# Patient Record
Sex: Male | Born: 1955 | Race: White | Hispanic: No | Marital: Married | Smoking: Former smoker
Health system: Southern US, Community
[De-identification: ages and names within clinical notes are randomized; demographics above are authoritative.]

## PROBLEM LIST (undated history)

## (undated) DIAGNOSIS — C4491 Basal cell carcinoma of skin, unspecified: Secondary | ICD-10-CM

## (undated) DIAGNOSIS — D62 Acute posthemorrhagic anemia: Secondary | ICD-10-CM

## (undated) DIAGNOSIS — I10 Essential (primary) hypertension: Secondary | ICD-10-CM

## (undated) DIAGNOSIS — M726 Necrotizing fasciitis: Secondary | ICD-10-CM

## (undated) DIAGNOSIS — K5903 Drug induced constipation: Secondary | ICD-10-CM

## (undated) DIAGNOSIS — G8918 Other acute postprocedural pain: Secondary | ICD-10-CM

## (undated) DIAGNOSIS — E871 Hypo-osmolality and hyponatremia: Secondary | ICD-10-CM

## (undated) DIAGNOSIS — L02415 Cutaneous abscess of right lower limb: Secondary | ICD-10-CM

## (undated) DIAGNOSIS — R7303 Prediabetes: Secondary | ICD-10-CM

## (undated) DIAGNOSIS — U071 COVID-19: Secondary | ICD-10-CM

## (undated) DIAGNOSIS — F329 Major depressive disorder, single episode, unspecified: Secondary | ICD-10-CM

## (undated) DIAGNOSIS — S78111A Complete traumatic amputation at level between right hip and knee, initial encounter: Secondary | ICD-10-CM

## (undated) HISTORY — DX: Other acute postprocedural pain: G89.18

## (undated) HISTORY — DX: Hypo-osmolality and hyponatremia: E87.1

## (undated) HISTORY — DX: Drug induced constipation: K59.03

## (undated) HISTORY — DX: Complete traumatic amputation at level between right hip and knee, initial encounter: S78.111A

## (undated) HISTORY — DX: COVID-19: U07.1

## (undated) HISTORY — DX: Necrotizing fasciitis: M72.6

## (undated) HISTORY — DX: Cutaneous abscess of right lower limb: L02.415

## (undated) HISTORY — DX: Prediabetes: R73.03

## (undated) HISTORY — DX: Acute posthemorrhagic anemia: D62

---

## 2006-07-04 ENCOUNTER — Emergency Department (HOSPITAL_COMMUNITY): Admission: EM | Admit: 2006-07-04 | Discharge: 2006-07-04 | Payer: Self-pay | Admitting: Emergency Medicine

## 2011-07-10 DIAGNOSIS — C44519 Basal cell carcinoma of skin of other part of trunk: Secondary | ICD-10-CM | POA: Insufficient documentation

## 2011-07-10 HISTORY — DX: Basal cell carcinoma of skin of other part of trunk: C44.519

## 2011-10-24 DIAGNOSIS — J309 Allergic rhinitis, unspecified: Secondary | ICD-10-CM | POA: Insufficient documentation

## 2011-10-24 HISTORY — DX: Allergic rhinitis, unspecified: J30.9

## 2012-07-15 DIAGNOSIS — Z85828 Personal history of other malignant neoplasm of skin: Secondary | ICD-10-CM

## 2012-07-15 HISTORY — DX: Personal history of other malignant neoplasm of skin: Z85.828

## 2014-08-23 DIAGNOSIS — E669 Obesity, unspecified: Secondary | ICD-10-CM

## 2014-08-23 DIAGNOSIS — R7301 Impaired fasting glucose: Secondary | ICD-10-CM

## 2014-08-23 HISTORY — DX: Impaired fasting glucose: R73.01

## 2014-08-23 HISTORY — DX: Obesity, unspecified: E66.9

## 2015-08-16 DIAGNOSIS — H903 Sensorineural hearing loss, bilateral: Secondary | ICD-10-CM | POA: Insufficient documentation

## 2015-08-16 DIAGNOSIS — I10 Essential (primary) hypertension: Secondary | ICD-10-CM

## 2015-08-16 HISTORY — DX: Sensorineural hearing loss, bilateral: H90.3

## 2015-08-16 HISTORY — DX: Essential (primary) hypertension: I10

## 2017-07-09 DIAGNOSIS — Z7689 Persons encountering health services in other specified circumstances: Secondary | ICD-10-CM

## 2017-07-09 HISTORY — DX: Persons encountering health services in other specified circumstances: Z76.89

## 2017-10-14 DIAGNOSIS — I10 Essential (primary) hypertension: Secondary | ICD-10-CM | POA: Diagnosis not present

## 2017-10-22 DIAGNOSIS — E860 Dehydration: Secondary | ICD-10-CM | POA: Diagnosis not present

## 2017-12-11 DIAGNOSIS — I10 Essential (primary) hypertension: Secondary | ICD-10-CM | POA: Diagnosis not present

## 2018-04-01 DIAGNOSIS — R7301 Impaired fasting glucose: Secondary | ICD-10-CM | POA: Diagnosis not present

## 2018-04-01 DIAGNOSIS — Z87891 Personal history of nicotine dependence: Secondary | ICD-10-CM | POA: Diagnosis not present

## 2018-04-01 DIAGNOSIS — I10 Essential (primary) hypertension: Secondary | ICD-10-CM | POA: Diagnosis not present

## 2018-08-13 DIAGNOSIS — H25013 Cortical age-related cataract, bilateral: Secondary | ICD-10-CM | POA: Diagnosis not present

## 2018-08-13 DIAGNOSIS — H2513 Age-related nuclear cataract, bilateral: Secondary | ICD-10-CM | POA: Diagnosis not present

## 2018-08-13 DIAGNOSIS — H35373 Puckering of macula, bilateral: Secondary | ICD-10-CM | POA: Diagnosis not present

## 2018-11-07 DIAGNOSIS — R079 Chest pain, unspecified: Secondary | ICD-10-CM | POA: Diagnosis not present

## 2018-11-08 ENCOUNTER — Emergency Department (HOSPITAL_BASED_OUTPATIENT_CLINIC_OR_DEPARTMENT_OTHER): Payer: BLUE CROSS/BLUE SHIELD

## 2018-11-08 ENCOUNTER — Other Ambulatory Visit: Payer: Self-pay

## 2018-11-08 ENCOUNTER — Observation Stay (HOSPITAL_BASED_OUTPATIENT_CLINIC_OR_DEPARTMENT_OTHER)
Admission: EM | Admit: 2018-11-08 | Discharge: 2018-11-09 | Disposition: A | Discharge status: Home / Self Care | Payer: BLUE CROSS/BLUE SHIELD | Attending: Internal Medicine | Admitting: Internal Medicine

## 2018-11-08 ENCOUNTER — Encounter (HOSPITAL_BASED_OUTPATIENT_CLINIC_OR_DEPARTMENT_OTHER): Payer: Self-pay | Admitting: *Deleted

## 2018-11-08 DIAGNOSIS — R079 Chest pain, unspecified: Secondary | ICD-10-CM | POA: Diagnosis not present

## 2018-11-08 DIAGNOSIS — Z87891 Personal history of nicotine dependence: Secondary | ICD-10-CM | POA: Diagnosis not present

## 2018-11-08 DIAGNOSIS — R072 Precordial pain: Secondary | ICD-10-CM | POA: Diagnosis not present

## 2018-11-08 DIAGNOSIS — R0789 Other chest pain: Secondary | ICD-10-CM | POA: Diagnosis not present

## 2018-11-08 DIAGNOSIS — I10 Essential (primary) hypertension: Secondary | ICD-10-CM | POA: Diagnosis not present

## 2018-11-08 HISTORY — DX: Basal cell carcinoma of skin, unspecified: C44.91

## 2018-11-08 HISTORY — DX: Chest pain, unspecified: R07.9

## 2018-11-08 HISTORY — DX: Essential (primary) hypertension: I10

## 2018-11-08 LAB — CBC
HCT: 40.6 % (ref 39.0–52.0)
HCT: 39.9 % (ref 39.0–52.0)
Hemoglobin: 13.5 g/dL (ref 13.0–17.0)
Hemoglobin: 13.4 g/dL (ref 13.0–17.0)
MCH: 29.3 pg (ref 26.0–34.0)
MCH: 29.3 pg (ref 26.0–34.0)
MCHC: 33.3 g/dL (ref 30.0–36.0)
MCHC: 33.6 g/dL (ref 30.0–36.0)
MCV: 88.3 fL (ref 80.0–100.0)
MCV: 87.3 fL (ref 80.0–100.0)
Platelets: 242 10*3/uL (ref 150–400)
Platelets: 251 10*3/uL (ref 150–400)
RBC: 4.6 MIL/uL (ref 4.22–5.81)
RBC: 4.57 MIL/uL (ref 4.22–5.81)
RDW: 13.1 % (ref 11.5–15.5)
RDW: 13.2 % (ref 11.5–15.5)
WBC: 7.8 10*3/uL (ref 4.0–10.5)
WBC: 5.9 10*3/uL (ref 4.0–10.5)
nRBC: 0 % (ref 0.0–0.2)
nRBC: 0 % (ref 0.0–0.2)

## 2018-11-08 LAB — LIPID PANEL
Cholesterol: 178 mg/dL (ref 0–200)
HDL: 40 mg/dL — ABNORMAL LOW (ref >40)
LDL Cholesterol: 108 mg/dL — ABNORMAL HIGH (ref 0–99)
Total CHOL/HDL Ratio: 4.5 RATIO
Triglycerides: 152 mg/dL — ABNORMAL HIGH (ref <150)
VLDL: 30 mg/dL (ref 0–40)

## 2018-11-08 LAB — TROPONIN I
Troponin I: <0.03 ng/mL (ref <0.03)
Troponin I: <0.03 ng/mL (ref <0.03)
Troponin I: <0.03 ng/mL (ref <0.03)
Troponin I: <0.03 ng/mL (ref <0.03)

## 2018-11-08 LAB — BASIC METABOLIC PANEL
Anion gap: 7 (ref 5–15)
BUN: 14 mg/dL (ref 6–20)
CO2: 22 mmol/L (ref 22–32)
Calcium: 8.7 mg/dL — ABNORMAL LOW (ref 8.9–10.3)
Chloride: 107 mmol/L (ref 98–111)
Creatinine, Ser: 0.94 mg/dL (ref 0.61–1.24)
GFR calc Af Amer: >60 mL/min (ref >60)
GFR calc non Af Amer: >60 mL/min (ref >60)
Glucose, Bld: 116 mg/dL — ABNORMAL HIGH (ref 70–99)
Potassium: 3.5 mmol/L (ref 3.5–5.1)
Sodium: 136 mmol/L (ref 135–145)

## 2018-11-08 LAB — HEMOGLOBIN A1C
Hgb A1c MFr Bld: 5.9 % — ABNORMAL HIGH (ref 4.8–5.6)
Mean Plasma Glucose: 122.63 mg/dL

## 2018-11-08 LAB — MRSA PCR SCREENING: MRSA by PCR: NEGATIVE

## 2018-11-08 MED ORDER — MORPHINE SULFATE (PF) 2 MG/ML IV SOLN: 2.0000 mg | INTRAVENOUS | Status: DC | PRN

## 2018-11-08 MED ORDER — ASPIRIN 81 MG PO CHEW
81.0000 mg | CHEWABLE_TABLET | Freq: Every day | ORAL | Status: DC
  Administered 2018-11-09: 81 mg via ORAL
  Filled 2018-11-08: qty 1

## 2018-11-08 MED ORDER — SODIUM CHLORIDE 0.9% FLUSH
3.0000 mL | Freq: Once | INTRAVENOUS | Status: DC
  Filled 2018-11-08: qty 3

## 2018-11-08 MED ORDER — NITROGLYCERIN 0.4 MG SL SUBL
0.4000 mg | SUBLINGUAL_TABLET | SUBLINGUAL | Status: AC | PRN
  Administered 2018-11-08 (×3): 0.4 mg via SUBLINGUAL
  Filled 2018-11-08: qty 1

## 2018-11-08 MED ORDER — NITROGLYCERIN IN D5W 200-5 MCG/ML-% IV SOLN: 0.0000 ug/min | INTRAVENOUS | Status: DC

## 2018-11-08 MED ORDER — ENOXAPARIN SODIUM 40 MG/0.4ML ~~LOC~~ SOLN
40.0000 mg | SUBCUTANEOUS | Status: DC
  Administered 2018-11-08: 40 mg via SUBCUTANEOUS
  Filled 2018-11-08 (×2): qty 0.4

## 2018-11-08 MED ORDER — MORPHINE SULFATE (PF) 4 MG/ML IV SOLN
4.0000 mg | Freq: Once | INTRAVENOUS | Status: AC
  Administered 2018-11-08: 4 mg via INTRAVENOUS
  Filled 2018-11-08: qty 1

## 2018-11-08 MED ORDER — ONDANSETRON HCL 4 MG/2ML IJ SOLN: 4.0000 mg | Freq: Four times a day (QID) | INTRAMUSCULAR | Status: DC | PRN

## 2018-11-08 MED ORDER — ACETAMINOPHEN 325 MG PO TABS: 650.0000 mg | ORAL_TABLET | ORAL | Status: DC | PRN

## 2018-11-08 MED ORDER — LOSARTAN POTASSIUM 50 MG PO TABS
50.0000 mg | ORAL_TABLET | Freq: Every day | ORAL | Status: DC
  Administered 2018-11-08 – 2018-11-09 (×2): 50 mg via ORAL
  Filled 2018-11-08 (×2): qty 1

## 2018-11-08 MED ORDER — ASPIRIN 81 MG PO CHEW
324.0000 mg | CHEWABLE_TABLET | Freq: Once | ORAL | Status: AC
Start: 2018-11-08 — End: 2018-11-08
  Administered 2018-11-08: 324 mg via ORAL
  Filled 2018-11-08: qty 4

## 2018-11-08 MED ORDER — HYDRALAZINE HCL 10 MG PO TABS: 10.0000 mg | ORAL_TABLET | Freq: Four times a day (QID) | ORAL | Status: DC | PRN

## 2018-11-08 NOTE — Plan of Care (Signed)

## 2018-11-08 NOTE — Progress Notes (Signed)
New pt transferred from 2 H in a stable condition, tele-monitor resumed, admission history completed, initial assessment done, pt does not have any specific complain this time, denies chest pain and distress, ambulatory, NSR, BP maintained, aware of the hospital safety protocol, will continue to monitor the patient  Palma Holter, RN

## 2018-11-08 NOTE — ED Triage Notes (Signed)
Chest pain x 2 hours. Endorses dizziness and feeling clammy

## 2018-11-08 NOTE — ED Provider Notes (Signed)
[facility] EMERGENCY DEPARTMENT Provider Note   CSN: [id number] Arrival date & time: 11/08/18  0041    History   Chief Complaint Chief Complaint  Patient presents with  . Chest Pain    HPI Andres [name] is a 62 y.o. male.     Patient presents with central chest pain and pressure and "weight on my chest" that onset about 10:30 PM when he went to lie down.  States he had no pain prior to this.  The pain is been constant and progressively worsening.  It does not radiate to his arm, neck or back.  There is some associated shortness of breath, nausea and clamminess.  No diaphoresis.  He is never had this kind of pain in the past.  Denies any cardiac history.  No history of ulcers or acid reflux.  No abdominal pain or back pain.  States he is no longer on blood pressure medication because his blood pressure had improved and takes no medications currently.  Last stress test was about 15 years ago.  The history is provided by the patient and the spouse.  Chest Pain  Associated symptoms: nausea and shortness of breath   Associated symptoms: no abdominal pain, no dizziness, no fever, no headache, no vomiting and no weakness     Past Medical History:  Diagnosis Date  . Basal cell carcinoma   . Hypertension     There are no active problems to display for this patient.   History reviewed. No pertinent surgical history.      Home Medications    Prior to Admission medications   Not on File    Family History No family history on file.  Social History Social History   Tobacco Use  . Smoking status: Former Research scientist (life sciences)  . Smokeless tobacco: Never Used  Substance Use Topics  . Alcohol use: Not Currently    Frequency: Never  . Drug use: Never     Allergies   Patient has no known allergies.   Review of Systems Review of Systems  Constitutional: Negative for activity change, appetite change and fever.  HENT: Negative for congestion and rhinorrhea.     Respiratory: Positive for chest tightness and shortness of breath.   Cardiovascular: Positive for chest pain.  Gastrointestinal: Positive for nausea. Negative for abdominal pain and vomiting.  Genitourinary: Negative for dysuria and hematuria.  Musculoskeletal: Negative for arthralgias and myalgias.  Skin: Negative for rash.  Neurological: Negative for dizziness, weakness and headaches.   all other systems are negative except as noted in the HPI and PMH.     Physical Exam Updated Vital Signs BP (!) 201/105 (BP Location: Left Arm)   Pulse 84   Temp 97.6 F (36.4 C) (Oral)   Resp 16   Ht 5\' 11"  (1.803 m)   Wt 98.9 kg   SpO2 100%   BMI 30.40 kg/m   Physical Exam Vitals signs and nursing note reviewed.  Constitutional:      General: He is not in acute distress.    Appearance: He is well-developed.  HENT:     Head: Normocephalic and atraumatic.     Mouth/Throat:     Pharynx: No oropharyngeal exudate.  Eyes:     Conjunctiva/sclera: Conjunctivae normal.     Pupils: Pupils are equal, round, and reactive to light.  Neck:     Musculoskeletal: Normal range of motion and neck supple.     Comments: No meningismus. Cardiovascular:     Rate and Rhythm: Normal  rate and regular rhythm.     Heart sounds: Normal heart sounds. No murmur.     Comments: Equal radial pulses and grip strengths Pulmonary:     Effort: Pulmonary effort is normal. No respiratory distress.     Breath sounds: Normal breath sounds.  Abdominal:     Palpations: Abdomen is soft.     Tenderness: There is no abdominal tenderness. There is no guarding or rebound.  Musculoskeletal: Normal range of motion.        General: No tenderness.  Skin:    General: Skin is warm.     Capillary Refill: Capillary refill takes less than 2 seconds.  Neurological:     General: No focal deficit present.     Mental Status: He is alert and oriented to person, place, and time. Mental status is at baseline.     Cranial Nerves: No  cranial nerve deficit.     Motor: No abnormal muscle tone.     Coordination: Coordination normal.     Comments:  5/5 strength throughout. CN 2-12 intact.Equal grip strength.   Psychiatric:        Behavior: Behavior normal.      ED Treatments / Results  Labs (all labs ordered are listed, but only abnormal results are displayed) Labs Reviewed  BASIC METABOLIC PANEL - Abnormal; Notable for the following components:      Result Value   Glucose, Bld 116 (*)    Calcium 8.7 (*)    All other components within normal limits  CBC  TROPONIN I  TROPONIN I  TROPONIN I    EKG EKG Interpretation  Date/Time:  Sunday November 08 2018 00:48:38 EST Ventricular Rate:  66 PR Interval:  172 QRS Duration: 88 QT Interval:  386 QTC Calculation: 404 R Axis:   1 Text Interpretation:  Normal sinus rhythm Normal ECG No significant change was found Confirmed by Ezequiel Essex [phone]) on 11/08/2018 12:51:54 AM   Radiology Dg Chest 2 View  Result Date: 11/08/2018 CLINICAL DATA:  Chest pain. EXAM: CHEST - 2 VIEW COMPARISON:  07/04/2006 FINDINGS: The cardiomediastinal contours are normal. The lungs are clear. Pulmonary vasculature is normal. No consolidation, pleural effusion, or pneumothorax. No acute osseous abnormalities are seen. Calcified granuloma at the right lung base versus hepatic granuloma. IMPRESSION: No acute chest findings. Electronically Signed   By: Keith Rake M.D.   On: 11/08/2018 01:13    Procedures Procedures (including critical care time)  Medications Ordered in ED Medications  sodium chloride flush (NS) 0.9 % injection 3 mL (has no administration in time range)  aspirin chewable tablet 324 mg (has no administration in time range)  nitroGLYCERIN (NITROSTAT) SL tablet 0.4 mg (has no administration in time range)     Initial Impression / Assessment and Plan / ED Course  I have reviewed the triage vital signs and the nursing notes.  Pertinent labs & imaging results that  were available during my care of the patient were reviewed by me and considered in my medical decision making (see chart for details).       Sudden onset of left-sided chest pressure with nausea, clamminess and shortness of breath.  Concern for ACS.  EKG is sinus rhythm without acute ST changes. Patient very hypertensive and states his blood pressure is normally well controlled.  Will check bilateral blood pressures, give aspirin and nitroglycerin.  Initial troponin negative.  Blood pressure has improved with nitroglycerin. Primary care documentation reviewed which states patient's blood pressure has been uncontrolled  in the past.  Chest pain has resolved with sublingual nitroglycerin and morphine.  Will hold nitroglycerin drip at this time.  Concern for angina.  Pulmonary embolism, aortic dissection considered less likely at this time. Equal upper extremity blood pressures, grip strength and pulses.  Patient will be admitted for further evaluation of his chest pain with concern for angina.  Discussed with Dr. open at [facility] who accepts patient.  CRITICAL CARE Performed by: Ezequiel Essex Total critical care time: 35 minutes Critical care time was exclusive of separately billable procedures and treating other patients. Critical care was necessary to treat or prevent imminent or life-threatening deterioration. Critical care was time spent personally by me on the following activities: development of treatment plan with patient and/or surrogate as well as nursing, discussions with consultants, evaluation of patient's response to treatment, examination of patient, obtaining history from patient or surrogate, ordering and performing treatments and interventions, ordering and review of laboratory studies, ordering and review of radiographic studies, pulse oximetry and re-evaluation of patient's condition.  Final Clinical Impressions(s) / ED Diagnoses   Final diagnoses:  Chest  pain, unspecified type    ED Discharge Orders    None       Voris Tigert, Annie Main, MD 11/08/18 [phone]

## 2018-11-08 NOTE — ED Notes (Signed)
Pt states pain was constant at home, describes it now as intermittent. States pain came on around 2230 as he was trying to lay down to go to bed, reports associated dizziness, L arm tingling (that he is still having), SHOB and feeling "clammy".

## 2018-11-08 NOTE — ED Notes (Signed)
carelink gone with patient

## 2018-11-08 NOTE — ED Notes (Signed)
Chest pain relieved after Morphine administration. Pt instructed to notify RN if pain returns. EDP instructed to hold Nitro drip for now.

## 2018-11-08 NOTE — ED Notes (Signed)
Report to Lennox Grumbles, RN via phone at [facility]

## 2018-11-08 NOTE — H&P (Signed)
History and Physical    Andres [name] HEN:[id number] DOB: 08-24-1956 DOA: 11/08/2018  PCP: [name], MD  Patient coming from: Home.  Now transferred from outside emergency room.  I have personally briefly reviewed patient's old medical records available.   Chief Complaint: Chest pain.  HPI: Andres [name] is a 63 y.o. male with medical history significant of poorly controlled high blood pressures currently trying to control with diet and exercise but no other major medical problems presenting to the emergency room with sudden onset chest pain.  Started about 10:30 PM last night when he went to bed, left precordial, severe in intensity, constant and progressively worsening without radiation.  He had some associated shortness of breath and nausea but no other symptoms.  No headache, dizziness, syncopal episodes.  He called EMS and was transferred to ED.  Denies any cough cold, flulike symptoms, previous chest pain, history of heart problems or family history of cardiac death. Patient used to be on losartan 25 mg, recently stopped taking because he is determined to control his blood pressures with diet and exercise.  He has variable blood pressures at his primary care physician's office. ED Course: On arrival to emergency room last night, blood pressures 201/105, had vague chest pain that improved with aspirin and nitroglycerin.  2 sets of troponins are negative since then.  Arrival EKG was without ST changes.  Patient was planned to start on nitroglycerin drip, however his blood pressures fairly improved with sublingual nitroglycerin and aspirin, his chest pain improved. Patient was examined immediately on arrival to hospital, his blood pressures are 152/82.  Patient is without any chest pain.  A bedside EKG was done and reviewed by me that shows no ST-T wave changes.  Comparable to last night's EKG.  Review of Systems: As per HPI otherwise 10 point review of systems negative.    Past  Medical History:  Diagnosis Date  . Basal cell carcinoma   . Hypertension     History reviewed. No pertinent surgical history.   reports that he has quit smoking. He has never used smokeless tobacco. He reports previous alcohol use. He reports that he does not use drugs.  No Known Allergies  History reviewed. No pertinent family history.  Patient denies any family history of coronary artery disease, stroke.   Prior to Admission medications   Not on File  Losartan 25 mg was prescribed but he never taken it.  Physical Exam: Vitals:   11/08/18 0600 11/08/18 0630 11/08/18 0700 11/08/18 0730  BP: 139/80 (!) 145/90 (!) 150/81 (!) 152/82  Pulse: (!) 53 (!) 52 (!) 58 (!) 57  Resp: 14 14 16 12   Temp:      TempSrc:      SpO2: 96% 98% 95% 99%  Weight:      Height:        Constitutional: NAD, calm, comfortable Vitals:   11/08/18 0600 11/08/18 0630 11/08/18 0700 11/08/18 0730  BP: 139/80 (!) 145/90 (!) 150/81 (!) 152/82  Pulse: (!) 53 (!) 52 (!) 58 (!) 57  Resp: 14 14 16 12   Temp:      TempSrc:      SpO2: 96% 98% 95% 99%  Weight:      Height:       Eyes: PERRL, lids and conjunctivae normal ENMT: Mucous membranes are moist. Posterior pharynx clear of any exudate or lesions.Normal dentition.  Neck: normal, supple, no masses, no thyromegaly Respiratory: clear to auscultation bilaterally, no wheezing, no crackles. Normal respiratory effort.  No accessory muscle use.  Cardiovascular: Regular rate and rhythm, no murmurs / rubs / gallops. No extremity edema. 2+ pedal pulses. No carotid bruits.  Abdomen: no tenderness, no masses palpated. No hepatosplenomegaly. Bowel sounds positive.  Musculoskeletal: no clubbing / cyanosis. No joint deformity upper and lower extremities. Good ROM, no contractures. Normal muscle tone.  Skin: no rashes, lesions, ulcers. No induration Neurologic: CN 2-12 grossly intact. Sensation intact, DTR normal. Strength 5/5 in all 4.  Psychiatric: Normal judgment  and insight. Alert and oriented x 3. Normal mood.     Labs on Admission: I have personally reviewed following labs and imaging studies  CBC: Recent Labs  Lab 11/08/18 0101  WBC 7.8  HGB 13.5  HCT 40.6  MCV 88.3  PLT 229   Basic Metabolic Panel: Recent Labs  Lab 11/08/18 0101  NA 136  K 3.5  CL 107  CO2 22  GLUCOSE 116*  BUN 14  CREATININE 0.94  CALCIUM 8.7*   GFR: Estimated Creatinine Clearance: 100.1 mL/min (by C-G formula based on SCr of 0.94 mg/dL). Liver Function Tests: No results for input(s): AST, ALT, ALKPHOS, BILITOT, PROT, ALBUMIN in the last 168 hours. No results for input(s): LIPASE, AMYLASE in the last 168 hours. No results for input(s): AMMONIA in the last 168 hours. Coagulation Profile: No results for input(s): INR, PROTIME in the last 168 hours. Cardiac Enzymes: Recent Labs  Lab 11/08/18 0101 11/08/18 0449  TROPONINI <0.03 <0.03   BNP (last 3 results) No results for input(s): PROBNP in the last 8760 hours. HbA1C: No results for input(s): HGBA1C in the last 72 hours. CBG: No results for input(s): GLUCAP in the last 168 hours. Lipid Profile: No results for input(s): CHOL, HDL, LDLCALC, TRIG, CHOLHDL, LDLDIRECT in the last 72 hours. Thyroid Function Tests: No results for input(s): TSH, T4TOTAL, FREET4, T3FREE, THYROIDAB in the last 72 hours. Anemia Panel: No results for input(s): VITAMINB12, FOLATE, FERRITIN, TIBC, IRON, RETICCTPCT in the last 72 hours. Urine analysis: No results found for: COLORURINE, APPEARANCEUR, LABSPEC, PHURINE, GLUCOSEU, HGBUR, BILIRUBINUR, KETONESUR, PROTEINUR, UROBILINOGEN, NITRITE, LEUKOCYTESUR  Radiological Exams on Admission: Dg Chest 2 View  Result Date: 11/08/2018 CLINICAL DATA:  Chest pain. EXAM: CHEST - 2 VIEW COMPARISON:  07/04/2006 FINDINGS: The cardiomediastinal contours are normal. The lungs are clear. Pulmonary vasculature is normal. No consolidation, pleural effusion, or pneumothorax. No acute osseous  abnormalities are seen. Calcified granuloma at the right lung base versus hepatic granuloma. IMPRESSION: No acute chest findings. Electronically Signed   By: Keith Rake M.D.   On: 11/08/2018 01:13    EKG: Independently reviewed.  Normal sinus rhythm.  2 subsequent EKG with no ST-T wave changes.  Assessment/Plan Principal Problem:   Chest pain Active Problems:   Accelerated hypertension     1.  Chest pain: We will admit patient to the telemetry unit given severity of symptoms. Currently chest pain improved Cycle EKG and troponins. Supplemental oxygen to keep saturations more than 90%. Aspirin was given in the ER, will continue once a day aspirin. Nitroglycerin and Morphine for severe and recurrent pain. Will check A1c and fasting lipid panel for risk factor stratification. Will rule out acute coronary syndrome, heart score is 4.  Will refer for outpatient stress test if remains asymptomatic in the hospital next 24 hours.  2.  Hypertension accelerated: Blood pressures were high.  He does have poorly controlled blood pressures, however might have been aggravated by presence of pain.  I discussed with patient, will resume his losartan  with increased dose of 50 mg daily.  We will keep him on as needed antihypertensives in the hospital to keep blood pressures less than 180.  We discussed about diet exercise and lifestyle modification which patient is already trying to achieve. Given fluctuating blood pressures and episodes of very high blood pressures, he will benefit with being on blood pressure regimen and he is agreeable to it.   DVT prophylaxis: Lovenox. Code Status: Full code. Family Communication: No family at bedside. Disposition Plan: Home when is stable. Consults called: None. Admission status: Observation.   Barb Merino MD [facility] Pager [phone]  If 7PM-7AM, please contact night-coverage [email or web address] Password [facility]  11/08/2018, 9:41 AM

## 2018-11-09 ENCOUNTER — Ambulatory Visit (HOSPITAL_BASED_OUTPATIENT_CLINIC_OR_DEPARTMENT_OTHER): Payer: BLUE CROSS/BLUE SHIELD

## 2018-11-09 DIAGNOSIS — R079 Chest pain, unspecified: Secondary | ICD-10-CM

## 2018-11-09 DIAGNOSIS — I10 Essential (primary) hypertension: Secondary | ICD-10-CM | POA: Diagnosis not present

## 2018-11-09 DIAGNOSIS — Z87891 Personal history of nicotine dependence: Secondary | ICD-10-CM | POA: Diagnosis not present

## 2018-11-09 DIAGNOSIS — R0789 Other chest pain: Secondary | ICD-10-CM | POA: Diagnosis not present

## 2018-11-09 LAB — BASIC METABOLIC PANEL
Anion gap: 10 (ref 5–15)
BUN: 14 mg/dL (ref 6–20)
CO2: 22 mmol/L (ref 22–32)
Calcium: 9.1 mg/dL (ref 8.9–10.3)
Chloride: 106 mmol/L (ref 98–111)
Creatinine, Ser: 0.99 mg/dL (ref 0.61–1.24)
GFR calc Af Amer: >60 mL/min (ref >60)
GFR calc non Af Amer: >60 mL/min (ref >60)
Glucose, Bld: 121 mg/dL — ABNORMAL HIGH (ref 70–99)
Potassium: 3.9 mmol/L (ref 3.5–5.1)
Sodium: 138 mmol/L (ref 135–145)

## 2018-11-09 LAB — CBC WITH DIFFERENTIAL/PLATELET
Abs Immature Granulocytes: 0.02 10*3/uL (ref 0.00–0.07)
Basophils Absolute: 0.1 10*3/uL (ref 0.0–0.1)
Basophils Relative: 1 %
Eosinophils Absolute: 0.3 10*3/uL (ref 0.0–0.5)
Eosinophils Relative: 4 %
HCT: 43.5 % (ref 39.0–52.0)
Hemoglobin: 14.7 g/dL (ref 13.0–17.0)
Immature Granulocytes: 0 %
Lymphocytes Relative: 35 %
Lymphs Abs: 2.4 10*3/uL (ref 0.7–4.0)
MCH: 29.5 pg (ref 26.0–34.0)
MCHC: 33.8 g/dL (ref 30.0–36.0)
MCV: 87.2 fL (ref 80.0–100.0)
Monocytes Absolute: 0.4 10*3/uL (ref 0.1–1.0)
Monocytes Relative: 6 %
Neutro Abs: 3.6 10*3/uL (ref 1.7–7.7)
Neutrophils Relative %: 54 %
Platelets: 268 10*3/uL (ref 150–400)
RBC: 4.99 MIL/uL (ref 4.22–5.81)
RDW: 13.2 % (ref 11.5–15.5)
WBC: 6.7 10*3/uL (ref 4.0–10.5)
nRBC: 0 % (ref 0.0–0.2)

## 2018-11-09 LAB — ECHOCARDIOGRAM COMPLETE
Height: 71 in
Weight: 3922.42 oz

## 2018-11-09 LAB — TROPONIN I: Troponin I: <0.03 ng/mL (ref <0.03)

## 2018-11-09 MED ORDER — ASPIRIN 81 MG PO CHEW: 81.0000 mg | CHEWABLE_TABLET | Freq: Every day | ORAL | Status: DC

## 2018-11-09 MED ORDER — LOSARTAN POTASSIUM 50 MG PO TABS: 50.0000 mg | ORAL_TABLET | Freq: Every day | ORAL | 0 refills | Status: DC

## 2018-11-09 NOTE — Progress Notes (Signed)
PROGRESS NOTE    Andres [name]  RJJ:[id number] DOB: April 07, 1956 DOA: 11/08/2018 PCP: [name], MD   Brief Narrative:  Per Dr. Lillia Mountain Andres [name] is a 63 y.o. male with medical history significant of poorly controlled high blood pressures currently trying to control with diet and exercise but no other major medical problems presenting to the emergency room with sudden onset chest pain.  Started about 10:30 PM last night when he went to bed, left precordial, severe in intensity, constant and progressively worsening without radiation.  He had some associated shortness of breath and nausea but no other symptoms.  No headache, dizziness, syncopal episodes.  He called EMS and was transferred to ED.  Denies any cough cold, flulike symptoms, previous chest pain, history of heart problems or family history of cardiac death. Patient used to be on losartan 25 mg, recently stopped taking because he is determined to control his blood pressures with diet and exercise.  He has variable blood pressures at his primary care physician's office. ED Course: On arrival to emergency room last night, blood pressures 201/105, had vague chest pain that improved with aspirin and nitroglycerin.  2 sets of troponins are negative since then.  Arrival EKG was without ST changes.  Patient was planned to start on nitroglycerin drip, however his blood pressures fairly improved with sublingual nitroglycerin and aspirin, his chest pain improved. Patient was examined immediately on arrival to hospital, his blood pressures are 152/82.  Patient is without any chest pain.  A bedside EKG was done and reviewed by me that shows no ST-T wave changes.  Comparable to last night's EKG.  Assessment & Plan:   Principal Problem:   Chest pain Active Problems:   Accelerated hypertension  1 chest pain Possible etiology.  Likely secondary to accelerated hypertension.  Patient currently chest pain-free.  EKG done with no ischemic  changes noted.  Cardiac enzymes negative x3.  With LDL of 108.  Blood pressure improved on current regimen of Cozaar.  Continue aspirin.  Check a 2D echo.  If 2D echo is normal and patient with no further chest pain could likely follow-up with cardiology in the outpatient setting for outpatient stress test.  2.  Accelerated hypertension On admission patient noted to have a significantly elevated blood pressure of 201/105 on presentation to the ED.  EKG with no ST changes.  Patient noted to be trying to manage his blood pressure with diet and exercise and has been off his antihypertensive medications for over a month.  Patient placed back on losartan on the increased dose of 50 mg daily.  Blood pressure improved.  Outpatient follow-up with PCP.  DVT prophylaxis: Lovenox Code Status: Full Family Communication: Updated patient and wife at bedside. Disposition Plan: Likely home if 2D echo is normal no further chest pain with outpatient follow-up with cardiology for stress test.   Consultants:   None  Procedures:   2D echo pending  Antimicrobials:   None   Subjective: Patient laying in bed.  Denies any chest pain.  No shortness of breath.  States he feels great.  Asking whether he can go home today.  Objective: Vitals:   11/08/18 2300 11/09/18 0100 11/09/18 0300 11/09/18 0829  BP: (!) 156/95  (!) 161/84 (!) 142/83  Pulse: 68  69 72  Resp: 12 20 12 15   Temp: 98 F (36.7 C)  97.7 F (36.5 C) 97.6 F (36.4 C)  TempSrc: Oral  Oral Oral  SpO2: 98%  98%   Weight:  Height:        Intake/Output Summary (Last 24 hours) at 11/09/2018 0930 Last data filed at 11/08/2018 1800 Gross per 24 hour  Intake 480 ml  Output 600 ml  Net -120 ml   Filed Weights   11/08/18 0048 11/08/18 1518  Weight: 98.9 kg 111.2 kg    Examination:  General exam: Appears calm and comfortable  Respiratory system: Clear to auscultation. Respiratory effort normal. Cardiovascular system: S1 & S2 heard,  RRR. No JVD, murmurs, rubs, gallops or clicks. No pedal edema. Gastrointestinal system: Abdomen is nondistended, soft and nontender. No organomegaly or masses felt. Normal bowel sounds heard. Central nervous system: Alert and oriented. No focal neurological deficits. Extremities: Symmetric 5 x 5 power. Skin: No rashes, lesions or ulcers Psychiatry: Judgement and insight appear normal. Mood & affect appropriate.     Data Reviewed: I have personally reviewed following labs and imaging studies  CBC: Recent Labs  Lab 11/08/18 0101 11/08/18 1258 11/09/18 0748  WBC 7.8 5.9 6.7  NEUTROABS  --   --  3.6  HGB 13.5 13.4 14.7  HCT 40.6 39.9 43.5  MCV 88.3 87.3 87.2  PLT 242 251 706   Basic Metabolic Panel: Recent Labs  Lab 11/08/18 0101 11/09/18 0748  NA 136 138  K 3.5 3.9  CL 107 106  CO2 22 22  GLUCOSE 116* 121*  BUN 14 14  CREATININE 0.94 0.99  CALCIUM 8.7* 9.1   GFR: Estimated Creatinine Clearance: 100.7 mL/min (by C-G formula based on SCr of 0.99 mg/dL). Liver Function Tests: No results for input(s): AST, ALT, ALKPHOS, BILITOT, PROT, ALBUMIN in the last 168 hours. No results for input(s): LIPASE, AMYLASE in the last 168 hours. No results for input(s): AMMONIA in the last 168 hours. Coagulation Profile: No results for input(s): INR, PROTIME in the last 168 hours. Cardiac Enzymes: Recent Labs  Lab 11/08/18 0101 11/08/18 0449 11/08/18 1258 11/08/18 1837 11/09/18 0031  TROPONINI <0.03 <0.03 <0.03 <0.03 <0.03   BNP (last 3 results) No results for input(s): PROBNP in the last 8760 hours. HbA1C: Recent Labs    11/08/18 1258  HGBA1C 5.9*   CBG: No results for input(s): GLUCAP in the last 168 hours. Lipid Profile: Recent Labs    11/08/18 1258  CHOL 178  HDL 40*  LDLCALC 108*  TRIG 152*  CHOLHDL 4.5   Thyroid Function Tests: No results for input(s): TSH, T4TOTAL, FREET4, T3FREE, THYROIDAB in the last 72 hours. Anemia Panel: No results for input(s):  VITAMINB12, FOLATE, FERRITIN, TIBC, IRON, RETICCTPCT in the last 72 hours. Sepsis Labs: No results for input(s): PROCALCITON, LATICACIDVEN in the last 168 hours.  Recent Results (from the past 240 hour(s))  MRSA PCR Screening     Status: None   Collection Time: 11/08/18  9:10 AM  Result Value Ref Range Status   MRSA by PCR NEGATIVE NEGATIVE Final    Comment:        The GeneXpert MRSA Assay (FDA approved for NASAL specimens only), is one component of a comprehensive MRSA colonization surveillance program. It is not intended to diagnose MRSA infection nor to guide or monitor treatment for MRSA infections. Performed at [facility], [place] [address] [place], [place] [address]          Radiology Studies: Dg Chest 2 View  Result Date: 11/08/2018 CLINICAL DATA:  Chest pain. EXAM: CHEST - 2 VIEW COMPARISON:  07/04/2006 FINDINGS: The cardiomediastinal contours are normal. The lungs are clear. Pulmonary vasculature is normal.  No consolidation, pleural effusion, or pneumothorax. No acute osseous abnormalities are seen. Calcified granuloma at the right lung base versus hepatic granuloma. IMPRESSION: No acute chest findings. Electronically Signed   By: Keith Rake M.D.   On: 11/08/2018 01:13        Scheduled Meds: . aspirin  81 mg Oral Daily  . enoxaparin (LOVENOX) injection  40 mg Subcutaneous Q24H  . losartan  50 mg Oral Daily  . sodium chloride flush  3 mL Intravenous Once   Continuous Infusions:   LOS: 0 days    Time spent: 35 minutes    Irine Seal, MD [facility]  If 7PM-7AM, please contact night-coverage [email or web address] 11/09/2018, 9:30 AM

## 2018-11-09 NOTE — Progress Notes (Signed)
Discharge instructions given. Pt verbalized understanding and all questions were answered.  

## 2018-11-09 NOTE — Progress Notes (Signed)
  Echocardiogram 2D Echocardiogram has been performed.  Andres [name] 11/09/2018, 11:16 AM

## 2018-11-09 NOTE — Plan of Care (Signed)
  Problem: Health Behavior/Discharge Planning: Goal: Ability to manage health-related needs will improve Outcome: Completed/Met   Problem: Clinical Measurements: Goal: Ability to maintain clinical measurements within normal limits will improve Outcome: Completed/Met Goal: Will remain free from infection Outcome: Completed/Met Goal: Diagnostic test results will improve Outcome: Adequate for Discharge Goal: Respiratory complications will improve Outcome: Not Applicable Goal: Cardiovascular complication will be avoided Outcome: Adequate for Discharge   Problem: Activity: Goal: Risk for activity intolerance will decrease Outcome: Completed/Met   Problem: Nutrition: Goal: Adequate nutrition will be maintained Outcome: Completed/Met   Problem: Coping: Goal: Level of anxiety will decrease Outcome: Not Applicable   Problem: Elimination: Goal: Will not experience complications related to bowel motility Outcome: Completed/Met Goal: Will not experience complications related to urinary retention Outcome: Completed/Met   Problem: Pain Managment: Goal: General experience of comfort will improve Outcome: Adequate for Discharge   Problem: Safety: Goal: Ability to remain free from injury will improve Outcome: Completed/Met   Problem: Skin Integrity: Goal: Risk for impaired skin integrity will decrease Outcome: Completed/Met  VSS. Completed ECHO, awaiting results for possibly DC today. Spouse at bedside, pt pain free. Will continue to monitor until time of DC.

## 2018-11-09 NOTE — Discharge Summary (Signed)
Physician Discharge Summary  Andres [name] XTG:[id number] DOB: July 06, 1956 DOA: 11/08/2018  PCP: [name], MD  Admit date: 11/08/2018 Discharge date: 11/09/2018  Time spent: 50 minutes  Recommendations for Outpatient Follow-up:  1. Follow-up with [name], MD in 2 weeks.  On follow-up patient's blood pressure will need to be reassessed. 2. Follow-up with Dr. [name], cardiology on 11/18/2018 for probable outpatient stress test and further evaluation of chest pain.   Discharge Diagnoses:  Principal Problem:   Chest pain Active Problems:   Accelerated hypertension   Discharge Condition: Stable and improved  Diet recommendation: Heart healthy  Filed Weights   11/08/18 0048 11/08/18 1518  Weight: 98.9 kg 111.2 kg    History of present illness:  Per Dr. Lily Lovings [name] is a 63 y.o. male with medical history significant of poorly controlled high blood pressures currently trying to control with diet and exercise but no other major medical problems presented to the emergency room with sudden onset chest pain.  Started about 10:30 PM last night when he went to bed, left precordial, severe in intensity, constant and progressively worsening without radiation.  He had some associated shortness of breath and nausea but no other symptoms.  No headache, dizziness, syncopal episodes.  He called EMS and was transferred to ED.  Denies any cough cold, flulike symptoms, previous chest pain, history of heart problems or family history of cardiac death. Patient used to be on losartan 25 mg, recently stopped taking because he is determined to control his blood pressures with diet and exercise.  He has variable blood pressures at his primary care physician's office. ED Course: On arrival to emergency room last night, blood pressures 201/105, had vague chest pain that improved with aspirin and nitroglycerin.  2 sets of troponins are negative since then.  Arrival EKG was without ST changes.   Patient was planned to start on nitroglycerin drip, however his blood pressures fairly improved with sublingual nitroglycerin and aspirin, his chest pain improved. Patient was examined immediately on arrival to hospital, his blood pressures are 152/82.  Patient is without any chest pain.  A bedside EKG was done and reviewed by me that shows no ST-T wave changes.  Comparable to last night's EKG.  Hospital Course:  1 chest pain ?? etiology.  Likely secondary to accelerated hypertension.  Patient was admitted placed on an increased dose of his home regimen of losartan.  Patient was placed on telemetry. EKG done with no ischemic changes noted.  Cardiac enzymes negative x3.    Fasting lipid panel with LDL of 108.  Blood pressure improved on regimen of Cozaar.    Patient also placed on aspirin.  Patient improved clinically during the hospitalization and had no further chest pain.  2D echo was done with a EF of 55 to 60%, mildly increased LVH, left ventricular diastolic Doppler parameters consistent with impaired relaxation.  No evidence of left ventricular wall motion abnormalities.  Patient had no further chest pain for the rest of the hospitalization and be discharged home in stable and improved condition.  Patient will follow-up with cardiology in the outpatient setting for outpatient stress test and further evaluation.  Patient will discharged home in stable and improved condition.   2.  Accelerated hypertension On admission patient noted to have a significantly elevated blood pressure of 201/105 on presentation to the ED.  EKG with no ST changes.  Patient noted to be trying to manage his blood pressure with diet and exercise and has been off  his antihypertensive medications for over a month.  Patient placed back on losartan at an increased dose of 50 mg daily.  Blood pressure improved.  Outpatient follow-up with PCP.  Procedures:  2D echo 11/09/2018  Chest x-ray  11/08/2018  Consultations:  None  Discharge Exam: Vitals:   11/09/18 0829 11/09/18 1426  BP: (!) 142/83 (!) 153/93  Pulse: 72   Resp: 15   Temp: 97.6 F (36.4 C) 98.1 F (36.7 C)  SpO2:      General: NAD Cardiovascular: RRR Respiratory: CTAB  Discharge Instructions   Discharge Instructions    Diet - low sodium heart healthy   Complete by:  As directed    Increase activity slowly   Complete by:  As directed      Allergies as of 11/09/2018   No Known Allergies     Medication List    TAKE these medications   aspirin 81 MG chewable tablet Chew 1 tablet (81 mg total) by mouth daily. Start taking on:  November 10, 2018   losartan 50 MG tablet Commonly known as:  COZAAR Take 1 tablet (50 mg total) by mouth daily. Start taking on:  November 10, 2018      No Known Allergies Follow-up Information    [name], MD Follow up on 11/18/2018.   Specialty:  Cardiology Why:  at 9:40am for your follow up appt.  Contact information: [address] [place] [address] [phone]        [name], MD. Schedule an appointment as soon as possible for a visit in 2 week(s).   Specialty:  Family Medicine Contact information: [address] [place] [address] [phone]            The results of significant diagnostics from this hospitalization (including imaging, microbiology, ancillary and laboratory) are listed below for reference.    Significant Diagnostic Studies: Dg Chest 2 View  Result Date: 11/08/2018 CLINICAL DATA:  Chest pain. EXAM: CHEST - 2 VIEW COMPARISON:  07/04/2006 FINDINGS: The cardiomediastinal contours are normal. The lungs are clear. Pulmonary vasculature is normal. No consolidation, pleural effusion, or pneumothorax. No acute osseous abnormalities are seen. Calcified granuloma at the right lung base versus hepatic granuloma. IMPRESSION: No acute chest findings. Electronically Signed   By: Keith Rake  M.D.   On: 11/08/2018 01:13    Microbiology: Recent Results (from the past 240 hour(s))  MRSA PCR Screening     Status: None   Collection Time: 11/08/18  9:10 AM  Result Value Ref Range Status   MRSA by PCR NEGATIVE NEGATIVE Final    Comment:        The GeneXpert MRSA Assay (FDA approved for NASAL specimens only), is one component of a comprehensive MRSA colonization surveillance program. It is not intended to diagnose MRSA infection nor to guide or monitor treatment for MRSA infections. Performed at [facility], [address] [place], [place] [address]      Labs: Basic Metabolic Panel: Recent Labs  Lab 11/08/18 0101 11/09/18 0748  NA 136 138  K 3.5 3.9  CL 107 106  CO2 22 22  GLUCOSE 116* 121*  BUN 14 14  CREATININE 0.94 0.99  CALCIUM 8.7* 9.1   Liver Function Tests: No results for input(s): AST, ALT, ALKPHOS, BILITOT, PROT, ALBUMIN in the last 168 hours. No results for input(s): LIPASE, AMYLASE in the last 168 hours. No results for input(s): AMMONIA in the last 168 hours. CBC:  Recent Labs  Lab 11/08/18 0101 11/08/18 1258 11/09/18 0748  WBC 7.8 5.9 6.7  NEUTROABS  --   --  3.6  HGB 13.5 13.4 14.7  HCT 40.6 39.9 43.5  MCV 88.3 87.3 87.2  PLT 242 251 268   Cardiac Enzymes: Recent Labs  Lab 11/08/18 0101 11/08/18 0449 11/08/18 1258 11/08/18 1837 11/09/18 0031  TROPONINI <0.03 <0.03 <0.03 <0.03 <0.03   BNP: BNP (last 3 results) No results for input(s): BNP in the last 8760 hours.  ProBNP (last 3 results) No results for input(s): PROBNP in the last 8760 hours.  CBG: No results for input(s): GLUCAP in the last 168 hours.     Signed:  Irine Seal MD.  [facility] 11/09/2018, 5:06 PM

## 2018-11-12 ENCOUNTER — Telehealth: Payer: Self-pay

## 2018-11-12 NOTE — Telephone Encounter (Addendum)
Per Dr.Acharya.new pt appt scheduled incorrectly in last slot of a quarter day. Pt scheduled on 3/18 @ 9:40am for a new pt 74min appt. Called the pt. lmtcb to reschedule. Possible appt date and time to offer the pt 3/18 @ 8am, 3/23 @ 9:20am or 2pm.

## 2018-11-13 NOTE — Telephone Encounter (Signed)
Pt wife sts that they have rescheduled the pt appt with DR.Revankar @ the [facility] on for 11/24/18. The pt lives in [place] and that location is more convenient. Pt wife voiced appreciation for the call.

## 2018-11-18 ENCOUNTER — Ambulatory Visit: Payer: BLUE CROSS/BLUE SHIELD | Admitting: Internal Medicine

## 2018-11-23 ENCOUNTER — Telehealth: Payer: Self-pay

## 2018-11-23 NOTE — Telephone Encounter (Signed)
Called patient to discuss upcoming appt. Wife Marliss Coots feels that he is currently asymptomatic and does not have urgency to see cardiology. Patient is scheduled to see NP on 11/25/2018 and will follow up if cardiology is still needed. No further questions at this time.

## 2018-11-24 ENCOUNTER — Ambulatory Visit: Payer: BLUE CROSS/BLUE SHIELD | Admitting: Cardiology

## 2018-11-25 ENCOUNTER — Ambulatory Visit: Payer: BLUE CROSS/BLUE SHIELD | Admitting: Medical

## 2018-11-26 ENCOUNTER — Telehealth: Payer: Self-pay

## 2018-11-26 NOTE — Telephone Encounter (Signed)
Patient was bumped for stress testing rescheduled for follow up appt

## 2019-01-26 ENCOUNTER — Ambulatory Visit (INDEPENDENT_AMBULATORY_CARE_PROVIDER_SITE_OTHER): Payer: BLUE CROSS/BLUE SHIELD | Admitting: Medical

## 2019-01-26 ENCOUNTER — Encounter: Payer: Self-pay | Admitting: Medical

## 2019-01-26 VITALS — BP 136/75 | HR 69 | Temp 98.7°F | Ht 71.0 in | Wt 215.0 lb

## 2019-01-26 DIAGNOSIS — I10 Essential (primary) hypertension: Secondary | ICD-10-CM | POA: Diagnosis not present

## 2019-01-26 DIAGNOSIS — E785 Hyperlipidemia, unspecified: Secondary | ICD-10-CM | POA: Diagnosis not present

## 2019-01-26 DIAGNOSIS — Z125 Encounter for screening for malignant neoplasm of prostate: Secondary | ICD-10-CM

## 2019-01-26 MED ORDER — LOSARTAN POTASSIUM 50 MG PO TABS: 50.0000 mg | ORAL_TABLET | Freq: Every day | ORAL | 1 refills | Status: DC

## 2019-01-26 NOTE — Patient Instructions (Addendum)
Bp well controlled. Continue losartan. Filled rx today.  For high cholesterol, put in order for cmp and lipid panel today. Get scheduled for that.  Also ordered screening psa.  Follow up with cardiologist early June. But with me in office last week of august or first week of September.

## 2019-01-26 NOTE — Progress Notes (Signed)
Subjective:    Patient ID: Andres [name], male    DOB: July 20, 1956, 63 y.o.   MRN: [id number]  HPI  .Virtual Visit via Video Note  I connected with [name] on 01/26/19 at  1:00 PM EDT by a video enabled telemedicine application and verified that I am speaking with the correct person using two identifiers.  Location: Patient: home Provider: home.   I discussed the limitations of evaluation and management by telemedicine and the availability of in person appointments. The patient expressed understanding and agreed to proceed.    History of Present Illness:   Pt first time with our office. Pt works Air traffic controller. Pt states he eats healthy presently. On dash diet. Non smoker. Married-   Pt has htn. Was on losartan since March, 2020. BP controlled since on med.  Also started aspirin in march.  Pt states no chest pain since march, 2020. Pain started while he was sleeping. He was evaluated at ED. 2 negative troponin. He got nitroglycerin and morphine and chest pain went away. Pt had been exercising every day for about 1 hour and half before pandemic. He was going to [employer].(No symptoms of heart burn/gerd). Pt states pain was constant for about 2 hours. Just had mid chest pressure when he had those symptoms. Felt like someone sitting on chest.  Pt has appointment with Dr. Cherrie Gauze in June   The 10-year ASCVD risk score [name] DC [name]., et al., 2013) is: 11.7%   Values used to calculate the score:     Age: 72 years     Sex: Male     Is Non-Hispanic African American: No     Diabetic: No     Tobacco smoker: No     Systolic Blood Pressure: 673 mmHg     Is BP treated: Yes     HDL Cholesterol: 40 mg/dL     Total Cholesterol: 178 mg/dL    Stopped smoking 35 years ago. Smoke 2-3 years pack would last 2-3 weeks.  Pt no family history of MI or strokes except for mother with very small stroke in mother in her late 37's.      Observations/Objective: Video  worked fine for 5 minutes but then audio went out.(so exam based on when video and audio worked) then had to finish by phone due to audio failure.  General-no acute distress, pleasant, oriented. Lungs- on inspection lungs appear unlabored. Neck- no tracheal deviation or jvd on inspection. Neuro- gross motor function appears intact.   Assessment and Plan: Bp well controlled. Continue losartan. Filled rx today.  For high cholesterol, put in order for cmp and lipid panel today. Get scheduled for that.  Also ordered screening psa.  Follow up with cardiologist early June. But with me in office last week of august or first week of September.  20 minutes spent with new pt. 50% of time spent explaining on plan going forward.  Follow Up Instructions:    I discussed the assessment and treatment plan with the patient. The patient was provided an opportunity to ask questions and all were answered. The patient agreed with the plan and demonstrated an understanding of the instructions.   The patient was advised to call back or seek an in-person evaluation if the symptoms worsen or if the condition fails to improve as anticipated.     Mackie Pai, PA-C   Review of Systems  Constitutional: Negative for chills, fatigue and fever.  Respiratory: Negative for cough, chest tightness, shortness of  breath and wheezing.   Cardiovascular: Negative for chest pain and palpitations.  Gastrointestinal: Negative for abdominal pain.  Musculoskeletal: Negative for back pain and gait problem.  Neurological: Negative for dizziness, speech difficulty, weakness, numbness and headaches.  Hematological: Negative for adenopathy. Does not bruise/bleed easily.  Psychiatric/Behavioral: Negative for behavioral problems and confusion.   Past Medical History:  Diagnosis Date  . Basal cell carcinoma   . Hypertension      Social History   Socioeconomic History  . Marital status: Married    Spouse name: Not  on file  . Number of children: Not on file  . Years of education: Not on file  . Highest education level: Not on file  Occupational History  . Not on file  Social Needs  . Financial resource strain: Not on file  . Food insecurity:    Worry: Not on file    Inability: Not on file  . Transportation needs:    Medical: Not on file    Non-medical: Not on file  Tobacco Use  . Smoking status: Former Research scientist (life sciences)  . Smokeless tobacco: Never Used  Substance and Sexual Activity  . Alcohol use: Not Currently    Frequency: Never  . Drug use: Never  . Sexual activity: Yes  Lifestyle  . Physical activity:    Days per week: Not on file    Minutes per session: Not on file  . Stress: Not on file  Relationships  . Social connections:    Talks on phone: Not on file    Gets together: Not on file    Attends religious service: Not on file    Active member of club or organization: Not on file    Attends meetings of clubs or organizations: Not on file    Relationship status: Not on file  . Intimate partner violence:    Fear of current or ex partner: Not on file    Emotionally abused: Not on file    Physically abused: Not on file    Forced sexual activity: Not on file  Other Topics Concern  . Not on file  Social History Narrative  . Not on file    No past surgical history on file.  No family history on file.  Allergies  Allergen Reactions  . Niacin And Related Shortness Of Breath    Current Outpatient Medications on File Prior to Visit  Medication Sig Dispense Refill  . aspirin 81 MG chewable tablet Chew 1 tablet (81 mg total) by mouth daily.     No current facility-administered medications on file prior to visit.     BP 136/75   Pulse 69   Temp 98.7 F (37.1 C)   Ht 5\' 11"  (1.803 m)   Wt 215 lb (97.5 kg)   BMI 29.99 kg/m       Objective:   Physical Exam        Assessment & Plan:

## 2019-03-09 ENCOUNTER — Ambulatory Visit: Payer: BLUE CROSS/BLUE SHIELD | Admitting: Cardiology

## 2019-03-17 ENCOUNTER — Other Ambulatory Visit: Payer: Self-pay

## 2019-03-17 ENCOUNTER — Encounter: Payer: Self-pay | Admitting: Cardiology

## 2019-03-17 ENCOUNTER — Ambulatory Visit: Payer: BLUE CROSS/BLUE SHIELD | Admitting: Cardiology

## 2019-03-17 VITALS — BP 140/78 | HR 77 | Ht 71.0 in | Wt 247.0 lb

## 2019-03-17 DIAGNOSIS — I10 Essential (primary) hypertension: Secondary | ICD-10-CM | POA: Diagnosis not present

## 2019-03-17 DIAGNOSIS — E669 Obesity, unspecified: Secondary | ICD-10-CM

## 2019-03-17 DIAGNOSIS — R079 Chest pain, unspecified: Secondary | ICD-10-CM

## 2019-03-17 NOTE — Patient Instructions (Addendum)
Medication Instructions:  Your physician recommends that you continue on your current medications as directed. Please refer to the Current Medication list given to you today.  If you need a refill on your cardiac medications before your next appointment, please call your pharmacy.   Lab work: NONE If you have labs (blood work) drawn today and your tests are completely normal, you will receive your results only by: Marland Kitchen MyChart Message (if you have MyChart) OR . A paper copy in the mail If you have any lab test that is abnormal or we need to change your treatment, we will call you to review the results.  Testing/Procedures: YOU HAVE been scheduled to have a Coronary calcium score performed on                                 At [facility]. THIS PROCEDURE has a $150 copay at time of arrival.   Follow-Up: At [facility], you and your health needs are our priority.  As part of our continuing mission to provide you with exceptional heart care, we have created designated Provider Care Teams.  These Care Teams include your primary Cardiologist (physician) and Advanced Practice Providers (APPs -  Physician Assistants and Nurse Practitioners) who all work together to provide you with the care you need, when you need it. You will need a follow up appointment in 4 months.     Any Other Special Instructions Will Be Listed Below    Coronary Calcium Scan A coronary calcium scan is an imaging test used to look for deposits of calcium and other fatty materials (plaques) in the inner lining of the blood vessels of the heart (coronary arteries). These deposits of calcium and plaques can partly clog and narrow the coronary arteries without producing any symptoms or warning signs. This puts a person at risk for a heart attack. This test can detect these deposits before symptoms develop. Tell a health care provider about:  Any allergies you have.  All medicines you are taking, including vitamins, herbs,  eye drops, creams, and over-the-counter medicines.  Any problems you or family members have had with anesthetic medicines.  Any blood disorders you have.  Any surgeries you have had.  Any medical conditions you have.  Whether you are pregnant or may be pregnant. What are the risks? Generally, this is a safe procedure. However, problems may occur, including:  Harm to a pregnant woman and her unborn baby. This test involves the use of radiation. Radiation exposure can be dangerous to a pregnant woman and her unborn baby. If you are pregnant, you generally should not have this procedure done.  Slight increase in the risk of cancer. This is because of the radiation involved in the test. What happens before the procedure? No preparation is needed for this procedure. What happens during the procedure?   You will undress and remove any jewelry around your neck or chest.  You will put on a hospital gown.  Sticky electrodes will be placed on your chest. The electrodes will be connected to an electrocardiogram (ECG) machine to record a tracing of the electrical activity of your heart.  A CT scanner will take pictures of your heart. During this time, you will be asked to lie still and hold your breath for 2-3 seconds while a picture of your heart is being taken. The procedure may vary among health care providers and hospitals. What happens after  the procedure?  You can get dressed.  You can return to your normal activities.  It is up to you to get the results of your test. Ask your health care provider, or the department that is doing the test, when your results will be ready. Summary  A coronary calcium scan is an imaging test used to look for deposits of calcium and other fatty materials (plaques) in the inner lining of the blood vessels of the heart (coronary arteries).  Generally, this is a safe procedure. Tell your health care provider if you are pregnant or may be pregnant.  No  preparation is needed for this procedure.  A CT scanner will take pictures of your heart.  You can return to your normal activities after the scan is done. This information is not intended to replace advice given to you by your health care provider. Make sure you discuss any questions you have with your health care provider. Document Released: 02/15/2008 Document Revised: 08/01/2017 Document Reviewed: 07/08/2016 Elsevier Patient Education  2020 [employer].

## 2019-03-17 NOTE — Progress Notes (Signed)
Cardiology Office Note:    Date:  03/17/2019   ID:  Andres [name], DOB [birth year], MRN [id number]  PCP:  Mackie Pai, PA-C  Cardiologist:  Jenean Lindau, MD   Referring MD: [name], MD    ASSESSMENT:    1. Chest pain, unspecified type   2. Essential hypertension   3. Obesity (BMI 30-39.9)    PLAN:    In order of problems listed above:  1. Chest discomfort: This is very atypical for coronary etiology.  I have made a detailed description of his symptoms.  I reassured him about these findings.  That does not mean he may not have nonobstructive coronary artery disease and risk stratification would be a good idea.  I suggested calcium scoring and he is agreeable with him and his wife on the phone extensively. 2. Obesity: Risks of obesity explained.  He understood.  He has gained about 25 pounds in the past 3 months due to the viral pandemic situation and being sedentary.  I cautioned him against this.  I also discussed with him about his lipids and his hemoglobin A1c and diet was encouraged strongly. 3. Essential hypertension: Blood pressure stable.  He will keep a track of it. 4. Patient will be seen in follow-up appointment in 6 months or earlier if the patient has any concerns    Medication Adjustments/Labs and Tests Ordered: Current medicines are reviewed at length with the patient today.  Concerns regarding medicines are outlined above.  Orders Placed This Encounter  Procedures   CT CARDIAC SCORING   No orders of the defined types were placed in this encounter.    History of Present Illness:    Andres [name] is a 63 y.o. male who is being seen today for the evaluation of chest discomfort at the request of [name], MD.  Pleasant 63 year old male.  In the month of March he had an episode of chest discomfort and went to the emergency room.  I reviewed all those records.  The patient mentions to me that he was given nitroglycerin and this did not relieve  his symptoms.  Subsequently he was treated and released.  He was told to get stress testing but he did not do this because of the COVID-19 situation.  Since then he started walking on a regular basis.  Even the last week and he walked 45 minutes with his dog at the park without any problem.  No chest pain orthopnea or PND.  For precaution sake he wanted to come and see me.  At the time of my evaluation, the patient is alert awake oriented and in no distress.  Past Medical History:  Diagnosis Date   Basal cell carcinoma    Hypertension     History reviewed. No pertinent surgical history.  Current Medications: Current Meds  Medication Sig   Ascorbic Acid (VITAMIN C) 500 MG CHEW Chew 1 tablet by mouth daily.   aspirin 81 MG chewable tablet Chew 1 tablet (81 mg total) by mouth daily.   B Complex Vitamins (B-COMPLEX/B-12) TABS Take 1 tablet by mouth daily.   losartan (COZAAR) 50 MG tablet Take 1 tablet (50 mg total) by mouth daily.   Omega-3 Fatty Acids (FISH OIL) 1000 MG CAPS Take 1 capsule by mouth daily.     Allergies:   Niacin and related   Social History   Socioeconomic History   Marital status: Married    Spouse name: Not on file   Number of children: Not on  file   Years of education: Not on file   Highest education level: Not on file  Occupational History   Not on file  Social Needs   Financial resource strain: Not on file   Food insecurity    Worry: Not on file    Inability: Not on file   Transportation needs    Medical: Not on file    Non-medical: Not on file  Tobacco Use   Smoking status: Former Smoker   Smokeless tobacco: Never Used  Substance and Sexual Activity   Alcohol use: Not Currently    Frequency: Never   Drug use: Never   Sexual activity: Yes  Lifestyle   Physical activity    Days per week: Not on file    Minutes per session: Not on file   Stress: Not on file  Relationships   Social connections    Talks on phone: Not on file     Gets together: Not on file    Attends religious service: Not on file    Active member of club or organization: Not on file    Attends meetings of clubs or organizations: Not on file    Relationship status: Not on file  Other Topics Concern   Not on file  Social History Narrative   Not on file     Family History: The patient's family history is not on file.  ROS:   Please see the history of present illness.    All other systems reviewed and are negative.  EKGs/Labs/Other Studies Reviewed:    The following studies were reviewed today: EKG reveals sinus rhythm and nonspecific ST-T changes and was done in the ED in the ER   Recent Labs: 11/09/2018: BUN 14; Creatinine, Ser 0.99; Hemoglobin 14.7; Platelets 268; Potassium 3.9; Sodium 138  Recent Lipid Panel    Component Value Date/Time   CHOL 178 11/08/2018 1258   TRIG 152 (H) 11/08/2018 1258   HDL 40 (L) 11/08/2018 1258   CHOLHDL 4.5 11/08/2018 1258   VLDL 30 11/08/2018 1258   LDLCALC 108 (H) 11/08/2018 1258    Physical Exam:    VS:  BP 140/78 (BP Location: Left Arm, Patient Position: Sitting, Cuff Size: Normal)    Pulse 77    Ht 5\' 11"  (1.803 m)    Wt 247 lb (112 kg)    SpO2 99%    BMI 34.45 kg/m     Wt Readings from Last 3 Encounters:  03/17/19 247 lb (112 kg)  01/26/19 215 lb (97.5 kg)  11/08/18 245 lb 2.4 oz (111.2 kg)     GEN: Patient is in no acute distress HEENT: Normal NECK: No JVD; No carotid bruits LYMPHATICS: No lymphadenopathy CARDIAC: S1 S2 regular, 2/6 systolic murmur at the apex. RESPIRATORY:  Clear to auscultation without rales, wheezing or rhonchi  ABDOMEN: Soft, non-tender, non-distended MUSCULOSKELETAL:  No edema; No deformity  SKIN: Warm and dry NEUROLOGIC:  Alert and oriented x 3 PSYCHIATRIC:  Normal affect    Signed, Jenean Lindau, MD  03/17/2019 8:41 AM    [place]

## 2019-04-05 ENCOUNTER — Other Ambulatory Visit: Payer: Self-pay

## 2019-04-05 ENCOUNTER — Other Ambulatory Visit (INDEPENDENT_AMBULATORY_CARE_PROVIDER_SITE_OTHER): Payer: BLUE CROSS/BLUE SHIELD

## 2019-04-05 DIAGNOSIS — Z125 Encounter for screening for malignant neoplasm of prostate: Secondary | ICD-10-CM | POA: Diagnosis not present

## 2019-04-05 DIAGNOSIS — I10 Essential (primary) hypertension: Secondary | ICD-10-CM | POA: Diagnosis not present

## 2019-04-05 DIAGNOSIS — E785 Hyperlipidemia, unspecified: Secondary | ICD-10-CM | POA: Diagnosis not present

## 2019-04-05 LAB — LIPID PANEL
Cholesterol: 161 mg/dL (ref 0–200)
HDL: 38.7 mg/dL — ABNORMAL LOW (ref >39.00)
LDL Cholesterol: 94 mg/dL (ref 0–99)
NonHDL: 122.08
Total CHOL/HDL Ratio: 4
Triglycerides: 139 mg/dL (ref 0.0–149.0)
VLDL: 27.8 mg/dL (ref 0.0–40.0)

## 2019-04-05 LAB — COMPREHENSIVE METABOLIC PANEL
ALT: 12 U/L (ref 0–53)
AST: 12 U/L (ref 0–37)
Albumin: 3.8 g/dL (ref 3.5–5.2)
Alkaline Phosphatase: 53 U/L (ref 39–117)
BUN: 17 mg/dL (ref 6–23)
CO2: 25 mEq/L (ref 19–32)
Calcium: 9.2 mg/dL (ref 8.4–10.5)
Chloride: 102 mEq/L (ref 96–112)
Creatinine, Ser: 0.91 mg/dL (ref 0.40–1.50)
GFR: 84.73 mL/min (ref >60.00)
Glucose, Bld: 89 mg/dL (ref 70–99)
Potassium: 4.2 mEq/L (ref 3.5–5.1)
Sodium: 136 mEq/L (ref 135–145)
Total Bilirubin: 0.6 mg/dL (ref 0.2–1.2)
Total Protein: 6.7 g/dL (ref 6.0–8.3)

## 2019-04-05 LAB — PSA: PSA: 3.6 ng/mL (ref 0.10–4.00)

## 2019-04-06 ENCOUNTER — Telehealth: Payer: Self-pay | Admitting: Medical

## 2019-04-06 ENCOUNTER — Encounter: Payer: Self-pay | Admitting: Medical

## 2019-04-08 ENCOUNTER — Telehealth: Payer: Self-pay | Admitting: Medical

## 2019-04-08 DIAGNOSIS — R972 Elevated prostate specific antigen [PSA]: Secondary | ICD-10-CM

## 2019-04-08 NOTE — Telephone Encounter (Signed)
Referral to urologist placed. 

## 2019-04-19 ENCOUNTER — Other Ambulatory Visit: Payer: Self-pay

## 2019-04-19 ENCOUNTER — Ambulatory Visit (INDEPENDENT_AMBULATORY_CARE_PROVIDER_SITE_OTHER)
Admission: RE | Admit: 2019-04-19 | Discharge: 2019-04-19 | Disposition: A | Discharge status: Home / Self Care | Payer: Self-pay | Source: Ambulatory Visit | Attending: Cardiology | Admitting: Cardiology

## 2019-04-19 DIAGNOSIS — R079 Chest pain, unspecified: Secondary | ICD-10-CM

## 2019-05-04 ENCOUNTER — Other Ambulatory Visit: Payer: Self-pay

## 2019-05-04 ENCOUNTER — Encounter: Payer: Self-pay | Admitting: Medical

## 2019-05-04 ENCOUNTER — Ambulatory Visit: Payer: BLUE CROSS/BLUE SHIELD | Admitting: Medical

## 2019-05-04 VITALS — BP 137/79 | HR 73 | Temp 97.4°F | Resp 16 | Ht 71.0 in | Wt 255.8 lb

## 2019-05-04 DIAGNOSIS — Z1283 Encounter for screening for malignant neoplasm of skin: Secondary | ICD-10-CM

## 2019-05-04 DIAGNOSIS — I10 Essential (primary) hypertension: Secondary | ICD-10-CM | POA: Diagnosis not present

## 2019-05-04 DIAGNOSIS — L989 Disorder of the skin and subcutaneous tissue, unspecified: Secondary | ICD-10-CM

## 2019-05-04 NOTE — Patient Instructions (Signed)
Your blood pressure is well controlled today.  Continue losartan, daily exercise and recommend healthy diet.  If you do lose some weight as you mention that I do think your blood pressure will improve.  For history of increased prostate velocity, I did go ahead and place the urology referral.([phone])  Would recommend that you call them and pointed out that the referral has already been placed.  This might expedite your appointment.  If you call I do not get appointment date please let me know.  For history of skin CA and recent new lesions, I went ahead and referred you to a dermatologist.  If they do not call you within couple weeks please let me know as I could refer you to a different dermatologist.  Sometimes have success referring out of town even as far as Technical sales engineer.  Follow up 5 month or as needed

## 2019-05-04 NOTE — Progress Notes (Signed)
Subjective:    Patient ID: Andres [name], male    DOB: 06/04/1956, 63 y.o.   MRN: [id number]  HPI  Pt states he feels well today.  Pt updates me some knee pain. He has put on weight since onset of covid. Recently he started to exercise. Walking 30 minutes- a day.  He does lengthy walks on sundays about 2 hours.   Pt has mild increased prostate velocity. No symptoms. I put in referral early august and no one called him yet.  Pt also has skin lesions on his back and wants dermatologist referral. He states hx of skin cancer.   Pt has hx of htn. BP controlled today.  Pt will get flu vaccine this year.    Review of Systems  Constitutional: Negative for chills, fatigue and fever.  HENT: Negative for congestion, drooling, ear pain, nosebleeds, sinus pressure and sinus pain.   Respiratory: Negative for cough, chest tightness, wheezing and stridor.   Cardiovascular: Negative for chest pain and palpitations.  Gastrointestinal: Negative for abdominal pain, constipation, diarrhea and vomiting.  Genitourinary: Negative for dysuria, flank pain, frequency and hematuria.  Musculoskeletal: Negative for back pain.  Skin:       Skin lesion.  Neurological: Negative for dizziness and headaches.  Hematological: Negative for adenopathy. Does not bruise/bleed easily.  Psychiatric/Behavioral: Negative for behavioral problems, confusion, hallucinations and sleep disturbance. The patient is not nervous/anxious.    Past Medical History:  Diagnosis Date  . Basal cell carcinoma   . Hypertension      Social History   Socioeconomic History  . Marital status: Married    Spouse name: Not on file  . Number of children: Not on file  . Years of education: Not on file  . Highest education level: Not on file  Occupational History  . Not on file  Social Needs  . Financial resource strain: Not on file  . Food insecurity    Worry: Not on file    Inability: Not on file  . Transportation needs     Medical: Not on file    Non-medical: Not on file  Tobacco Use  . Smoking status: Former Research scientist (life sciences)  . Smokeless tobacco: Never Used  Substance and Sexual Activity  . Alcohol use: Not Currently    Frequency: Never  . Drug use: Never  . Sexual activity: Yes  Lifestyle  . Physical activity    Days per week: Not on file    Minutes per session: Not on file  . Stress: Not on file  Relationships  . Social Herbalist on phone: Not on file    Gets together: Not on file    Attends religious service: Not on file    Active member of club or organization: Not on file    Attends meetings of clubs or organizations: Not on file    Relationship status: Not on file  . Intimate partner violence    Fear of current or ex partner: Not on file    Emotionally abused: Not on file    Physically abused: Not on file    Forced sexual activity: Not on file  Other Topics Concern  . Not on file  Social History Narrative  . Not on file    No past surgical history on file.  No family history on file.  Allergies  Allergen Reactions  . Niacin And Related Shortness Of Breath    Current Outpatient Medications on File Prior to Visit  Medication Sig  Dispense Refill  . Ascorbic Acid (VITAMIN C) 500 MG CHEW Chew 1 tablet by mouth daily.    Marland Kitchen aspirin 81 MG chewable tablet Chew 1 tablet (81 mg total) by mouth daily.    . B Complex Vitamins (B-COMPLEX/B-12) TABS Take 1 tablet by mouth daily.    Marland Kitchen losartan (COZAAR) 50 MG tablet Take 1 tablet (50 mg total) by mouth daily. 90 tablet 1  . Omega-3 Fatty Acids (FISH OIL) 1000 MG CAPS Take 1 capsule by mouth daily.     No current facility-administered medications on file prior to visit.     BP 137/79   Pulse 73   Temp (!) 97.4 F (36.3 C) (Temporal)   Resp 16   Ht 5\' 11"  (1.803 m)   Wt 255 lb 12.8 oz (116 kg)   SpO2 98%   BMI 35.68 kg/m       Objective:   Physical Exam  General Mental Status- Alert. General Appearance- Not in acute distress.    Skin General: Color- Normal Color. Moisture- Normal Moisture.  Neck Carotid Arteries- Normal color. Moisture- Normal Moisture. No carotid bruits. No JVD.  Chest and Lung Exam Auscultation: Breath Sounds:-Normal.  Cardiovascular Auscultation:Rythm- Regular. Murmurs & Other Heart Sounds:Auscultation of the heart reveals- No Murmurs.  Abdomen Inspection:-Inspeection Normal. Palpation/Percussion:Note:No mass. Palpation and Percussion of the abdomen reveal- Non Tender, Non Distended + BS, no rebound or guarding.   Neurologic Cranial Nerve exam:- CN III-XII intact(No nystagmus), symmetric smile. Strength:- 5/5 equal and symmetric strength both upper and lower extremities.  Skin- various scattered moderate to large moles. Left upper back- large s. Keratosis type lesion.      Assessment & Plan:  Your blood pressure is well controlled today.  Continue losartan, daily exercise and recommend healthy diet.  If you do lose some weight as you mention that I do think your blood pressure will improve.  For history of increased prostate velocity, I did go ahead and place the urology referral.([phone])  Would recommend that you call them and pointed out that the referral has already been placed.  This might expedite your appointment.  If you call I do not get appointment date please let me know.  For history of skin CA and recent new lesions, I went ahead and referred you to a dermatologist.  If they do not call you within couple weeks please let me know as I could refer you to a different dermatologist.  Sometimes have success referring out of town even as far as Technical sales engineer.  Follow up 5 month or as needed  25 minutes spent with pt. 50% of times spent counseling pt on plan going forwared.  Mackie Pai, PA-C

## 2019-05-06 ENCOUNTER — Telehealth: Payer: Self-pay

## 2019-05-06 NOTE — Telephone Encounter (Signed)
Information relayed, copy sent to Dr. Lowell Guitar per Dr. Docia Furl request.

## 2019-05-06 NOTE — Telephone Encounter (Signed)
-----   Message from Jenean Lindau, MD sent at 05/02/2019  7:30 PM EDT ----- Low calcium score which is good news.  Primary prevention stressed diet and exercise to be emphasized.  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  Please let the patient know to call his primary care doctor to get this evaluated.  Please call the physician's office and speak to their nurse and send a copy of this report and document this conversation. Jenean Lindau, MD 05/02/2019 7:29 PM

## 2019-05-28 DIAGNOSIS — N5201 Erectile dysfunction due to arterial insufficiency: Secondary | ICD-10-CM | POA: Diagnosis not present

## 2019-05-28 DIAGNOSIS — R972 Elevated prostate specific antigen [PSA]: Secondary | ICD-10-CM | POA: Diagnosis not present

## 2019-07-19 ENCOUNTER — Ambulatory Visit (INDEPENDENT_AMBULATORY_CARE_PROVIDER_SITE_OTHER): Payer: BLUE CROSS/BLUE SHIELD | Admitting: Cardiology

## 2019-07-19 ENCOUNTER — Encounter: Payer: Self-pay | Admitting: Cardiology

## 2019-07-19 ENCOUNTER — Other Ambulatory Visit: Payer: Self-pay

## 2019-07-19 VITALS — BP 148/76 | HR 63 | Ht 71.0 in | Wt 255.1 lb

## 2019-07-19 DIAGNOSIS — I10 Essential (primary) hypertension: Secondary | ICD-10-CM

## 2019-07-19 MED ORDER — LOSARTAN POTASSIUM 100 MG PO TABS: 100.0000 mg | ORAL_TABLET | Freq: Every day | ORAL | 4 refills | Status: DC

## 2019-07-19 NOTE — Patient Instructions (Signed)
Medication Instructions:  Your physician has recommended you make the following change in your medication:  INCREASE losartan to 100 mg (1 tablet) once daily  *If you need a refill on your cardiac medications before your next appointment, please call your pharmacy*  Lab Work: Your physician recommends that you return FASTING in 2 weeks for a BMP  If you have labs (blood work) drawn today and your tests are completely normal, you will receive your results only by: Marland Kitchen MyChart Message (if you have MyChart) OR . A paper copy in the mail If you have any lab test that is abnormal or we need to change your treatment, we will call you to review the results.  Testing/Procedures: NONE  Follow-Up: At [facility], you and your health needs are our priority.  As part of our continuing mission to provide you with exceptional heart care, we have created designated Provider Care Teams.  These Care Teams include your primary Cardiologist (physician) and Advanced Practice Providers (APPs -  Physician Assistants and Nurse Practitioners) who all work together to provide you with the care you need, when you need it.  Your next appointment:   6 months  The format for your next appointment:   In Person  Provider:   Jyl Heinz, MD

## 2019-07-19 NOTE — Progress Notes (Signed)
Cardiology Office Note:    Date:  07/19/2019   ID:  Andres [name], DOB 1956/01/07, MRN [id number]  PCP:  Mackie Pai, PA-C  Cardiologist:  Jenean Lindau, MD   Referring MD: Mackie Pai, PA-C    ASSESSMENT:    1. Essential hypertension    PLAN:    In order of problems listed above:  1. Essential hypertension: Primary prevention stressed with the patient.  Importance of compliance with diet and medication stressed and he vocalized understanding.  Importance of exercise and also protocol was stressed and he vocalized understanding.  Salt intake issues were discussed I increase losartan 200 mg daily and he will be back in 2 weeks for a Chem-7.  He will keep a track of his blood pressures and let us know. 2. Obesity: Weight reduction was stressed.  He vocalized understanding.  Diet emphasized. 3. Patient will be seen in follow-up appointment in 6 months or earlier if the patient has any concerns    Medication Adjustments/Labs and Tests Ordered: Current medicines are reviewed at length with the patient today.  Concerns regarding medicines are outlined above.  No orders of the defined types were placed in this encounter.  No orders of the defined types were placed in this encounter.    No chief complaint on file.    History of Present Illness:    Andres [name] is a 63 y.o. male.  Patient has past medical history of essential hypertension.  He is overweight and leads a sedentary lifestyle.  He mentions to me that he is doing 12-hour shifts at this time and therefore it is difficult for him to exercise.  He works 6 days a week.  He has gained weight.  At the time of my evaluation, the patient is alert awake oriented and in no distress.  His blood pressure is elevated and it is similar at home.  Past Medical History:  Diagnosis Date  . Basal cell carcinoma   . Hypertension     History reviewed. No pertinent surgical history.  Current Medications: Current Meds   Medication Sig  . Ascorbic Acid (VITAMIN C) 500 MG CHEW Chew 1 tablet by mouth daily.  Marland Kitchen aspirin 81 MG chewable tablet Chew 1 tablet (81 mg total) by mouth daily.  . B Complex Vitamins (B-COMPLEX/B-12) TABS Take 1 tablet by mouth daily.  Marland Kitchen losartan (COZAAR) 50 MG tablet Take 1 tablet (50 mg total) by mouth daily.  . Omega-3 Fatty Acids (FISH OIL) 1000 MG CAPS Take 1 capsule by mouth daily.     Allergies:   Niacin and related   Social History   Socioeconomic History  . Marital status: Married    Spouse name: Not on file  . Number of children: Not on file  . Years of education: Not on file  . Highest education level: Not on file  Occupational History  . Not on file  Social Needs  . Financial resource strain: Not on file  . Food insecurity    Worry: Not on file    Inability: Not on file  . Transportation needs    Medical: Not on file    Non-medical: Not on file  Tobacco Use  . Smoking status: Former Research scientist (life sciences)  . Smokeless tobacco: Never Used  Substance and Sexual Activity  . Alcohol use: Not Currently    Frequency: Never  . Drug use: Never  . Sexual activity: Yes  Lifestyle  . Physical activity    Days per week: Not on file  Minutes per session: Not on file  . Stress: Not on file  Relationships  . Social Herbalist on phone: Not on file    Gets together: Not on file    Attends religious service: Not on file    Active member of club or organization: Not on file    Attends meetings of clubs or organizations: Not on file    Relationship status: Not on file  Other Topics Concern  . Not on file  Social History Narrative  . Not on file     Family History: The patient's family history is not on file.  ROS:   Please see the history of present illness.    All other systems reviewed and are negative.  EKGs/Labs/Other Studies Reviewed:    The following studies were reviewed today: I discussed my findings with the patient at length.  CT scoring report was  discussed lipids were reviewed.  IMPRESSION: Coronary calcium score of 11. This was 40th percentile for age and sex matched control.  Fransico Him   Electronically Signed   By: Fransico Him   On: 04/19/2019   Recent Labs: 11/09/2018: Hemoglobin 14.7; Platelets 268 04/05/2019: ALT 12; BUN 17; Creatinine, Ser 0.91; Potassium 4.2; Sodium 136  Recent Lipid Panel    Component Value Date/Time   CHOL 161 04/05/2019 0717   TRIG 139.0 04/05/2019 0717   HDL 38.70 (L) 04/05/2019 0717   CHOLHDL 4 04/05/2019 0717   VLDL 27.8 04/05/2019 0717   LDLCALC 94 04/05/2019 0717    Physical Exam:    VS:  BP (!) 148/76 (BP Location: Left Arm, Patient Position: Sitting, Cuff Size: Normal)   Pulse 63   Ht 5\' 11"  (1.803 m)   Wt 255 lb 1.3 oz (115.7 kg)   SpO2 98%   BMI 35.58 kg/m     Wt Readings from Last 3 Encounters:  07/19/19 255 lb 1.3 oz (115.7 kg)  05/04/19 255 lb 12.8 oz (116 kg)  03/17/19 247 lb (112 kg)     GEN: Patient is in no acute distress HEENT: Normal NECK: No JVD; No carotid bruits LYMPHATICS: No lymphadenopathy CARDIAC: Hear sounds regular, 2/6 systolic murmur at the apex. RESPIRATORY:  Clear to auscultation without rales, wheezing or rhonchi  ABDOMEN: Soft, non-tender, non-distended MUSCULOSKELETAL:  No edema; No deformity  SKIN: Warm and dry NEUROLOGIC:  Alert and oriented x 3 PSYCHIATRIC:  Normal affect   Signed, Jenean Lindau, MD  07/19/2019 8:30 AM    [place]

## 2019-07-19 NOTE — Addendum Note (Signed)
Addended by: Beckey Rutter on: 07/19/2019 08:42 AM   Modules accepted: Orders

## 2019-07-24 ENCOUNTER — Other Ambulatory Visit: Payer: Self-pay | Admitting: Medical

## 2019-07-26 DIAGNOSIS — X32XXXS Exposure to sunlight, sequela: Secondary | ICD-10-CM | POA: Diagnosis not present

## 2019-07-26 DIAGNOSIS — L821 Other seborrheic keratosis: Secondary | ICD-10-CM | POA: Diagnosis not present

## 2019-07-26 DIAGNOSIS — D485 Neoplasm of uncertain behavior of skin: Secondary | ICD-10-CM | POA: Diagnosis not present

## 2019-07-26 DIAGNOSIS — L814 Other melanin hyperpigmentation: Secondary | ICD-10-CM | POA: Diagnosis not present

## 2019-07-26 DIAGNOSIS — C44612 Basal cell carcinoma of skin of right upper limb, including shoulder: Secondary | ICD-10-CM | POA: Diagnosis not present

## 2019-07-26 DIAGNOSIS — D1801 Hemangioma of skin and subcutaneous tissue: Secondary | ICD-10-CM | POA: Diagnosis not present

## 2019-08-02 DIAGNOSIS — I10 Essential (primary) hypertension: Secondary | ICD-10-CM | POA: Diagnosis not present

## 2019-08-03 LAB — BASIC METABOLIC PANEL
BUN/Creatinine Ratio: 13 (ref 10–24)
BUN: 12 mg/dL (ref 8–27)
CO2: 22 mmol/L (ref 20–29)
Calcium: 9.1 mg/dL (ref 8.6–10.2)
Chloride: 104 mmol/L (ref 96–106)
Creatinine, Ser: 0.89 mg/dL (ref 0.76–1.27)
GFR calc Af Amer: 107 mL/min/{1.73_m2} (ref >59)
GFR calc non Af Amer: 92 mL/min/{1.73_m2} (ref >59)
Glucose: 102 mg/dL — ABNORMAL HIGH (ref 65–99)
Potassium: 4.2 mmol/L (ref 3.5–5.2)
Sodium: 137 mmol/L (ref 134–144)

## 2019-08-05 ENCOUNTER — Telehealth: Payer: Self-pay

## 2019-08-05 NOTE — Telephone Encounter (Signed)
Left message to inform patient labs were ok, copy sent to Dr. Ainsley Spinner.

## 2019-08-05 NOTE — Telephone Encounter (Signed)
-----   Message from Jenean Lindau, MD sent at 08/03/2019  8:08 AM EST ----- The results of the study is unremarkable. Please inform patient. I will discuss in detail at next appointment. Cc  primary care/referring physician Jenean Lindau, MD 08/03/2019 8:08 AM

## 2019-08-17 DIAGNOSIS — C44612 Basal cell carcinoma of skin of right upper limb, including shoulder: Secondary | ICD-10-CM | POA: Diagnosis not present

## 2019-10-11 ENCOUNTER — Other Ambulatory Visit: Payer: Self-pay | Admitting: Cardiology

## 2019-11-15 DIAGNOSIS — L570 Actinic keratosis: Secondary | ICD-10-CM | POA: Diagnosis not present

## 2019-11-15 DIAGNOSIS — L814 Other melanin hyperpigmentation: Secondary | ICD-10-CM | POA: Diagnosis not present

## 2019-11-15 DIAGNOSIS — L905 Scar conditions and fibrosis of skin: Secondary | ICD-10-CM | POA: Diagnosis not present

## 2019-11-15 DIAGNOSIS — L821 Other seborrheic keratosis: Secondary | ICD-10-CM | POA: Diagnosis not present

## 2019-11-23 DIAGNOSIS — R972 Elevated prostate specific antigen [PSA]: Secondary | ICD-10-CM | POA: Diagnosis not present

## 2019-11-30 DIAGNOSIS — R972 Elevated prostate specific antigen [PSA]: Secondary | ICD-10-CM | POA: Diagnosis not present

## 2019-12-24 ENCOUNTER — Encounter: Payer: Self-pay | Admitting: Cardiology

## 2020-01-27 ENCOUNTER — Ambulatory Visit: Payer: BLUE CROSS/BLUE SHIELD | Admitting: Cardiology

## 2020-02-01 ENCOUNTER — Ambulatory Visit: Payer: BLUE CROSS/BLUE SHIELD | Admitting: Cardiology

## 2020-02-25 ENCOUNTER — Ambulatory Visit: Payer: BLUE CROSS/BLUE SHIELD | Admitting: Cardiology

## 2020-02-25 ENCOUNTER — Other Ambulatory Visit: Payer: Self-pay

## 2020-02-25 ENCOUNTER — Encounter: Payer: Self-pay | Admitting: Cardiology

## 2020-02-25 VITALS — BP 142/80 | HR 80 | Ht 71.0 in | Wt 246.0 lb

## 2020-02-25 DIAGNOSIS — I10 Essential (primary) hypertension: Secondary | ICD-10-CM | POA: Diagnosis not present

## 2020-02-25 NOTE — Progress Notes (Signed)
Cardiology Office Note:    Date:  02/25/2020   ID:  Andres [name], DOB 1956/05/24, MRN [id number]  PCP:  Mackie Pai, PA-C  Cardiologist:  Jenean Lindau, MD   Referring MD: Mackie Pai, PA-C    ASSESSMENT:    1. Essential hypertension    PLAN:    In order of problems listed above:  1. Primary prevention stressed with the patient.  Importance of compliance with diet medication stressed and he vocalized understanding.  His abdominal obesity and risks were explained and he promises to do better.  He is already involved in an exercise program trying to do better. 2. Essential hypertension: Blood pressure stable.  I went through the details of his salt intake and they are optimal. 3. Lipid status was evaluated and discussed with him.  It looks fine. 4. Patient will be seen in follow-up appointment in 6 months or earlier if the patient has any concerns    Medication Adjustments/Labs and Tests Ordered: Current medicines are reviewed at length with the patient today.  Concerns regarding medicines are outlined above.  No orders of the defined types were placed in this encounter.  No orders of the defined types were placed in this encounter.    Chief Complaint  Patient presents with  . Follow-up     History of Present Illness:    Andres [name] is a 64 y.o. male.  Patient has past medical history of essential hypertension he denies any problems at this time and takes care of activities of daily living.  No chest pain orthopnea or PND.  At the time of my evaluation, the patient is alert awake oriented and in no distress.  He is exercising on a regular basis.  Past Medical History:  Diagnosis Date  . Basal cell carcinoma   . Hypertension     History reviewed. No pertinent surgical history.  Current Medications: Current Meds  Medication Sig  . Ascorbic Acid (VITAMIN C) 500 MG CHEW Chew 1 tablet by mouth daily.  Marland Kitchen aspirin 81 MG chewable tablet Chew 1 tablet  (81 mg total) by mouth daily.  . B Complex Vitamins (B-COMPLEX/B-12) TABS Take 1 tablet by mouth daily.  Marland Kitchen losartan (COZAAR) 100 MG tablet TAKE 1 TABLET BY MOUTH EVERY DAY  . Omega-3 Fatty Acids (FISH OIL) 1000 MG CAPS Take 1 capsule by mouth daily.     Allergies:   Niacin and related   Social History   Socioeconomic History  . Marital status: Married    Spouse name: Not on file  . Number of children: Not on file  . Years of education: Not on file  . Highest education level: Not on file  Occupational History  . Not on file  Tobacco Use  . Smoking status: Former Research scientist (life sciences)  . Smokeless tobacco: Never Used  Vaping Use  . Vaping Use: Never used  Substance and Sexual Activity  . Alcohol use: Not Currently  . Drug use: Never  . Sexual activity: Yes  Other Topics Concern  . Not on file  Social History Narrative  . Not on file   Social Determinants of Health   Financial Resource Strain:   . Difficulty of Paying Living Expenses:   Food Insecurity:   . Worried About Charity fundraiser in the Last Year:   . Arboriculturist in the Last Year:   Transportation Needs:   . Film/video editor (Medical):   Marland Kitchen Lack of Transportation (Non-Medical):   Physical Activity:   .  Days of Exercise per Week:   . Minutes of Exercise per Session:   Stress:   . Feeling of Stress :   Social Connections:   . Frequency of Communication with Friends and Family:   . Frequency of Social Gatherings with Friends and Family:   . Attends Religious Services:   . Active Member of Clubs or Organizations:   . Attends Archivist Meetings:   Marland Kitchen Marital Status:      Family History: The patient's family history is not on file.  ROS:   Please see the history of present illness.    All other systems reviewed and are negative.  EKGs/Labs/Other Studies Reviewed:    The following studies were reviewed today: I discussed my findings with the patient at length   Recent Labs: 04/05/2019: ALT  12 08/02/2019: BUN 12; Creatinine, Ser 0.89; Potassium 4.2; Sodium 137  Recent Lipid Panel    Component Value Date/Time   CHOL 161 04/05/2019 0717   TRIG 139.0 04/05/2019 0717   HDL 38.70 (L) 04/05/2019 0717   CHOLHDL 4 04/05/2019 0717   VLDL 27.8 04/05/2019 0717   LDLCALC 94 04/05/2019 0717    Physical Exam:    VS:  BP (!) 142/80   Pulse 80   Ht 5\' 11"  (1.803 m)   Wt 246 lb (111.6 kg)   SpO2 96%   BMI 34.31 kg/m     Wt Readings from Last 3 Encounters:  02/25/20 246 lb (111.6 kg)  07/19/19 255 lb 1.3 oz (115.7 kg)  05/04/19 255 lb 12.8 oz (116 kg)     GEN: Patient is in no acute distress HEENT: Normal NECK: No JVD; No carotid bruits LYMPHATICS: No lymphadenopathy CARDIAC: Hear sounds regular, 2/6 systolic murmur at the apex. RESPIRATORY:  Clear to auscultation without rales, wheezing or rhonchi  ABDOMEN: Soft, non-tender, non-distended MUSCULOSKELETAL:  No edema; No deformity  SKIN: Warm and dry NEUROLOGIC:  Alert and oriented x 3 PSYCHIATRIC:  Normal affect   Signed, Jenean Lindau, MD  02/25/2020 2:17 PM    [facility]

## 2020-02-25 NOTE — Patient Instructions (Signed)
Medication Instructions:  No medication changes. *If you need a refill on your cardiac medications before your next appointment, please call your pharmacy*   Lab Work: None ordered If you have labs (blood work) drawn today and your tests are completely normal, you will receive your results only by: Marland Kitchen MyChart Message (if you have MyChart) OR . A paper copy in the mail If you have any lab test that is abnormal or we need to change your treatment, we will call you to review the results.   Testing/Procedures: None ordered   Follow-Up: At [facility], you and your health needs are our priority.  As part of our continuing mission to provide you with exceptional heart care, we have created designated Provider Care Teams.  These Care Teams include your primary Cardiologist (physician) and Advanced Practice Providers (APPs -  Physician Assistants and Nurse Practitioners) who all work together to provide you with the care you need, when you need it.  We recommend signing up for the patient portal called "MyChart".  Sign up information is provided on this After Visit Summary.  MyChart is used to connect with patients for Virtual Visits (Telemedicine).  Patients are able to view lab/test results, encounter notes, upcoming appointments, etc.  Non-urgent messages can be sent to your provider as well.   To learn more about what6 m  you can do with MyChart, go to [email or web address].    Your next appointment:   6 month(s)  The format for your next appointment:   In Person  Provider:   Jyl Heinz, MD   Other Instructions NA

## 2020-04-24 ENCOUNTER — Ambulatory Visit: Payer: BLUE CROSS/BLUE SHIELD | Admitting: Medical

## 2020-04-24 ENCOUNTER — Encounter: Payer: Self-pay | Admitting: Medical

## 2020-04-24 ENCOUNTER — Other Ambulatory Visit: Payer: Self-pay

## 2020-04-24 VITALS — BP 129/70 | HR 65 | Temp 98.1°F | Resp 18 | Ht 71.0 in | Wt 245.4 lb

## 2020-04-24 DIAGNOSIS — I10 Essential (primary) hypertension: Secondary | ICD-10-CM | POA: Diagnosis not present

## 2020-04-24 NOTE — Progress Notes (Signed)
Subjective:    Patient ID: Andres [name], male    DOB: 1955-12-03, 64 y.o.   MRN: [id number]  HPI Pt in for follow up.  Pt has not got covid vaccine.  This  morning at home when he checks his bp got 120/80.  Pt states 2 weeks ago he had poison ivy. He states his bp increase at that time was on methyprednisone and gained some weight. No dyspnea or obvious pedal edema.  Pt states poison ivy did clear up for most part.  Pt has some rt knee pain on and off.  Thru work they are advising therapy by [employer]. Done thru his work. He states exercises he is doing helps. 10 years ago had xray. I don't have those results.  Elevated psa in past/velocity increased. Pt saw urologist and he states everything looked good and urologist him       Review of Systems  Constitutional: Negative for chills, fatigue and fever.  Respiratory: Negative for cough, chest tightness, shortness of breath and wheezing.   Cardiovascular: Negative for chest pain and palpitations.  Gastrointestinal: Negative for abdominal pain.  Genitourinary: Negative for difficulty urinating, dysuria, flank pain, frequency, genital sores, testicular pain and urgency.  Musculoskeletal: Negative for back pain.  Skin: Negative for rash.  Neurological: Negative for dizziness, seizures, weakness and headaches.  Hematological: Negative for adenopathy. Does not bruise/bleed easily.  Psychiatric/Behavioral: Negative for behavioral problems and confusion.    Past Medical History:  Diagnosis Date  . Basal cell carcinoma   . Hypertension      Social History   Socioeconomic History  . Marital status: Married    Spouse name: Not on file  . Number of children: Not on file  . Years of education: Not on file  . Highest education level: Not on file  Occupational History  . Not on file  Tobacco Use  . Smoking status: Former Research scientist (life sciences)  . Smokeless tobacco: Never Used  Vaping Use  . Vaping Use: Never used  Substance and  Sexual Activity  . Alcohol use: Not Currently  . Drug use: Never  . Sexual activity: Yes  Other Topics Concern  . Not on file  Social History Narrative  . Not on file   Social Determinants of Health   Financial Resource Strain:   . Difficulty of Paying Living Expenses: Not on file  Food Insecurity:   . Worried About Charity fundraiser in the Last Year: Not on file  . Ran Out of Food in the Last Year: Not on file  Transportation Needs:   . Lack of Transportation (Medical): Not on file  . Lack of Transportation (Non-Medical): Not on file  Physical Activity:   . Days of Exercise per Week: Not on file  . Minutes of Exercise per Session: Not on file  Stress:   . Feeling of Stress : Not on file  Social Connections:   . Frequency of Communication with Friends and Family: Not on file  . Frequency of Social Gatherings with Friends and Family: Not on file  . Attends Religious Services: Not on file  . Active Member of Clubs or Organizations: Not on file  . Attends Archivist Meetings: Not on file  . Marital Status: Not on file  Intimate Partner Violence:   . Fear of Current or Ex-Partner: Not on file  . Emotionally Abused: Not on file  . Physically Abused: Not on file  . Sexually Abused: Not on file  No past surgical history on file.  No family history on file.  Allergies  Allergen Reactions  . Niacin And Related Shortness Of Breath    Current Outpatient Medications on File Prior to Visit  Medication Sig Dispense Refill  . Ascorbic Acid (VITAMIN C) 500 MG CHEW Chew 1 tablet by mouth daily.    Marland Kitchen aspirin 81 MG chewable tablet Chew 1 tablet (81 mg total) by mouth daily.    . B Complex Vitamins (B-COMPLEX/B-12) TABS Take 1 tablet by mouth daily.    Marland Kitchen losartan (COZAAR) 100 MG tablet TAKE 1 TABLET BY MOUTH EVERY DAY 90 tablet 2  . Omega-3 Fatty Acids (FISH OIL) 1000 MG CAPS Take 1 capsule by mouth daily.     No current facility-administered medications on file prior  to visit.    BP 129/70   Pulse 65   Temp 98.1 F (36.7 C) (Oral)   Resp 18   Ht 5\' 11"  (1.803 m)   Wt 245 lb 6.4 oz (111.3 kg)   SpO2 98%   BMI 34.23 kg/m       Objective:   Physical Exam  General Mental Status- Alert. General Appearance- Not in acute distress.   Skin General: Color- Normal Color. Moisture- Normal Moisture.  Neck Carotid Arteries- Normal color. Moisture- Normal Moisture. No carotid bruits. No JVD.  Chest and Lung Exam Auscultation: Breath Sounds:-Normal.  Cardiovascular Auscultation:Rythm- Regular. Murmurs & Other Heart Sounds:Auscultation of the heart reveals- No Murmurs.  Abdomen Inspection:-Inspeection Normal. Palpation/Percussion:Note:No mass. Palpation and Percussion of the abdomen reveal- Non Tender, Non Distended + BS, no rebound or guarding.   Neurologic Cranial Nerve exam:- CN III-XII intact(No nystagmus), symmetric smile. Strength:- 5/5 equal and symmetric strength both upper and lower extremities.      Assessment & Plan:  Your blood pressure is good today when I checked levels.   Hx of low ldl and high cholesterol levels. Would ask if they can add cmp and lipid panel to blood work that they will draw tomorrow.  For elevated sugar in past continue low sugar diet.  Tdap declined. Can get in future as nurse visit.  Continue diet and exercise. Try to lose weight.  Please see if work lab will add above labs. Please bring copies to our office.  Follow up date to be determined after lab review.  Mackie Pai, PA-C   Time spent with patient today was 25  minutes which consisted of chart review, discussing diagnosis, work up requested, treatment and documentation.

## 2020-04-24 NOTE — Patient Instructions (Addendum)
Your blood pressure is good today when I checked levels.   Hx of low ldl and high cholesterol levels. Would ask if they can add cmp and lipid panel to blood work that they will draw tomorrow.  For elevated sugar in past continue low sugar diet.  Tdap declined. Can get in future as nurse visit.  Continue diet and exercise. Try to lose weight.  Please see if work lab will add above labs. Please bring copies to our office.  Follow up date to be determined after lab review.

## 2020-04-25 ENCOUNTER — Encounter: Payer: Self-pay | Admitting: Medical

## 2020-04-26 NOTE — Addendum Note (Signed)
Addended by: Anabel Halon on: 04/26/2020 05:05 PM   Modules accepted: Orders

## 2020-05-01 ENCOUNTER — Other Ambulatory Visit: Payer: BLUE CROSS/BLUE SHIELD

## 2020-05-01 ENCOUNTER — Other Ambulatory Visit: Payer: Self-pay

## 2020-05-01 DIAGNOSIS — I10 Essential (primary) hypertension: Secondary | ICD-10-CM

## 2020-05-01 NOTE — Addendum Note (Signed)
Addended by: Kelle Darting A on: 05/01/2020 07:54 AM   Modules accepted: Orders

## 2020-05-02 LAB — COMPREHENSIVE METABOLIC PANEL
AG Ratio: 1.2 (calc) (ref 1.0–2.5)
ALT: 12 U/L (ref 9–46)
AST: 12 U/L (ref 10–35)
Albumin: 3.7 g/dL (ref 3.6–5.1)
Alkaline phosphatase (APISO): 50 U/L (ref 35–144)
BUN: 14 mg/dL (ref 7–25)
CO2: 24 mmol/L (ref 20–32)
Calcium: 9 mg/dL (ref 8.6–10.3)
Chloride: 101 mmol/L (ref 98–110)
Creat: 1 mg/dL (ref 0.70–1.25)
Globulin: 3.1 g/dL (calc) (ref 1.9–3.7)
Glucose, Bld: 92 mg/dL (ref 65–99)
Potassium: 4.3 mmol/L (ref 3.5–5.3)
Sodium: 135 mmol/L (ref 135–146)
Total Bilirubin: 0.7 mg/dL (ref 0.2–1.2)
Total Protein: 6.8 g/dL (ref 6.1–8.1)

## 2020-05-02 LAB — CBC WITH DIFFERENTIAL/PLATELET
Absolute Monocytes: 395 cells/uL (ref 200–950)
Basophils Absolute: 52 cells/uL (ref 0–200)
Basophils Relative: 1 %
Eosinophils Absolute: 208 cells/uL (ref 15–500)
Eosinophils Relative: 4 %
HCT: 37.6 % — ABNORMAL LOW (ref 38.5–50.0)
Hemoglobin: 12.8 g/dL — ABNORMAL LOW (ref 13.2–17.1)
Lymphs Abs: 2314 cells/uL (ref 850–3900)
MCH: 29.3 pg (ref 27.0–33.0)
MCHC: 34 g/dL (ref 32.0–36.0)
MCV: 86 fL (ref 80.0–100.0)
MPV: 10 fL (ref 7.5–12.5)
Monocytes Relative: 7.6 %
Neutro Abs: 2231 cells/uL (ref 1500–7800)
Neutrophils Relative %: 42.9 %
Platelets: 259 10*3/uL (ref 140–400)
RBC: 4.37 10*6/uL (ref 4.20–5.80)
RDW: 13.1 % (ref 11.0–15.0)
Total Lymphocyte: 44.5 %
WBC: 5.2 10*3/uL (ref 3.8–10.8)

## 2020-05-22 DIAGNOSIS — D225 Melanocytic nevi of trunk: Secondary | ICD-10-CM | POA: Diagnosis not present

## 2020-05-22 DIAGNOSIS — L821 Other seborrheic keratosis: Secondary | ICD-10-CM | POA: Diagnosis not present

## 2020-05-22 DIAGNOSIS — X32XXXS Exposure to sunlight, sequela: Secondary | ICD-10-CM | POA: Diagnosis not present

## 2020-05-22 DIAGNOSIS — L814 Other melanin hyperpigmentation: Secondary | ICD-10-CM | POA: Diagnosis not present

## 2020-07-12 ENCOUNTER — Other Ambulatory Visit: Payer: Self-pay | Admitting: Cardiology

## 2020-08-11 ENCOUNTER — Ambulatory Visit: Payer: BLUE CROSS/BLUE SHIELD | Admitting: Cardiology

## 2020-09-28 ENCOUNTER — Ambulatory Visit: Payer: BLUE CROSS/BLUE SHIELD | Admitting: Cardiology

## 2020-10-05 ENCOUNTER — Emergency Department (HOSPITAL_BASED_OUTPATIENT_CLINIC_OR_DEPARTMENT_OTHER): Payer: Worker's Compensation

## 2020-10-05 ENCOUNTER — Inpatient Hospital Stay (HOSPITAL_BASED_OUTPATIENT_CLINIC_OR_DEPARTMENT_OTHER)
Admission: EM | Admit: 2020-10-05 | Discharge: 2020-10-20 | DRG: 579 | Disposition: A | Discharge status: Rehabilitation Facility | Payer: Worker's Compensation | Attending: Internal Medicine | Admitting: Internal Medicine

## 2020-10-05 ENCOUNTER — Other Ambulatory Visit: Payer: Self-pay

## 2020-10-05 ENCOUNTER — Encounter: Payer: Self-pay | Admitting: Family Medicine

## 2020-10-05 ENCOUNTER — Ambulatory Visit: Payer: BLUE CROSS/BLUE SHIELD | Admitting: Family Medicine

## 2020-10-05 ENCOUNTER — Encounter (HOSPITAL_BASED_OUTPATIENT_CLINIC_OR_DEPARTMENT_OTHER): Payer: Self-pay | Admitting: Emergency Medicine

## 2020-10-05 VITALS — BP 124/72 | HR 83 | Temp 99.1°F | Resp 19

## 2020-10-05 DIAGNOSIS — L089 Local infection of the skin and subcutaneous tissue, unspecified: Secondary | ICD-10-CM | POA: Diagnosis not present

## 2020-10-05 DIAGNOSIS — U071 COVID-19: Secondary | ICD-10-CM | POA: Diagnosis present

## 2020-10-05 DIAGNOSIS — G546 Phantom limb syndrome with pain: Secondary | ICD-10-CM | POA: Diagnosis not present

## 2020-10-05 DIAGNOSIS — M726 Necrotizing fasciitis: Secondary | ICD-10-CM | POA: Diagnosis not present

## 2020-10-05 DIAGNOSIS — L02415 Cutaneous abscess of right lower limb: Secondary | ICD-10-CM

## 2020-10-05 DIAGNOSIS — G8918 Other acute postprocedural pain: Secondary | ICD-10-CM | POA: Diagnosis not present

## 2020-10-05 DIAGNOSIS — B9562 Methicillin resistant Staphylococcus aureus infection as the cause of diseases classified elsewhere: Secondary | ICD-10-CM | POA: Diagnosis present

## 2020-10-05 DIAGNOSIS — E871 Hypo-osmolality and hyponatremia: Secondary | ICD-10-CM | POA: Diagnosis present

## 2020-10-05 DIAGNOSIS — L03115 Cellulitis of right lower limb: Secondary | ICD-10-CM

## 2020-10-05 DIAGNOSIS — D62 Acute posthemorrhagic anemia: Secondary | ICD-10-CM | POA: Diagnosis present

## 2020-10-05 DIAGNOSIS — I10 Essential (primary) hypertension: Secondary | ICD-10-CM | POA: Diagnosis not present

## 2020-10-05 DIAGNOSIS — L0390 Cellulitis, unspecified: Secondary | ICD-10-CM | POA: Diagnosis present

## 2020-10-05 DIAGNOSIS — M7989 Other specified soft tissue disorders: Secondary | ICD-10-CM | POA: Diagnosis present

## 2020-10-05 DIAGNOSIS — T380X5A Adverse effect of glucocorticoids and synthetic analogues, initial encounter: Secondary | ICD-10-CM | POA: Diagnosis not present

## 2020-10-05 DIAGNOSIS — N179 Acute kidney failure, unspecified: Secondary | ICD-10-CM | POA: Diagnosis not present

## 2020-10-05 DIAGNOSIS — G629 Polyneuropathy, unspecified: Secondary | ICD-10-CM | POA: Diagnosis present

## 2020-10-05 DIAGNOSIS — Z7982 Long term (current) use of aspirin: Secondary | ICD-10-CM | POA: Diagnosis not present

## 2020-10-05 DIAGNOSIS — M609 Myositis, unspecified: Secondary | ICD-10-CM | POA: Diagnosis not present

## 2020-10-05 DIAGNOSIS — I119 Hypertensive heart disease without heart failure: Secondary | ICD-10-CM | POA: Diagnosis not present

## 2020-10-05 DIAGNOSIS — D649 Anemia, unspecified: Secondary | ICD-10-CM | POA: Diagnosis not present

## 2020-10-05 DIAGNOSIS — Z23 Encounter for immunization: Secondary | ICD-10-CM

## 2020-10-05 DIAGNOSIS — Z85828 Personal history of other malignant neoplasm of skin: Secondary | ICD-10-CM | POA: Diagnosis not present

## 2020-10-05 DIAGNOSIS — Z79899 Other long term (current) drug therapy: Secondary | ICD-10-CM

## 2020-10-05 DIAGNOSIS — S81801A Unspecified open wound, right lower leg, initial encounter: Secondary | ICD-10-CM | POA: Diagnosis not present

## 2020-10-05 DIAGNOSIS — E861 Hypovolemia: Secondary | ICD-10-CM | POA: Diagnosis present

## 2020-10-05 DIAGNOSIS — Z888 Allergy status to other drugs, medicaments and biological substances status: Secondary | ICD-10-CM | POA: Diagnosis not present

## 2020-10-05 DIAGNOSIS — R7303 Prediabetes: Secondary | ICD-10-CM | POA: Diagnosis present

## 2020-10-05 DIAGNOSIS — T79A0XA Compartment syndrome, unspecified, initial encounter: Secondary | ICD-10-CM | POA: Diagnosis not present

## 2020-10-05 DIAGNOSIS — Z87891 Personal history of nicotine dependence: Secondary | ICD-10-CM

## 2020-10-05 DIAGNOSIS — Y990 Civilian activity done for income or pay: Secondary | ICD-10-CM

## 2020-10-05 DIAGNOSIS — S78111A Complete traumatic amputation at level between right hip and knee, initial encounter: Secondary | ICD-10-CM | POA: Diagnosis not present

## 2020-10-05 DIAGNOSIS — M79604 Pain in right leg: Secondary | ICD-10-CM | POA: Diagnosis not present

## 2020-10-05 DIAGNOSIS — L97119 Non-pressure chronic ulcer of right thigh with unspecified severity: Secondary | ICD-10-CM | POA: Diagnosis not present

## 2020-10-05 DIAGNOSIS — A419 Sepsis, unspecified organism: Secondary | ICD-10-CM | POA: Diagnosis not present

## 2020-10-05 DIAGNOSIS — F329 Major depressive disorder, single episode, unspecified: Secondary | ICD-10-CM | POA: Diagnosis not present

## 2020-10-05 DIAGNOSIS — I70231 Atherosclerosis of native arteries of right leg with ulceration of thigh: Secondary | ICD-10-CM | POA: Diagnosis not present

## 2020-10-05 DIAGNOSIS — Z8679 Personal history of other diseases of the circulatory system: Secondary | ICD-10-CM | POA: Diagnosis not present

## 2020-10-05 DIAGNOSIS — K5903 Drug induced constipation: Secondary | ICD-10-CM | POA: Diagnosis not present

## 2020-10-05 DIAGNOSIS — R600 Localized edema: Secondary | ICD-10-CM | POA: Diagnosis not present

## 2020-10-05 HISTORY — DX: Cellulitis of right lower limb: L03.115

## 2020-10-05 HISTORY — DX: COVID-19: U07.1

## 2020-10-05 HISTORY — DX: Cellulitis, unspecified: L03.90

## 2020-10-05 LAB — CBC
HCT: 31.7 % — ABNORMAL LOW (ref 39.0–52.0)
Hemoglobin: 10.8 g/dL — ABNORMAL LOW (ref 13.0–17.0)
MCH: 29.4 pg (ref 26.0–34.0)
MCHC: 34.1 g/dL (ref 30.0–36.0)
MCV: 86.4 fL (ref 80.0–100.0)
Platelets: 195 10*3/uL (ref 150–400)
RBC: 3.67 MIL/uL — ABNORMAL LOW (ref 4.22–5.81)
RDW: 14 % (ref 11.5–15.5)
WBC: 9.4 10*3/uL (ref 4.0–10.5)
nRBC: 0 % (ref 0.0–0.2)

## 2020-10-05 LAB — CBC WITH DIFFERENTIAL/PLATELET
Abs Immature Granulocytes: 0.05 10*3/uL (ref 0.00–0.07)
Basophils Absolute: 0 10*3/uL (ref 0.0–0.1)
Basophils Relative: 0 %
Eosinophils Absolute: 0 10*3/uL (ref 0.0–0.5)
Eosinophils Relative: 0 %
HCT: 35 % — ABNORMAL LOW (ref 39.0–52.0)
Hemoglobin: 12 g/dL — ABNORMAL LOW (ref 13.0–17.0)
Immature Granulocytes: 1 %
Lymphocytes Relative: 9 %
Lymphs Abs: 0.9 10*3/uL (ref 0.7–4.0)
MCH: 29.2 pg (ref 26.0–34.0)
MCHC: 34.3 g/dL (ref 30.0–36.0)
MCV: 85.2 fL (ref 80.0–100.0)
Monocytes Absolute: 0.6 10*3/uL (ref 0.1–1.0)
Monocytes Relative: 6 %
Neutro Abs: 9.2 10*3/uL — ABNORMAL HIGH (ref 1.7–7.7)
Neutrophils Relative %: 84 %
Platelets: 211 10*3/uL (ref 150–400)
RBC: 4.11 MIL/uL — ABNORMAL LOW (ref 4.22–5.81)
RDW: 13.8 % (ref 11.5–15.5)
WBC: 10.8 10*3/uL — ABNORMAL HIGH (ref 4.0–10.5)
nRBC: 0 % (ref 0.0–0.2)

## 2020-10-05 LAB — URINALYSIS, ROUTINE W REFLEX MICROSCOPIC
Bilirubin Urine: NEGATIVE
Glucose, UA: NEGATIVE mg/dL
Hgb urine dipstick: NEGATIVE
Ketones, ur: NEGATIVE mg/dL
Leukocytes,Ua: NEGATIVE
Nitrite: NEGATIVE
Protein, ur: 30 mg/dL — AB
Specific Gravity, Urine: 1.01 (ref 1.005–1.030)
pH: 6 (ref 5.0–8.0)

## 2020-10-05 LAB — BASIC METABOLIC PANEL
Anion gap: 11 (ref 5–15)
BUN: 12 mg/dL (ref 8–23)
CO2: 21 mmol/L — ABNORMAL LOW (ref 22–32)
Calcium: 7.4 mg/dL — ABNORMAL LOW (ref 8.9–10.3)
Chloride: 94 mmol/L — ABNORMAL LOW (ref 98–111)
Creatinine, Ser: 1.18 mg/dL (ref 0.61–1.24)
GFR, Estimated: >60 mL/min (ref >60)
Glucose, Bld: 103 mg/dL — ABNORMAL HIGH (ref 70–99)
Potassium: 3.5 mmol/L (ref 3.5–5.1)
Sodium: 126 mmol/L — ABNORMAL LOW (ref 135–145)

## 2020-10-05 LAB — COMPREHENSIVE METABOLIC PANEL
ALT: 35 U/L (ref 0–44)
AST: 28 U/L (ref 15–41)
Albumin: 3.2 g/dL — ABNORMAL LOW (ref 3.5–5.0)
Alkaline Phosphatase: 51 U/L (ref 38–126)
Anion gap: 12 (ref 5–15)
BUN: 15 mg/dL (ref 8–23)
CO2: 21 mmol/L — ABNORMAL LOW (ref 22–32)
Calcium: 8 mg/dL — ABNORMAL LOW (ref 8.9–10.3)
Chloride: 90 mmol/L — ABNORMAL LOW (ref 98–111)
Creatinine, Ser: 1.2 mg/dL (ref 0.61–1.24)
GFR, Estimated: >60 mL/min (ref >60)
Glucose, Bld: 113 mg/dL — ABNORMAL HIGH (ref 70–99)
Potassium: 3.7 mmol/L (ref 3.5–5.1)
Sodium: 123 mmol/L — ABNORMAL LOW (ref 135–145)
Total Bilirubin: 0.4 mg/dL (ref 0.3–1.2)
Total Protein: 7.4 g/dL (ref 6.5–8.1)

## 2020-10-05 LAB — URINALYSIS, MICROSCOPIC (REFLEX)

## 2020-10-05 LAB — FERRITIN: Ferritin: 179 ng/mL (ref 24–336)

## 2020-10-05 LAB — PROTIME-INR
INR: 1.1 (ref 0.8–1.2)
Prothrombin Time: 14.2 seconds (ref 11.4–15.2)

## 2020-10-05 LAB — APTT: aPTT: 34 seconds (ref 24–36)

## 2020-10-05 LAB — D-DIMER, QUANTITATIVE: D-Dimer, Quant: 1.08 ug/mL-FEU — ABNORMAL HIGH (ref 0.00–0.50)

## 2020-10-05 LAB — C-REACTIVE PROTEIN: CRP: 26.4 mg/dL — ABNORMAL HIGH (ref <1.0)

## 2020-10-05 LAB — LACTIC ACID, PLASMA: Lactic Acid, Venous: 1.3 mmol/L (ref 0.5–1.9)

## 2020-10-05 LAB — SARS CORONAVIRUS 2 BY RT PCR (HOSPITAL ORDER, PERFORMED IN ~~LOC~~ HOSPITAL LAB): SARS Coronavirus 2: POSITIVE — AB

## 2020-10-05 LAB — SODIUM, URINE, RANDOM: Sodium, Ur: <10 mmol/L

## 2020-10-05 MED ORDER — ASCORBIC ACID 500 MG PO TABS
500.0000 mg | ORAL_TABLET | Freq: Every day | ORAL | Status: DC
  Administered 2020-10-05 – 2020-10-20 (×15): 500 mg via ORAL
  Filled 2020-10-05 (×15): qty 1

## 2020-10-05 MED ORDER — LOSARTAN POTASSIUM 50 MG PO TABS: 100.0000 mg | ORAL_TABLET | Freq: Every day | ORAL | Status: DC

## 2020-10-05 MED ORDER — MORPHINE SULFATE (PF) 4 MG/ML IV SOLN: 4.0000 mg | Freq: Once | INTRAVENOUS | Status: DC

## 2020-10-05 MED ORDER — ZINC SULFATE 220 (50 ZN) MG PO CAPS
220.0000 mg | ORAL_CAPSULE | Freq: Every day | ORAL | Status: DC
  Administered 2020-10-05 – 2020-10-20 (×15): 220 mg via ORAL
  Filled 2020-10-05 (×15): qty 1

## 2020-10-05 MED ORDER — ENOXAPARIN SODIUM 60 MG/0.6ML ~~LOC~~ SOLN
60.0000 mg | SUBCUTANEOUS | Status: DC
  Administered 2020-10-05 – 2020-10-10 (×6): 60 mg via SUBCUTANEOUS
  Filled 2020-10-05 (×6): qty 0.6

## 2020-10-05 MED ORDER — SODIUM CHLORIDE 0.9 % IV SOLN: 100.0000 mg | Freq: Every day | INTRAVENOUS | Status: DC

## 2020-10-05 MED ORDER — VANCOMYCIN HCL IN DEXTROSE 1-5 GM/200ML-% IV SOLN
1000.0000 mg | Freq: Once | INTRAVENOUS | Status: AC
  Administered 2020-10-05: 1000 mg via INTRAVENOUS
  Filled 2020-10-05: qty 200

## 2020-10-05 MED ORDER — LACTATED RINGERS IV SOLN: INTRAVENOUS | Status: AC

## 2020-10-05 MED ORDER — IBUPROFEN 800 MG PO TABS
800.0000 mg | ORAL_TABLET | Freq: Once | ORAL | Status: AC
  Administered 2020-10-05: 800 mg via ORAL
  Filled 2020-10-05: qty 1

## 2020-10-05 MED ORDER — SODIUM CHLORIDE 0.9 % IV SOLN
2.0000 g | Freq: Once | INTRAVENOUS | Status: AC
  Administered 2020-10-05: 2 g via INTRAVENOUS
  Filled 2020-10-05: qty 2

## 2020-10-05 MED ORDER — MORPHINE SULFATE (PF) 4 MG/ML IV SOLN
4.0000 mg | Freq: Once | INTRAVENOUS | Status: AC
  Administered 2020-10-05: 4 mg via INTRAVENOUS
  Filled 2020-10-05: qty 1

## 2020-10-05 MED ORDER — VANCOMYCIN HCL IN DEXTROSE 1-5 GM/200ML-% IV SOLN: 1000.0000 mg | Freq: Once | INTRAVENOUS | Status: DC

## 2020-10-05 MED ORDER — ACETAMINOPHEN 500 MG PO TABS
1000.0000 mg | ORAL_TABLET | Freq: Once | ORAL | Status: AC
  Administered 2020-10-05: 1000 mg via ORAL
  Filled 2020-10-05: qty 2

## 2020-10-05 MED ORDER — VANCOMYCIN HCL 1500 MG/300ML IV SOLN
1500.0000 mg | INTRAVENOUS | Status: DC
  Filled 2020-10-05: qty 300

## 2020-10-05 MED ORDER — ONDANSETRON HCL 4 MG/2ML IJ SOLN
4.0000 mg | Freq: Once | INTRAMUSCULAR | Status: AC
  Administered 2020-10-05: 4 mg via INTRAVENOUS
  Filled 2020-10-05: qty 2

## 2020-10-05 MED ORDER — LACTATED RINGERS IV BOLUS (SEPSIS)
1000.0000 mL | Freq: Once | INTRAVENOUS | Status: AC
  Administered 2020-10-05: 1000 mL via INTRAVENOUS

## 2020-10-05 MED ORDER — SODIUM CHLORIDE 0.9 % IV SOLN
2.0000 g | Freq: Three times a day (TID) | INTRAVENOUS | Status: DC
  Administered 2020-10-05 – 2020-10-06 (×3): 2 g via INTRAVENOUS
  Filled 2020-10-05 (×3): qty 2

## 2020-10-05 MED ORDER — SODIUM CHLORIDE 0.9 % IV SOLN
100.0000 mg | Freq: Every day | INTRAVENOUS | Status: AC
  Administered 2020-10-06 – 2020-10-07 (×2): 100 mg via INTRAVENOUS
  Filled 2020-10-05 (×2): qty 20

## 2020-10-05 MED ORDER — ASPIRIN 81 MG PO CHEW
81.0000 mg | CHEWABLE_TABLET | Freq: Every day | ORAL | Status: DC
  Administered 2020-10-05 – 2020-10-20 (×14): 81 mg via ORAL
  Filled 2020-10-05 (×14): qty 1

## 2020-10-05 MED ORDER — SODIUM CHLORIDE 0.9 % IV SOLN
INTRAVENOUS | Status: DC | PRN
  Administered 2020-10-05: 500 mL via INTRAVENOUS

## 2020-10-05 MED ORDER — SODIUM CHLORIDE 0.9 % IV SOLN
100.0000 mg | INTRAVENOUS | Status: AC
Start: 2020-10-05 — End: 2020-10-05
  Administered 2020-10-05 (×2): 100 mg via INTRAVENOUS

## 2020-10-05 MED ORDER — METRONIDAZOLE IN NACL 5-0.79 MG/ML-% IV SOLN
500.0000 mg | Freq: Once | INTRAVENOUS | Status: AC
  Administered 2020-10-05: 500 mg via INTRAVENOUS
  Filled 2020-10-05: qty 100

## 2020-10-05 MED ORDER — ACETAMINOPHEN 650 MG RE SUPP: 650.0000 mg | Freq: Four times a day (QID) | RECTAL | Status: DC | PRN

## 2020-10-05 MED ORDER — MORPHINE SULFATE (PF) 4 MG/ML IV SOLN
INTRAVENOUS | Status: AC
  Administered 2020-10-05: 4 mg via INTRAVENOUS
  Filled 2020-10-05: qty 1

## 2020-10-05 MED ORDER — ACETAMINOPHEN 325 MG PO TABS
650.0000 mg | ORAL_TABLET | Freq: Four times a day (QID) | ORAL | Status: DC | PRN
  Administered 2020-10-13: 16:00:00 650 mg via ORAL
  Filled 2020-10-05: qty 2

## 2020-10-05 MED ORDER — ORAL CARE MOUTH RINSE
15.0000 mL | Freq: Two times a day (BID) | OROMUCOSAL | Status: DC
  Administered 2020-10-05 – 2020-10-20 (×27): 15 mL via OROMUCOSAL

## 2020-10-05 MED ORDER — TETANUS-DIPHTH-ACELL PERTUSSIS 5-2.5-18.5 LF-MCG/0.5 IM SUSY
0.5000 mL | PREFILLED_SYRINGE | Freq: Once | INTRAMUSCULAR | Status: AC
  Administered 2020-10-05: 0.5 mL via INTRAMUSCULAR
  Filled 2020-10-05: qty 0.5

## 2020-10-05 NOTE — ED Provider Notes (Signed)
Patient signed out to me by previous provider.  Briefly this is a 65 year old male who is presenting with concern of right lower leg infection.  This is stemming from a scratch and injury at work.  He has been on an outpatient course of antibiotics with worsening redness and swelling of the right lower extremity.  Pending DVT studies. Physical Exam  BP 136/73   Pulse (!) 103   Temp (!) 101.8 F (38.8 C) (Oral)   Resp (!) 23   Ht 5\' 11"  (1.803 m)   Wt 107 kg   SpO2 96%   BMI 32.92 kg/m   Physical Exam  ED Course/Procedures     Procedures  MDM  DVT studies were negative, x-ray shows no subcutaneous gas.  Patient was febrile on arrival.  Concern for sepsis, lactic is normal.  Mild leukocytosis.  Patient denied any other previous symptoms but is noted to be Covid positive.  Patient is vaccinated for flu but not Covid.  He feels he may have had some congestion this week.  Patient was treated preemptively with IV antibiotics for right leg infection.  We will add on chest x-ray and other evaluation in regards to Covid.  Patients evaluation and results requires admission for further treatment and care. Patient agrees with admission plan, offers no new complaints and is stable/unchanged at time of admit.      Lorelle Gibbs, DO 10/05/20 1726

## 2020-10-05 NOTE — Patient Instructions (Signed)
We will have you seen in the ER now for severe leg infection.  I hope that you feel better soon!

## 2020-10-05 NOTE — Progress Notes (Signed)
[facility], [place], [place] [address] [phone]  Date:  10/05/2020   Name:  Andres [name]   DOB:  [birth year]   MRN:  [id number]  PCP:  Mackie Pai, PA-C    Chief Complaint: Leg Pain (Right leg pain, infection, started last week, evaluation with doctor at work, amoxicillin 10 days)   History of Present Illness:  Andres [name] is a 65 y.o. very pleasant male patient who presents with the following:  Pt here today with concern of possible leg infection Primary pt of Mackie Pai History of HTN, skin cancer   He is here today with concern of a leg infection- he saw his doctor at work and they started him on ?augmentin vs amox about 48 hours ago  Last week he ran into a piece of machinery at work - this past Sunday he noted the area seemed infected.   The infection seems to be getting worse despite treatment- he is feeling worse and the leg is very swollen and painful He felt ok yesterday- today he feels poorly in general- he feels achy and ill. He noted a temp to 101 He is having to use a cane to walk which is not normal for him  He works in Psychologist, educational        Patient Active Problem List   Diagnosis Date Noted  . Chest pain 11/08/2018  . Accelerated hypertension 11/08/2018  . Encounter to establish care 07/09/2017  . Essential hypertension 08/16/2015  . Sensorineural hearing loss (SNHL), bilateral 08/16/2015  . Impaired fasting glucose 08/23/2014  . Obesity (BMI 30-39.9) 08/23/2014  . History of nonmelanoma skin cancer 07/15/2012  . Allergic rhinitis 10/24/2011  . Basal cell carcinoma of back 07/10/2011    Past Medical History:  Diagnosis Date  . Basal cell carcinoma   . Hypertension     No past surgical history on file.  Social History   Tobacco Use  . Smoking status: Former Research scientist (life sciences)  . Smokeless tobacco: Never Used  Vaping Use  . Vaping Use: Never used  Substance  Use Topics  . Alcohol use: Not Currently  . Drug use: Never    No family history on file.  Allergies  Allergen Reactions  . Niacin And Related Shortness Of Breath    Medication list has been reviewed and updated.  Current Outpatient Medications on File Prior to Visit  Medication Sig Dispense Refill  . amoxicillin-clavulanate (AUGMENTIN) 875-125 MG tablet Take 1 tablet by mouth 2 (two) times daily.    . Ascorbic Acid (VITAMIN C) 500 MG CHEW Chew 1 tablet by mouth daily.    Marland Kitchen aspirin 81 MG chewable tablet Chew 1 tablet (81 mg total) by mouth daily.    . B Complex Vitamins (B-COMPLEX/B-12) TABS Take 1 tablet by mouth daily.    Marland Kitchen losartan (COZAAR) 100 MG tablet TAKE 1 TABLET BY MOUTH EVERY DAY 90 tablet 1  . Omega-3 Fatty Acids (FISH OIL) 1000 MG CAPS Take 1 capsule by mouth daily.     No current facility-administered medications on file prior to visit.    Review of Systems:  As per HPI- otherwise negative.   Physical Examination: Vitals:   10/05/20 1122  BP: 124/72  Pulse: 83  Resp: 19  Temp: 99.1 F (37.3 C)  SpO2: 97%   Vitals:   There is no height or weight on file to calculate BMI. Ideal Body Weight:    GEN:  no acute distress but is having diffuclty moving/ in [facility] today which is not normal for him  HEENT: Atraumatic, Normocephalic.  Ears and Nose: No external deformity. CV: RRR, No M/G/R. No JVD. No thrill. No extra heart sounds. PULM: CTA B, no wheezes, crackles, rhonchi. No retractions. No resp. distress. No accessory muscle use. EXTR: No c/c/e PSYCH: Normally interactive. Conversant.  Right lower leg is grossly swollen, red and warm with multiple sites of skin breakdown over the anterior tibia   Assessment and Plan: Cellulitis and abscess of right leg  Pt seen today with severe infection of the right lower leg. Requires a higher level of care than I can provide in the office.  Sent to ER with staff now for further eval and treatment   Signed Lamar Blinks, MD

## 2020-10-05 NOTE — Care Plan (Addendum)
Was called by ED, Dr. 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 Patient noted to have had a cut on his right shin at work which has progressively been worsening. Was treated with amoxicillin in the outpatient setting however no significant clinical improvement patient with ongoing fever and chills concern for impending sepsis. Lower extremity Dopplers done negative for DVT. Plain films of the right lower extremity negative for any tib-fib fracture. Patient received IV vancomycin, IV cefepime, IV Flagyl in the ED. Patient noted to also have a significant hyponatremia with a sodium level of 123. Blood cultures ordered and pending. COVID-19 PCR which was done was positive however patient not hypoxic by ED physician. Asked ED physician to kindly obtain chest x-ray, UA with cultures and sensitivities, urine sodium, CRP, ferritin, D-dimer and to start patient on a 3-day course of IV remdesivir. Patient accepted to [facility].  No charge.

## 2020-10-05 NOTE — Progress Notes (Signed)
Pharmacy Antibiotic Note  Andres [name] is a 65 y.o. male admitted on 10/05/2020 with cellulitis and poss sepsis.  Pharmacy has been consulted for vancomycin and cefepime dosing.  Plan: Vancomycin 1500 mg IV every 24 hours.  Goal trough 10-15 mcg/mL. Cefepime 2 gms IV every 8 hours  Height: 5\' 11"  (180.3 cm) Weight: 107 kg (236 lb) IBW/kg (Calculated) : 75.3  Temp (24hrs), Avg:100.6 F (38.1 C), Min:99.1 F (37.3 C), Max:102.4 F (39.1 C)  Recent Labs  Lab 10/05/20 1249 10/05/20 1309  WBC 10.8*  --   CREATININE 1.20  --   LATICACIDVEN  --  1.3    Estimated Creatinine Clearance: 79.4 mL/min (by C-G formula based on SCr of 1.2 mg/dL).    Allergies  Allergen Reactions  . Niacin And Related Shortness Of Breath    Antimicrobials this admission: Vancomycin 1 gm x 2 doses  Cefepime 2 gm IV x 1 Metronidazole 500 mg IV x 1    Microbiology results: Pending   Thank you for allowing pharmacy to be a part of this patient's care.  Cooper Render Quasim Doyon 10/05/2020 2:31 PM

## 2020-10-05 NOTE — Plan of Care (Signed)

## 2020-10-05 NOTE — ED Provider Notes (Signed)
[name] DEPARTMENT Provider Note   CSN: [id number] Arrival date & time: 10/05/20  1146     History Chief Complaint  Patient presents with  . Leg Pain    Andres [name] is a 65 y.o. male.  Patient is a 65 year old male with a history of hypertension, prior wound infection with MRSA who is presenting today with worsening right leg infection.  Patient reports last Wednesday approximately 8 days prior to arrival he was at work and hit his leg on a compressor which scraped the skin.  Over the weekend he started noticing some redness and saw the doctor at work on Tuesday and was placed on amoxicillin for concern for wound infection.  In the last 2 days things have worsened he started to have some chills and malaise as well as worsening redness, swelling and pain in the right leg.  Now the pain is spreading up past the knee and he saw his PCP today who sent him here for further care.  Patient denies any numbness or tingling in his lower extremity.  He has had some mild weeping over the scrape on his leg and has been light yellow in color.  There is been no excessive bleeding no prior history of blood clots.  In the past he has had wound infections and reports at times he has tested positive for MRSA but has not had any in a while.  No known history of diabetes.  He denies any recent chest pain or shortness of breath.  The history is provided by the patient.  Leg Pain Location:  Leg Time since incident:  1 week Injury: yes   Mechanism of injury comment:  Scraped it on a compressor at work Leg location:  R lower leg Pain details:    Quality:  Cramping, shooting and throbbing   Radiates to:  Does not radiate   Severity:  Severe   Onset quality:  Gradual   Duration:  4 days   Timing:  Constant   Progression:  Worsening Chronicity:  New Prior injury to area:  No Relieved by:  Nothing Worsened by:  Bearing weight Ineffective treatments: on amoxicillin since  tuesday. Associated symptoms comment:  Chills, swelling, redness, drainage Risk factors comment:  Hx of MRSA in the past and HTN      Past Medical History:  Diagnosis Date  . Basal cell carcinoma   . Hypertension     Patient Active Problem List   Diagnosis Date Noted  . Chest pain 11/08/2018  . Accelerated hypertension 11/08/2018  . Encounter to establish care 07/09/2017  . Essential hypertension 08/16/2015  . Sensorineural hearing loss (SNHL), bilateral 08/16/2015  . Impaired fasting glucose 08/23/2014  . Obesity (BMI 30-39.9) 08/23/2014  . History of nonmelanoma skin cancer 07/15/2012  . Allergic rhinitis 10/24/2011  . Basal cell carcinoma of back 07/10/2011    History reviewed. No pertinent surgical history.     No family history on file.  Social History   Tobacco Use  . Smoking status: Former Research scientist (life sciences)  . Smokeless tobacco: Never Used  Vaping Use  . Vaping Use: Never used  Substance Use Topics  . Alcohol use: Not Currently  . Drug use: Never    Home Medications Prior to Admission medications   Medication Sig Start Date End Date Taking? Authorizing Provider  amoxicillin-clavulanate (AUGMENTIN) 875-125 MG tablet Take 1 tablet by mouth 2 (two) times daily. 10/03/20   [provider]  Ascorbic Acid (VITAMIN C) 500 MG CHEW  Chew 1 tablet by mouth daily.    [provider]  aspirin 81 MG chewable tablet Chew 1 tablet (81 mg total) by mouth daily. 11/10/18   Eugenie Filler, MD  B Complex Vitamins (B-COMPLEX/B-12) TABS Take 1 tablet by mouth daily.    [provider]  losartan (COZAAR) 100 MG tablet TAKE 1 TABLET BY MOUTH EVERY DAY 07/12/20   Revankar, Reita Cliche, MD  Omega-3 Fatty Acids (FISH OIL) 1000 MG CAPS Take 1 capsule by mouth daily.    [provider]    Allergies    Niacin and related  Review of Systems   Review of Systems  All other systems reviewed and are negative.   Physical Exam Updated Vital Signs BP 132/68  (BP Location: Right Arm)   Pulse 89   Temp 100.3 F (37.9 C) (Oral)   Resp (!) 28   Ht 5\' 11"  (1.803 m)   Wt 107 kg   SpO2 100%   BMI 32.92 kg/m   Physical Exam Vitals and nursing note reviewed.  Constitutional:      General: He is not in acute distress.    Appearance: He is well-developed and well-nourished.  HENT:     Head: Normocephalic and atraumatic.     Mouth/Throat:     Mouth: Oropharynx is clear and moist. Mucous membranes are moist.  Eyes:     Extraocular Movements: EOM normal.     Conjunctiva/sclera: Conjunctivae normal.     Pupils: Pupils are equal, round, and reactive to light.  Cardiovascular:     Rate and Rhythm: Normal rate and regular rhythm.     Pulses: Intact distal pulses.     Heart sounds: No murmur heard.   Pulmonary:     Effort: Pulmonary effort is normal. No respiratory distress.     Breath sounds: Normal breath sounds. No wheezing or rales.  Abdominal:     General: There is no distension.     Palpations: Abdomen is soft.     Tenderness: There is no abdominal tenderness. There is no guarding or rebound.  Musculoskeletal:        General: Swelling and tenderness present. Normal range of motion.     Cervical back: Normal range of motion and neck supple.     Right lower leg: Edema present.       Legs:  Skin:    General: Skin is warm and dry.     Findings: No erythema or rash.  Neurological:     General: No focal deficit present.     Mental Status: He is alert and oriented to person, place, and time. Mental status is at baseline.  Psychiatric:        Mood and Affect: Mood and affect and mood normal.        Behavior: Behavior normal.        Thought Content: Thought content normal.           ED Results / Procedures / Treatments   Labs (all labs ordered are listed, but only abnormal results are displayed) Labs Reviewed  SARS CORONAVIRUS 2 BY RT PCR (HOSPITAL ORDER, [facility]) - Abnormal; Notable for the  following components:      Result Value   SARS Coronavirus 2 POSITIVE (*)    All other components within normal limits  COMPREHENSIVE METABOLIC PANEL - Abnormal; Notable for the following components:   Sodium 123 (*)    Chloride 90 (*)    CO2  21 (*)    Glucose, Bld 113 (*)    Calcium 8.0 (*)    Albumin 3.2 (*)    All other components within normal limits  CBC WITH DIFFERENTIAL/PLATELET - Abnormal; Notable for the following components:   WBC 10.8 (*)    RBC 4.11 (*)    Hemoglobin 12.0 (*)    HCT 35.0 (*)    Neutro Abs 9.2 (*)    All other components within normal limits  CULTURE, BLOOD (ROUTINE X 2)  CULTURE, BLOOD (ROUTINE X 2)  LACTIC ACID, PLASMA  PROTIME-INR  APTT  LACTIC ACID, PLASMA    EKG None  Radiology US Venous Img Lower Right (DVT Study)  Result Date: 10/05/2020 CLINICAL DATA:  Edema, pain and erythema right lower extremity. EXAM: RIGHT LOWER EXTREMITY VENOUS DOPPLER ULTRASOUND TECHNIQUE: Gray-scale sonography with graded compression, as well as color Doppler and duplex ultrasound were performed to evaluate the lower extremity deep venous systems from the level of the common femoral vein and including the common femoral, femoral, profunda femoral, popliteal and calf veins including the posterior tibial, peroneal and gastrocnemius veins when visible. The superficial great saphenous vein was also interrogated. Spectral Doppler was utilized to evaluate flow at rest and with distal augmentation maneuvers in the common femoral, femoral and popliteal veins. COMPARISON:  None. FINDINGS: Contralateral Common Femoral Vein: Respiratory phasicity is normal and symmetric with the symptomatic side. No evidence of thrombus. Normal compressibility. Common Femoral Vein: No evidence of thrombus. Normal compressibility, respiratory phasicity and response to augmentation. Saphenofemoral Junction: No evidence of thrombus. Normal compressibility and flow on color Doppler imaging. Profunda  Femoral Vein: No evidence of thrombus. Normal compressibility and flow on color Doppler imaging. Femoral Vein: No evidence of thrombus. Normal compressibility, respiratory phasicity and response to augmentation. Popliteal Vein: No evidence of thrombus. Normal compressibility, respiratory phasicity and response to augmentation. Calf Veins: No evidence of thrombus. Normal compressibility and flow on color Doppler imaging. Superficial Great Saphenous Vein: No evidence of thrombus. Normal compressibility. Venous Reflux:  None. Other Findings: No evidence of superficial thrombophlebitis or abnormal fluid collection. IMPRESSION: No evidence of right lower extremity deep venous thrombosis. Electronically Signed   By: Aletta Edouard M.D.   On: 10/05/2020 15:26    Procedures Procedures   Medications Ordered in ED Medications  lactated ringers infusion (has no administration in time range)  lactated ringers bolus 1,000 mL (has no administration in time range)  ceFEPIme (MAXIPIME) 2 g in sodium chloride 0.9 % 100 mL IVPB (has no administration in time range)  metroNIDAZOLE (FLAGYL) IVPB 500 mg (has no administration in time range)  vancomycin (VANCOCIN) IVPB 1000 mg/200 mL premix (has no administration in time range)  acetaminophen (TYLENOL) tablet 1,000 mg (has no administration in time range)  morphine 4 MG/ML injection 4 mg (has no administration in time range)  ondansetron (ZOFRAN) injection 4 mg (has no administration in time range)    ED Course  I have reviewed the triage vital signs and the nursing notes.  Pertinent labs & imaging results that were available during my care of the patient were reviewed by me and considered in my medical decision making (see chart for details).    MDM Rules/Calculators/A&P                          Patient presenting today with concerns for cellulitis and code sepsis.  He has significant cellulitis to the right lower extremity without definitive abscess.  He has  significant swelling and erythema above the knee but does not involve the ankle.  No groin involvement.  Lower suspicion for necrotizing fasciitis as patient symptoms have now been present for approximately 8 days.  There is no crepitus noted however we will do an ultrasound to rule out DVT.  Code sepsis order set initiated.  Vital signs at this time are normal.  Patient is having chills and was given Tylenol as he does have a temperature of 100.3.  He was started on antibiotics based on nonpurulent cellulitis.  Patient will need admission for IV antibiotics as he has failed outpatient treatment.  3:47 PM Patient's lactic acid today is within normal limit and he will continue on a rate of LR after receiving his 1 L bolus.  CMP with hyponatremia of 123 with normal renal function and blood sugar of 113.  No anion gap at this time.  Mild leukocytosis of 10.8 with stable hemoglobin of 12.  Patient is Covid positive today and he reports that now that he thinks about it he has had a mild cough and some congestion this week but no other significant symptoms.  Ultrasound is negative for DVT and patient has received his antibiotics.  Temperature peaked at 102.4 and he has received Tylenol.  On repeat evaluation patient remained stable.  We will plan on admitting for ongoing treatment for cellulitis.  MDM Number of Diagnoses or Management Options   Amount and/or Complexity of Data Reviewed Clinical lab tests: ordered and reviewed Tests in the radiology section of CPT: ordered and reviewed Tests in the medicine section of CPT: ordered and reviewed Decide to obtain previous medical records or to obtain history from someone other than the patient: yes Obtain history from someone other than the patient: yes Review and summarize past medical records: yes Discuss the patient with other providers: yes Independent visualization of images, tracings, or specimens: yes  Risk of Complications, Morbidity, and/or  Mortality Presenting problems: high Diagnostic procedures: low Management options: moderate  Patient Progress Patient progress: stable  Andres [name] was evaluated in Emergency Department on 10/05/2020 for the symptoms described in the history of present illness. He was evaluated in the context of the global COVID-19 pandemic, which necessitated consideration that the patient might be at risk for infection with the SARS-CoV-2 virus that causes COVID-19. Institutional protocols and algorithms that pertain to the evaluation of patients at risk for COVID-19 are in a state of rapid change based on information released by regulatory bodies including the CDC and federal and state organizations. These policies and algorithms were followed during the patient's care in the ED.  Final Clinical Impression(s) / ED Diagnoses Final diagnoses:  Cellulitis of right lower extremity  COVID    Rx / DC Orders ED Discharge Orders    None       Blanchie Dessert, MD 10/05/20 1550

## 2020-10-05 NOTE — H&P (Signed)
History and Physical    Andres [name] [id number] DOB: Oct 24, 1955 DOA: 10/05/2020  PCP: Mackie Pai, PA-C  Patient coming from: Home.  Chief Complaint: Right lower extremity swelling and pain.  HPI: Andres [name] is a 65 y.o. male with history of hypertension had a small skin injury on the right lower extremity while at work about a week ago.  About 2 days later patient's leg started having increasing swelling and redness and had taken oral antibiotics despite which the swelling was worsening and was instructed to come to the ER.  Last couple days patient has been having upper respiratory tract symptoms.  ED Course: In the ER x-rays do not show any acute findings of the bone.  Dopplers were negative for DVT.  Given the worsening erythema swelling patient was admitted for further management for cellulitis.  Covid test incidentally was positive but patient is not hypoxic and chest x-ray does not show any infiltrates.  Does have symptoms of upper respiratory.  Patient was started on remdesivir.  For cellulitis patient on empiric antibiotics after blood cultures obtained and started on fluids.  Labs are significant for CRP of 26.7 sodium 123 creatinine 1.2 and hemoglobin of 12.  Review of Systems: As per HPI, rest all negative.   Past Medical History:  Diagnosis Date  . Basal cell carcinoma   . Hypertension     History reviewed. No pertinent surgical history.   reports that he has quit smoking. He has never used smokeless tobacco. He reports previous alcohol use. He reports that he does not use drugs.  Allergies  Allergen Reactions  . Niacin And Related Shortness Of Breath    Family History  Family history unknown: Yes    Prior to Admission medications   Medication Sig Start Date End Date Taking? Authorizing Provider  amoxicillin-clavulanate (AUGMENTIN) 875-125 MG tablet Take 1 tablet by mouth 2 (two) times daily. 10/03/20   [provider]  Ascorbic Acid  (VITAMIN C) 500 MG CHEW Chew 1 tablet by mouth daily.    [provider]  aspirin 81 MG chewable tablet Chew 1 tablet (81 mg total) by mouth daily. 11/10/18   Eugenie Filler, MD  B Complex Vitamins (B-COMPLEX/B-12) TABS Take 1 tablet by mouth daily.    [provider]  losartan (COZAAR) 100 MG tablet TAKE 1 TABLET BY MOUTH EVERY DAY 07/12/20   Revankar, Reita Cliche, MD  Omega-3 Fatty Acids (FISH OIL) 1000 MG CAPS Take 1 capsule by mouth daily.    [provider]    Physical Exam: Constitutional: Moderately built and nourished. Vitals:   10/05/20 1630 10/05/20 1700 10/05/20 1811 10/05/20 1930  BP: 140/69 136/73 (!) 164/80 133/69  Pulse: 96 (!) 103 (!) 109 (!) 101  Resp: (!) 27 (!) 23 (!) 22 20  Temp:   (!) 102.9 F (39.4 C) (!) 100.6 F (38.1 C)  TempSrc:   Oral Oral  SpO2: 95% 96% 98% 97%  Weight:    116.9 kg  Height:    5\' 11"  (1.803 m)   Eyes: Anicteric no pallor. ENMT: No discharge from the ears eyes nose or mouth. Neck: No mass felt.  No neck rigidity. Respiratory: No rhonchi or crepitations. Cardiovascular: S1-S2 heard. Abdomen: Soft nontender bowel sounds present. Musculoskeletal: Right lower extremity swollen up to the knees. Skin: Erythema of the right lower extremity. Neurologic: Alert awake oriented to time place and person.  Moves all extremities. Psychiatric: Appears normal.  Normal affect.   Labs on Admission:  I have personally reviewed following labs and imaging studies  CBC: Recent Labs  Lab 10/05/20 1249  WBC 10.8*  NEUTROABS 9.2*  HGB 12.0*  HCT 35.0*  MCV 85.2  PLT 123456   Basic Metabolic Panel: Recent Labs  Lab 10/05/20 1249  NA 123*  K 3.7  CL 90*  CO2 21*  GLUCOSE 113*  BUN 15  CREATININE 1.20  CALCIUM 8.0*   GFR: Estimated Creatinine Clearance: 83 mL/min (by C-G formula based on SCr of 1.2 mg/dL). Liver Function Tests: Recent Labs  Lab 10/05/20 1249  AST 28  ALT 35  ALKPHOS 51  BILITOT 0.4  PROT 7.4   ALBUMIN 3.2*   No results for input(s): LIPASE, AMYLASE in the last 168 hours. No results for input(s): AMMONIA in the last 168 hours. Coagulation Profile: Recent Labs  Lab 10/05/20 1249  INR 1.1   Cardiac Enzymes: No results for input(s): CKTOTAL, CKMB, CKMBINDEX, TROPONINI in the last 168 hours. BNP (last 3 results) No results for input(s): PROBNP in the last 8760 hours. HbA1C: No results for input(s): HGBA1C in the last 72 hours. CBG: No results for input(s): GLUCAP in the last 168 hours. Lipid Profile: No results for input(s): CHOL, HDL, LDLCALC, TRIG, CHOLHDL, LDLDIRECT in the last 72 hours. Thyroid Function Tests: No results for input(s): TSH, T4TOTAL, FREET4, T3FREE, THYROIDAB in the last 72 hours. Anemia Panel: No results for input(s): VITAMINB12, FOLATE, FERRITIN, TIBC, IRON, RETICCTPCT in the last 72 hours. Urine analysis:    Component Value Date/Time   COLORURINE YELLOW 10/05/2020 1800   APPEARANCEUR CLEAR 10/05/2020 1800   LABSPEC 1.010 10/05/2020 1800   PHURINE 6.0 10/05/2020 1800   GLUCOSEU NEGATIVE 10/05/2020 1800   HGBUR NEGATIVE 10/05/2020 1800   BILIRUBINUR NEGATIVE 10/05/2020 1800   KETONESUR NEGATIVE 10/05/2020 1800   PROTEINUR 30 (A) 10/05/2020 1800   NITRITE NEGATIVE 10/05/2020 1800   LEUKOCYTESUR NEGATIVE 10/05/2020 1800   Sepsis Labs: @LABRCNTIP (procalcitonin:4,lacticidven:4) ) Recent Results (from the past 240 hour(s))  SARS Coronavirus 2 by RT PCR (hospital order, performed in [facility] hospital lab) Nasopharyngeal Peripheral     Status: Abnormal   Collection Time: 10/05/20  1:09 PM   Specimen: Peripheral; Nasopharyngeal  Result Value Ref Range Status   SARS Coronavirus 2 POSITIVE (A) NEGATIVE Final    Comment: RESULT CALLED TO, READ BACK BY AND VERIFIED WITH: [place]. C, RN @ [id number] ON 10/05/2020, [email or web address] (NOTE) SARS-CoV-2 target nucleic acids are DETECTED  SARS-CoV-2 RNA is generally detectable in upper respiratory specimens   during the acute phase of infection.  Positive results are indicative  of the presence of the identified virus, but do not rule out bacterial infection or co-infection with other pathogens not detected by the test.  Clinical correlation with patient history and  other diagnostic information is necessary to determine patient infection status.  The expected result is negative.  Fact Sheet for Patients:   StrictlyIdeas.no   Fact Sheet for Healthcare Providers:   BankingDealers.co.za    This test is not yet approved or cleared by the [place] FDA and  has been authorized for detection and/or diagnosis of SARS-CoV-2 by FDA under an Emergency Use Authorization (EUA).  This EUA will remain in effec t (meaning this test can be used) for the duration of  the COVID-19 declaration under Section 564(b)(1) of the Act, 21 U.S.C. section 360-bbb-3(b)(1), unless the authorization is terminated or revoked sooner.  Performed at [facility], [address] [place], [place] [address]  Blood Culture (routine x 2)     Status: None (Preliminary result)   Collection Time: 10/05/20  1:09 PM   Specimen: BLOOD  Result Value Ref Range Status   Specimen Description   Final    BLOOD LEFT ANTECUBITAL Performed at [facility], [place] [address], [place],  [address]    Special Requests   Final    BOTTLES DRAWN AEROBIC AND ANAEROBIC Blood Culture results may not be optimal due to an inadequate volume of blood received in culture bottles Performed at [facility], [address] [place], [place] [address]    Culture PENDING  Incomplete   Report Status PENDING  Incomplete     Radiological Exams on Admission: DG Tibia/Fibula Right  Result Date: 10/05/2020 CLINICAL DATA:  Wound.  Cellulitis possible sepsis EXAM: RIGHT TIBIA AND FIBULA - 2 VIEW COMPARISON:  None. FINDINGS: There is no evidence of fracture or other focal bone  lesions. Soft tissues are unremarkable. IMPRESSION: Negative. Electronically Signed   By: Franchot Gallo M.D.   On: 10/05/2020 16:30   US Venous Img Lower Right (DVT Study)  Result Date: 10/05/2020 CLINICAL DATA:  Edema, pain and erythema right lower extremity. EXAM: RIGHT LOWER EXTREMITY VENOUS DOPPLER ULTRASOUND TECHNIQUE: Gray-scale sonography with graded compression, as well as color Doppler and duplex ultrasound were performed to evaluate the lower extremity deep venous systems from the level of the common femoral vein and including the common femoral, femoral, profunda femoral, popliteal and calf veins including the posterior tibial, peroneal and gastrocnemius veins when visible. The superficial great saphenous vein was also interrogated. Spectral Doppler was utilized to evaluate flow at rest and with distal augmentation maneuvers in the common femoral, femoral and popliteal veins. COMPARISON:  None. FINDINGS: Contralateral Common Femoral Vein: Respiratory phasicity is normal and symmetric with the symptomatic side. No evidence of thrombus. Normal compressibility. Common Femoral Vein: No evidence of thrombus. Normal compressibility, respiratory phasicity and response to augmentation. Saphenofemoral Junction: No evidence of thrombus. Normal compressibility and flow on color Doppler imaging. Profunda Femoral Vein: No evidence of thrombus. Normal compressibility and flow on color Doppler imaging. Femoral Vein: No evidence of thrombus. Normal compressibility, respiratory phasicity and response to augmentation. Popliteal Vein: No evidence of thrombus. Normal compressibility, respiratory phasicity and response to augmentation. Calf Veins: No evidence of thrombus. Normal compressibility and flow on color Doppler imaging. Superficial Great Saphenous Vein: No evidence of thrombus. Normal compressibility. Venous Reflux:  None. Other Findings: No evidence of superficial thrombophlebitis or abnormal fluid collection.  IMPRESSION: No evidence of right lower extremity deep venous thrombosis. Electronically Signed   By: Aletta Edouard M.D.   On: 10/05/2020 15:26   DG Chest Port 1 View  Result Date: 10/05/2020 CLINICAL DATA:  COVID positive.  Sepsis. EXAM: PORTABLE CHEST 1 VIEW COMPARISON:  11/08/2018 FINDINGS: Cardiac enlargement without heart failure. Lungs are well aerated and clear. No infiltrate or effusion. 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 IMPRESSION: No active disease. Electronically Signed   By: Franchot Gallo M.D.   On: 10/05/2020 17:50    EKG: Independently reviewed.  Normal sinus rhythm.  Assessment/Plan Principal Problem:   Cellulitis of right lower extremity Active Problems:   Essential hypertension   Cellulitis   Hyponatremia   COVID-19 virus infection    1. Cellulitis of the right lower extremity for which patient has been placed on empiric antibiotics hydration and I have ordered MRI of the right lower extremity.  No definite  signs of any compartment syndrome at this time.  Tetanus toxoid booster has been ordered after patient consenting. 2. COVID-19 infection positive presently not hypoxic.  Does have some upper respiratory tract symptoms.  On IV remdesivir for now.  Follow respiratory status closely. 3. Hyponatremia cause not clear.  Check urine studies follow metabolic panel closely. 4. Hypertension on Cozaar.  Follow metabolic panel closely.  Any further worsening of creatinine will hold off Cozaar.  Since patient has cellulitis of the right lower extremity with COVID infection will need close monitoring for any further worsening in inpatient status.   DVT prophylaxis: Lovenox. Code Status: Full code. Family Communication: Discussed with patient. Disposition Plan: Home. Consults called: None. Admission status: Inpatient.   Rise Patience MD [facility] Pager [phone].  If 7PM-7AM, please contact night-coverage [email or web address] Password  [email or web address]  10/05/2020, 8:10 PM

## 2020-10-05 NOTE — Plan of Care (Signed)
  Problem: Education: Goal: Knowledge of General Education information will improve Description: Including pain rating scale, medication(s)/side effects and non-pharmacologic comfort measures 10/05/2020 2319 by Micheline Chapman, RN Outcome: Progressing 10/05/2020 1958 by Micheline Chapman, RN Outcome: Progressing   Problem: Health Behavior/Discharge Planning: Goal: Ability to manage health-related needs will improve 10/05/2020 2319 by Micheline Chapman, RN Outcome: Progressing 10/05/2020 1958 by Micheline Chapman, RN Outcome: Progressing   Problem: Clinical Measurements: Goal: Ability to maintain clinical measurements within normal limits will improve 10/05/2020 2319 by Micheline Chapman, RN Outcome: Progressing 10/05/2020 1958 by Micheline Chapman, RN Outcome: Progressing Goal: Will remain free from infection 10/05/2020 2319 by Micheline Chapman, RN Outcome: Progressing 10/05/2020 1958 by Micheline Chapman, RN Outcome: Progressing Goal: Diagnostic test results will improve 10/05/2020 2319 by Micheline Chapman, RN Outcome: Progressing 10/05/2020 1958 by Micheline Chapman, RN Outcome: Progressing Goal: Respiratory complications will improve 10/05/2020 2319 by Micheline Chapman, RN Outcome: Progressing 10/05/2020 1958 by Micheline Chapman, RN Outcome: Progressing Goal: Cardiovascular complication will be avoided 10/05/2020 2319 by Micheline Chapman, RN Outcome: Progressing 10/05/2020 1958 by Micheline Chapman, RN Outcome: Progressing   Problem: Activity: Goal: Risk for activity intolerance will decrease 10/05/2020 2319 by Micheline Chapman, RN Outcome: Progressing 10/05/2020 1958 by Micheline Chapman, RN Outcome: Progressing   Problem: Nutrition: Goal: Adequate nutrition will be maintained 10/05/2020 2319 by Micheline Chapman, RN Outcome: Progressing 10/05/2020 1958 by Micheline Chapman, RN Outcome: Progressing   Problem: Coping: Goal: Level of anxiety will decrease 10/05/2020 2319 by Micheline Chapman, RN Outcome:  Progressing 10/05/2020 1958 by Micheline Chapman, RN Outcome: Progressing   Problem: Elimination: Goal: Will not experience complications related to bowel motility 10/05/2020 2319 by Micheline Chapman, RN Outcome: Progressing 10/05/2020 1958 by Micheline Chapman, RN Outcome: Progressing Goal: Will not experience complications related to urinary retention 10/05/2020 2319 by Micheline Chapman, RN Outcome: Progressing 10/05/2020 1958 by Micheline Chapman, RN Outcome: Progressing   Problem: Pain Managment: Goal: General experience of comfort will improve 10/05/2020 2319 by Micheline Chapman, RN Outcome: Progressing 10/05/2020 1958 by Micheline Chapman, RN Outcome: Progressing   Problem: Safety: Goal: Ability to remain free from injury will improve 10/05/2020 2319 by Micheline Chapman, RN Outcome: Progressing 10/05/2020 1958 by Micheline Chapman, RN Outcome: Progressing   Problem: Skin Integrity: Goal: Risk for impaired skin integrity will decrease 10/05/2020 2319 by Micheline Chapman, RN Outcome: Progressing 10/05/2020 1958 by Micheline Chapman, RN Outcome: Progressing   Problem: Education: Goal: Knowledge of risk factors and measures for prevention of condition will improve 10/05/2020 2319 by Micheline Chapman, RN Outcome: Progressing 10/05/2020 1958 by Micheline Chapman, RN Outcome: Progressing   Problem: Coping: Goal: Psychosocial and spiritual needs will be supported 10/05/2020 2319 by Micheline Chapman, RN Outcome: Progressing 10/05/2020 1958 by Micheline Chapman, RN Outcome: Progressing   Problem: Respiratory: Goal: Will maintain a patent airway 10/05/2020 2319 by Micheline Chapman, RN Outcome: Progressing 10/05/2020 1958 by Micheline Chapman, RN Outcome: Progressing Goal: Complications related to the disease process, condition or treatment will be avoided or minimized 10/05/2020 2319 by Micheline Chapman, RN Outcome: Progressing 10/05/2020 1958 by Micheline Chapman, RN Outcome: Progressing

## 2020-10-05 NOTE — ED Triage Notes (Signed)
Had accident last wed  [place] and now his rt leg is starting to get infected , was placed on antiobiotics  Now it is worse. Rt leg extremely swollen tender  Cannot walk

## 2020-10-06 ENCOUNTER — Inpatient Hospital Stay (HOSPITAL_COMMUNITY): Payer: Worker's Compensation

## 2020-10-06 DIAGNOSIS — L03115 Cellulitis of right lower limb: Secondary | ICD-10-CM | POA: Diagnosis not present

## 2020-10-06 LAB — CBC WITH DIFFERENTIAL/PLATELET
Abs Immature Granulocytes: 0.38 10*3/uL — ABNORMAL HIGH (ref 0.00–0.07)
Basophils Absolute: 0 10*3/uL (ref 0.0–0.1)
Basophils Relative: 0 %
Eosinophils Absolute: 0 10*3/uL (ref 0.0–0.5)
Eosinophils Relative: 0 %
HCT: 34.7 % — ABNORMAL LOW (ref 39.0–52.0)
Hemoglobin: 11.7 g/dL — ABNORMAL LOW (ref 13.0–17.0)
Immature Granulocytes: 4 %
Lymphocytes Relative: 15 %
Lymphs Abs: 1.5 10*3/uL (ref 0.7–4.0)
MCH: 29.5 pg (ref 26.0–34.0)
MCHC: 33.7 g/dL (ref 30.0–36.0)
MCV: 87.6 fL (ref 80.0–100.0)
Monocytes Absolute: 0.8 10*3/uL (ref 0.1–1.0)
Monocytes Relative: 8 %
Neutro Abs: 7.3 10*3/uL (ref 1.7–7.7)
Neutrophils Relative %: 73 %
Platelets: 179 10*3/uL (ref 150–400)
RBC: 3.96 MIL/uL — ABNORMAL LOW (ref 4.22–5.81)
RDW: 14.2 % (ref 11.5–15.5)
WBC: 9.9 10*3/uL (ref 4.0–10.5)
nRBC: 0 % (ref 0.0–0.2)

## 2020-10-06 LAB — D-DIMER, QUANTITATIVE: D-Dimer, Quant: 0.99 ug/mL-FEU — ABNORMAL HIGH (ref 0.00–0.50)

## 2020-10-06 LAB — COMPREHENSIVE METABOLIC PANEL
ALT: 27 U/L (ref 0–44)
AST: 19 U/L (ref 15–41)
Albumin: 2.6 g/dL — ABNORMAL LOW (ref 3.5–5.0)
Alkaline Phosphatase: 45 U/L (ref 38–126)
Anion gap: 9 (ref 5–15)
BUN: 14 mg/dL (ref 8–23)
CO2: 23 mmol/L (ref 22–32)
Calcium: 7.5 mg/dL — ABNORMAL LOW (ref 8.9–10.3)
Chloride: 93 mmol/L — ABNORMAL LOW (ref 98–111)
Creatinine, Ser: 1.51 mg/dL — ABNORMAL HIGH (ref 0.61–1.24)
GFR, Estimated: 52 mL/min — ABNORMAL LOW (ref >60)
Glucose, Bld: 132 mg/dL — ABNORMAL HIGH (ref 70–99)
Potassium: 3.4 mmol/L — ABNORMAL LOW (ref 3.5–5.1)
Sodium: 125 mmol/L — ABNORMAL LOW (ref 135–145)
Total Bilirubin: 0.7 mg/dL (ref 0.3–1.2)
Total Protein: 6.2 g/dL — ABNORMAL LOW (ref 6.5–8.1)

## 2020-10-06 LAB — MRSA PCR SCREENING: MRSA by PCR: NEGATIVE

## 2020-10-06 LAB — HIV ANTIBODY (ROUTINE TESTING W REFLEX): HIV Screen 4th Generation wRfx: NONREACTIVE

## 2020-10-06 LAB — C-REACTIVE PROTEIN: CRP: 26.7 mg/dL — ABNORMAL HIGH (ref <1.0)

## 2020-10-06 LAB — TSH: TSH: 0.741 u[IU]/mL (ref 0.350–4.500)

## 2020-10-06 MED ORDER — HYDROMORPHONE HCL 1 MG/ML IJ SOLN
1.0000 mg | INTRAMUSCULAR | Status: DC | PRN
  Administered 2020-10-06 – 2020-10-15 (×28): 1 mg via INTRAVENOUS
  Filled 2020-10-06 (×27): qty 1

## 2020-10-06 MED ORDER — LACTATED RINGERS IV BOLUS
1000.0000 mL | Freq: Once | INTRAVENOUS | Status: AC
  Administered 2020-10-06: 1000 mL via INTRAVENOUS

## 2020-10-06 MED ORDER — SODIUM CHLORIDE 0.9 % IV SOLN
700.0000 mg | Freq: Every day | INTRAVENOUS | Status: AC
  Administered 2020-10-06 – 2020-10-18 (×13): 700 mg via INTRAVENOUS
  Filled 2020-10-06 (×15): qty 14

## 2020-10-06 MED ORDER — HYDROCODONE-ACETAMINOPHEN 5-325 MG PO TABS
1.0000 | ORAL_TABLET | Freq: Four times a day (QID) | ORAL | Status: DC | PRN
Start: 2020-10-06 — End: 2020-10-15
  Administered 2020-10-06 – 2020-10-15 (×15): 1 via ORAL
  Filled 2020-10-06 (×15): qty 1

## 2020-10-06 MED ORDER — HYDRALAZINE HCL 20 MG/ML IJ SOLN: 10.0000 mg | INTRAMUSCULAR | Status: DC | PRN

## 2020-10-06 MED ORDER — SODIUM CHLORIDE 0.9 % IV SOLN
2.0000 g | INTRAVENOUS | Status: AC
  Administered 2020-10-06 – 2020-10-18 (×13): 2 g via INTRAVENOUS
  Filled 2020-10-06 (×6): qty 20
  Filled 2020-10-06: qty 2
  Filled 2020-10-06 (×2): qty 20
  Filled 2020-10-06: qty 2
  Filled 2020-10-06 (×4): qty 20

## 2020-10-06 MED ORDER — ONDANSETRON HCL 4 MG/2ML IJ SOLN
4.0000 mg | Freq: Four times a day (QID) | INTRAMUSCULAR | Status: DC | PRN
  Administered 2020-10-06 – 2020-10-15 (×2): 4 mg via INTRAVENOUS
  Filled 2020-10-06: qty 2

## 2020-10-06 MED ORDER — ONDANSETRON HCL 4 MG/2ML IJ SOLN
INTRAMUSCULAR | Status: AC
  Filled 2020-10-06: qty 2

## 2020-10-06 MED ORDER — ONDANSETRON 4 MG PO TBDP: 4.0000 mg | ORAL_TABLET | Freq: Three times a day (TID) | ORAL | Status: DC | PRN

## 2020-10-06 NOTE — Discharge Instructions (Signed)
10 Things You Can Do to Manage Your COVID-19 Symptoms at Home If you have possible or confirmed COVID-19: 1. Stay home except to get medical care. 2. Monitor your symptoms carefully. If your symptoms get worse, call your healthcare provider immediately. 3. Get rest and stay hydrated. 4. If you have a medical appointment, call the healthcare provider ahead of time and tell them that you have or may have COVID-19. 5. For medical emergencies, call 911 and notify the dispatch personnel that you have or may have COVID-19. 6. Cover your cough and sneezes with a tissue or use the inside of your elbow. 7. Wash your hands often with soap and water for at least 20 seconds or clean your hands with an alcohol-based hand sanitizer that contains at least 60% alcohol. 8. As much as possible, stay in a specific room and away from other people in your home. Also, you should use a separate bathroom, if available. If you need to be around other people in or outside of the home, wear a mask. 9. Avoid sharing personal items with other people in your household, like dishes, towels, and bedding. 10. Clean all surfaces that are touched often, like counters, tabletops, and doorknobs. Use household cleaning sprays or wipes according to the label instructions. cdc.gov/coronavirus 03/17/2020 This information is not intended to replace advice given to you by your health care provider. Make sure you discuss any questions you have with your health care provider. Document Revised: 07/03/2020 Document Reviewed: 07/03/2020 Elsevier Patient Education  2021 Elsevier Inc.  COVID-19 Quarantine vs. Isolation QUARANTINE keeps someone who was in close contact with someone who has COVID-19 away from others. Quarantine if you have been in close contact with someone who has COVID-19, unless you have been fully vaccinated. If you are fully vaccinated  You do NOT need to quarantine unless they have symptoms  Get tested 3-5 days after  your exposure, even if you don't have symptoms  Wear a mask indoors in public for 14 days following exposure or until your test result is negative If you are not fully vaccinated  Stay home for 14 days after your last contact with a person who has COVID-19  Watch for fever (100.4F), cough, shortness of breath, or other symptoms of COVID-19  If possible, stay away from people you live with, especially people who are at higher risk for getting very sick from COVID-19  Contact your local public health department for options in your area to possibly shorten your quarantine ISOLATION keeps someone who is sick or tested positive for COVID-19 without symptoms away from others, even in their own home. People who are in isolation should stay home and stay in a specific "sick room" or area and use a separate bathroom (if available). If you are sick and think or know you have COVID-19 Stay home until after  At least 10 days since symptoms first appeared and  At least 24 hours with no fever without the use of fever-reducing medications and  Symptoms have improved If you tested positive for COVID-19 but do not have symptoms  Stay home until after 10 days have passed since your positive viral test  If you develop symptoms after testing positive, follow the steps above for those who are sick cdc.gov/coronavirus 05/29/2020 This information is not intended to replace advice given to you by your health care provider. Make sure you discuss any questions you have with your health care provider. Document Revised: 07/03/2020 Document Reviewed: 07/03/2020 Elsevier Patient Education    [place].

## 2020-10-06 NOTE — Progress Notes (Addendum)
PROGRESS NOTE    Patient: Andres [name]                            PCP: Mackie Pai, PA-C                    DOB: Dec 04, 1955            DOA: 10/05/2020 [id number]             DOS: 10/06/2020, 8:30 AM   LOS: 1 day   Date of Service: The patient was seen and examined on 10/06/2020  Subjective:   The patient was seen and examined this morning. Stable at this time. Still complaining of: Mild shortness of breath, cough, lower extremity pain Otherwise no issues overnight .  Brief Narrative:   Redd Rende is a 65 y.o. male with history of hypertension had a small skin injury on the right lower extremity while at work about a week ago.  About 2 days later patient's leg started having increasing swelling and redness and had taken oral antibiotics despite which the swelling was worsening and was instructed to come to the ER.   Last couple days patient has been having upper respiratory tract symptoms. He is not vaccinated for Covid -SARS-CoV-2 positive Also admitted for right lower extremity cellulitis.     Assessment & Plan:   Principal Problem:   Cellulitis of right lower extremity Active Problems:   Essential hypertension   Cellulitis   Hyponatremia   COVID-19 virus infection   Cellulitis of the right lower extremity  -Erythema edema tenderness anterior shin blisters with clear drainage noted in the right lower extremity below knee area does not involve the ankle or foot -Obtaining MRI of the right lower extremity -Continue current broad-spectrum antibiotics -Tetanus booster given in ED   Addendum MRI of right lower extremity: IMPRESSION: 1. Diffuse and fairly marked subcutaneous soft tissue swelling/edema/fluid consistent with severe cellulitis. No obvious discrete drainable soft tissue abscess. 2. Myositis mainly involving the anterior tibialis muscle but also the medial soleus muscle. 3. No definite findings for pyomyositis. 4. No findings for  osteomyelitis.     COVID-19 infection infection  -Patient remains on room air, not hypoxic -Mild to moderate respiratory distress-with cough mild shortness of breath -Initiated on IV remdesivir will continue for 3 days -We will holding steroids treatment at this time -Monitor his respiratory status closely  Hyponatremia  -Unclear etiology, ruling out SIADH,  -Follow labs including urine studies -Sodium 126, 125  Hypertension -Patient has been on Cozaar -we will hold for now as creatinine mildly elevated -As needed hydralazine      Skin Assessment: I have examined the patient's skin and I agree with the wound assessment as performed by wound care team: Visible erythema edema right lower extremity below the knee up to the ankle area anterior shin multiple blisters some open draining clear liquid    ------------------------------------------------------------------------------------------------------------------------------------ Cultures; Blood Cultures:  Antimicrobials: 10/05/2020 IV cefepime/vancomycin  Consultants: None   -----------------------------------------------------------------------------------------------------------------------------------  DVT prophylaxis:  Lovenox SQ Code Status:   Code Status: Full Code  Family Communication: No family member present at bedside- attempt will be made to update daily The above findings and plan of care has been discussed with patient (and family)  in detail,  they expressed understanding and agreement of above. -Advance care planning has been discussed.   Admission status:   Status is: Inpatient  Remains inpatient appropriate because:Inpatient level of  care appropriate due to severity of illness Since patient has cellulitis of the right lower extremity with COVID infection will need close monitoring for any further worsening in inpatient status.  Dispo: The patient is from: Home              Anticipated d/c is  to: Home              Anticipated d/c date is: 3 days              Patient currently is not medically stable to d/c.   Difficult to place patient No      Level of care: Med-Surg   Procedures:   No admission procedures for hospital encounter.     Antimicrobials:  Anti-infectives (From admission, onward)   Start     Dose/Rate Route Frequency Ordered Stop   10/06/20 1800  vancomycin (VANCOREADY) IVPB 1500 mg/300 mL        1,500 mg 150 mL/hr over 120 Minutes Intravenous Every 24 hours 10/05/20 1430     10/06/20 1000  remdesivir 100 mg in sodium chloride 0.9 % 100 mL IVPB  Status:  Discontinued        100 mg 200 mL/hr over 30 Minutes Intravenous Daily 10/05/20 1730 10/05/20 1932   10/06/20 1000  remdesivir 100 mg in sodium chloride 0.9 % 100 mL IVPB        100 mg 200 mL/hr over 30 Minutes Intravenous Daily 10/05/20 1932 10/08/20 0959   10/05/20 2200  ceFEPIme (MAXIPIME) 2 g in sodium chloride 0.9 % 100 mL IVPB        2 g 200 mL/hr over 30 Minutes Intravenous Every 8 hours 10/05/20 1430     10/05/20 1800  remdesivir 100 mg in sodium chloride 0.9 % 100 mL IVPB        100 mg 200 mL/hr over 30 Minutes Intravenous Every 30 min 10/05/20 1730 10/05/20 1904   10/05/20 1315  vancomycin (VANCOCIN) IVPB 1000 mg/200 mL premix       "And" Linked Group Details   1,000 mg 200 mL/hr over 60 Minutes Intravenous  Once 10/05/20 1300 10/05/20 1756   10/05/20 1315  vancomycin (VANCOCIN) IVPB 1000 mg/200 mL premix       "And" Linked Group Details   1,000 mg 200 mL/hr over 60 Minutes Intravenous  Once 10/05/20 1300 10/05/20 1707   10/05/20 1300  ceFEPIme (MAXIPIME) 2 g in sodium chloride 0.9 % 100 mL IVPB        2 g 200 mL/hr over 30 Minutes Intravenous  Once 10/05/20 1250 10/05/20 1427   10/05/20 1300  metroNIDAZOLE (FLAGYL) IVPB 500 mg        500 mg 100 mL/hr over 60 Minutes Intravenous  Once 10/05/20 1250 10/05/20 1542   10/05/20 1300  vancomycin (VANCOCIN) IVPB 1000 mg/200 mL premix   Status:  Discontinued        1,000 mg 200 mL/hr over 60 Minutes Intravenous  Once 10/05/20 1250 10/05/20 1300       Medication:  . vitamin C  500 mg Oral Daily  . aspirin  81 mg Oral Daily  . enoxaparin (LOVENOX) injection  60 mg Subcutaneous Q24H  . mouth rinse  15 mL Mouth Rinse BID  . zinc sulfate  220 mg Oral Daily    acetaminophen **OR** acetaminophen, hydrALAZINE   Objective:   Vitals:   10/05/20 1930 10/05/20 2349 10/06/20 0412 10/06/20 0805  BP: 133/69 108/63 101/75 128/75  Pulse: (!) 101  82 79 78  Resp: 20 20 20 18   Temp: (!) 100.6 F (38.1 C) 97.7 F (36.5 C) 97.6 F (36.4 C) 97.6 F (36.4 C)  TempSrc: Oral  Oral Oral  SpO2: 97% 98% 100% 100%  Weight: 116.9 kg     Height: 5\' 11"  (1.803 m)       Intake/Output Summary (Last 24 hours) at 10/06/2020 0830 Last data filed at 10/06/2020 [id number] Gross per 24 hour  Intake 4191.49 ml  Output -  Net 4191.49 ml   Filed Weights   10/05/20 1203 10/05/20 1430 10/05/20 1930  Weight: 107 kg 107 kg 116.9 kg     Examination:   Physical Exam  Constitution:  Alert, cooperative, no distress,  Appears calm and comfortable  Psychiatric: Normal and stable mood and affect, cognition intact,   HEENT: Normocephalic, PERRL, otherwise with in Normal limits  Chest:Chest symmetric Cardio vascular:  S1/S2, RRR, No murmure, No Rubs or Gallops  pulmonary: Diffuse rhonchi,, respirations unlabored, negative wheezes / crackles Abdomen: Soft, non-tender, non-distended, bowel sounds,no masses, no organomegaly Muscular skeletal: Limited exam - in bed, able to move all 4 extremities, Normal strength,  Neuro: CNII-XII intact. , normal motor and sensation, reflexes intact  Extremities: No pitting edema lower extremities, +2 pulses  Skin: Dry, warm to touch, negative for any Rashes, right lower extremity: Below the knee positive for erythema edema, open blisters with clear drainage Wounds: Right lower extremity erythema edema with blisters... No  other open wounds visible    ------------------------------------------------------------------------------------------------------------------------------------------    LABs:  CBC Latest Ref Rng & Units 10/06/2020 10/05/2020 10/05/2020  WBC 4.0 - 10.5 K/uL 9.9 9.4 10.8(H)  Hemoglobin 13.0 - 17.0 g/dL 11.7(L) 10.8(L) 12.0(L)  Hematocrit 39.0 - 52.0 % 34.7(L) 31.7(L) 35.0(L)  Platelets 150 - 400 K/uL 179 195 211   CMP Latest Ref Rng & Units 10/06/2020 10/05/2020 10/05/2020  Glucose 70 - 99 mg/dL 132(H) 103(H) 113(H)  BUN 8 - 23 mg/dL 14 12 15   Creatinine 0.61 - 1.24 mg/dL 1.51(H) 1.18 1.20  Sodium 135 - 145 mmol/L 125(L) 126(L) 123(L)  Potassium 3.5 - 5.1 mmol/L 3.4(L) 3.5 3.7  Chloride 98 - 111 mmol/L 93(L) 94(L) 90(L)  CO2 22 - 32 mmol/L 23 21(L) 21(L)  Calcium 8.9 - 10.3 mg/dL 7.5(L) 7.4(L) 8.0(L)  Total Protein 6.5 - 8.1 g/dL 6.2(L) - 7.4  Total Bilirubin 0.3 - 1.2 mg/dL 0.7 - 0.4  Alkaline Phos 38 - 126 U/L 45 - 51  AST 15 - 41 U/L 19 - 28  ALT 0 - 44 U/L 27 - 35       Micro Results Recent Results (from the past 240 hour(s))  SARS Coronavirus 2 by RT PCR (hospital order, performed in [facility] hospital lab) Nasopharyngeal Peripheral     Status: Abnormal   Collection Time: 10/05/20  1:09 PM   Specimen: Peripheral; Nasopharyngeal  Result Value Ref Range Status   SARS Coronavirus 2 POSITIVE (A) NEGATIVE Final    Comment: RESULT CALLED TO, READ BACK BY AND VERIFIED WITH: [place]. C, RN @ [id number] ON 10/05/2020, [email or web address] (NOTE) SARS-CoV-2 target nucleic acids are DETECTED  SARS-CoV-2 RNA is generally detectable in upper respiratory specimens  during the acute phase of infection.  Positive results are indicative  of the presence of the identified virus, but do not rule out bacterial infection or co-infection with other pathogens not detected by the test.  Clinical correlation with patient history and  other diagnostic information is necessary to determine patient infection  status.  The expected result is negative.  Fact Sheet for Patients:   StrictlyIdeas.no   Fact Sheet for Healthcare Providers:   BankingDealers.co.za    This test is not yet approved or cleared by the [place] FDA and  has been authorized for detection and/or diagnosis of SARS-CoV-2 by FDA under an Emergency Use Authorization (EUA).  This EUA will remain in effec t (meaning this test can be used) for the duration of  the COVID-19 declaration under Section 564(b)(1) of the Act, 21 U.S.C. section 360-bbb-3(b)(1), unless the authorization is terminated or revoked sooner.  Performed at [facility], [address] [place], [place] [address]   Blood Culture (routine x 2)     Status: None (Preliminary result)   Collection Time: 10/05/20  1:09 PM   Specimen: BLOOD  Result Value Ref Range Status   Specimen Description   Final    BLOOD LEFT ANTECUBITAL Performed at [facility], [place] [address] [place], [place] [address]    Special Requests   Final    BOTTLES DRAWN AEROBIC AND ANAEROBIC Blood Culture results may not be optimal due to an inadequate volume of blood received in culture bottles Performed at [facility], [address] [place], [place] [address]    Culture PENDING  Incomplete   Report Status PENDING  Incomplete  MRSA PCR Screening     Status: None   Collection Time: 10/05/20 10:35 PM   Specimen: Nasopharyngeal  Result Value Ref Range Status   MRSA by PCR NEGATIVE NEGATIVE Final    Comment:        The GeneXpert MRSA Assay (FDA approved for NASAL specimens only), is one component of a comprehensive MRSA colonization surveillance program. It is not intended to diagnose MRSA infection nor to guide or monitor treatment for MRSA infections. Performed at [facility], [place] [address], [place], [place] [address]     Radiology Reports DG Tibia/Fibula Right  Result Date:  10/05/2020 CLINICAL DATA:  Wound.  Cellulitis possible sepsis EXAM: RIGHT TIBIA AND FIBULA - 2 VIEW COMPARISON:  None. FINDINGS: There is no evidence of fracture or other focal bone lesions. Soft tissues are unremarkable. IMPRESSION: Negative. Electronically Signed   By: Franchot Gallo M.D.   On: 10/05/2020 16:30   US Venous Img Lower Right (DVT Study)  Result Date: 10/05/2020 CLINICAL DATA:  Edema, pain and erythema right lower extremity. EXAM: RIGHT LOWER EXTREMITY VENOUS DOPPLER ULTRASOUND TECHNIQUE: Gray-scale sonography with graded compression, as well as color Doppler and duplex ultrasound were performed to evaluate the lower extremity deep venous systems from the level of the common femoral vein and including the common femoral, femoral, profunda femoral, popliteal and calf veins including the posterior tibial, peroneal and gastrocnemius veins when visible. The superficial great saphenous vein was also interrogated. Spectral Doppler was utilized to evaluate flow at rest and with distal augmentation maneuvers in the common femoral, femoral and popliteal veins. COMPARISON:  None. FINDINGS: Contralateral Common Femoral Vein: Respiratory phasicity is normal and symmetric with the symptomatic side. No evidence of thrombus. Normal compressibility. Common Femoral Vein: No evidence of thrombus. Normal compressibility, respiratory phasicity and response to augmentation. Saphenofemoral Junction: No evidence of thrombus. Normal compressibility and flow on color Doppler imaging. Profunda Femoral Vein: No evidence of thrombus. Normal compressibility and flow on color Doppler imaging. Femoral Vein: No evidence of thrombus. Normal compressibility, respiratory phasicity and response to augmentation. Popliteal Vein: No evidence of thrombus. Normal  compressibility, respiratory phasicity and response to augmentation. Calf Veins: No evidence of thrombus. Normal compressibility and flow on color Doppler imaging. Superficial  Great Saphenous Vein: No evidence of thrombus. Normal compressibility. Venous Reflux:  None. Other Findings: No evidence of superficial thrombophlebitis or abnormal fluid collection. IMPRESSION: No evidence of right lower extremity deep venous thrombosis. Electronically Signed   By: Aletta Edouard M.D.   On: 10/05/2020 15:26   DG Chest Port 1 View  Result Date: 10/05/2020 CLINICAL DATA:  COVID positive.  Sepsis. EXAM: PORTABLE CHEST 1 VIEW COMPARISON:  11/08/2018 FINDINGS: Cardiac enlargement without heart failure. Lungs are well aerated and clear. No infiltrate or effusion. 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 IMPRESSION: No active disease. Electronically Signed   By: Franchot Gallo M.D.   On: 10/05/2020 17:50    SIGNED: Deatra James, MD, FHM. [facility],  Pager (please use [email or web address] to page/text) Please use Epic Secure Chat for non-urgent communication (7AM-7PM)  If 7PM-7AM, please contact night-coverage [email or web address], 10/06/2020, 8:30 AM

## 2020-10-06 NOTE — Plan of Care (Signed)

## 2020-10-06 NOTE — Progress Notes (Signed)
Pharmacy Antibiotic Note  Andres [name] is a 65 y.o. male admitted on 10/05/2020 with cellulitis. MRI of leg showed myositis with no drainable abscess. He acquired the injury initially at work and it worsened over the next few days. No cultures available.  Plan: Stop Vancomycin Stop cefepime  Start ceftriaxone 2g IV q24h Start Daptomycin 700mg  IV q24h (6mg /kg)  Height: 5\' 11"  (180.3 cm) Weight: 116.9 kg (257 lb 11.5 oz) IBW/kg (Calculated) : 75.3  Temp (24hrs), Avg:99.5 F (37.5 C), Min:97.6 F (36.4 C), Max:102.9 F (39.4 C)  Recent Labs  Lab 10/05/20 1249 10/05/20 1309 10/05/20 2118 10/06/20 0106  WBC 10.8*  --  9.4 9.9  CREATININE 1.20  --  1.18 1.51*  LATICACIDVEN  --  1.3  --   --     Estimated Creatinine Clearance: 65.9 mL/min (A) (by C-G formula based on SCr of 1.51 mg/dL (H)).    Allergies  Allergen Reactions  . Niacin And Related Shortness Of Breath    Antimicrobials this admission: Vancomycin 2/3>>2/4  Cefepime 2/3>>2/4 Metronidazole 500 mg IV x 1 Daptomycin 2/4>> Ceftriaxone 2/4>>    Microbiology results: Pending   Thank you for allowing pharmacy to be a part of this patient's care.  Phillis Haggis 10/06/2020 3:07 PM

## 2020-10-07 ENCOUNTER — Telehealth (HOSPITAL_BASED_OUTPATIENT_CLINIC_OR_DEPARTMENT_OTHER): Payer: Self-pay | Admitting: Emergency Medicine

## 2020-10-07 DIAGNOSIS — L03115 Cellulitis of right lower limb: Secondary | ICD-10-CM | POA: Diagnosis not present

## 2020-10-07 LAB — CBC WITH DIFFERENTIAL/PLATELET
Abs Immature Granulocytes: 0.05 10*3/uL (ref 0.00–0.07)
Basophils Absolute: 0 10*3/uL (ref 0.0–0.1)
Basophils Relative: 0 %
Eosinophils Absolute: 0 10*3/uL (ref 0.0–0.5)
Eosinophils Relative: 0 %
HCT: 35.1 % — ABNORMAL LOW (ref 39.0–52.0)
Hemoglobin: 11.5 g/dL — ABNORMAL LOW (ref 13.0–17.0)
Immature Granulocytes: 1 %
Lymphocytes Relative: 11 %
Lymphs Abs: 1.2 10*3/uL (ref 0.7–4.0)
MCH: 29.3 pg (ref 26.0–34.0)
MCHC: 32.8 g/dL (ref 30.0–36.0)
MCV: 89.5 fL (ref 80.0–100.0)
Monocytes Absolute: 0.5 10*3/uL (ref 0.1–1.0)
Monocytes Relative: 5 %
Neutro Abs: 8.9 10*3/uL — ABNORMAL HIGH (ref 1.7–7.7)
Neutrophils Relative %: 83 %
Platelets: 199 10*3/uL (ref 150–400)
RBC: 3.92 MIL/uL — ABNORMAL LOW (ref 4.22–5.81)
RDW: 14.6 % (ref 11.5–15.5)
WBC: 10.7 10*3/uL — ABNORMAL HIGH (ref 4.0–10.5)
nRBC: 0 % (ref 0.0–0.2)

## 2020-10-07 LAB — COMPREHENSIVE METABOLIC PANEL
ALT: 45 U/L — ABNORMAL HIGH (ref 0–44)
AST: 34 U/L (ref 15–41)
Albumin: 2.3 g/dL — ABNORMAL LOW (ref 3.5–5.0)
Alkaline Phosphatase: 47 U/L (ref 38–126)
Anion gap: 12 (ref 5–15)
BUN: 16 mg/dL (ref 8–23)
CO2: 19 mmol/L — ABNORMAL LOW (ref 22–32)
Calcium: 7.6 mg/dL — ABNORMAL LOW (ref 8.9–10.3)
Chloride: 94 mmol/L — ABNORMAL LOW (ref 98–111)
Creatinine, Ser: 1.11 mg/dL (ref 0.61–1.24)
GFR, Estimated: >60 mL/min (ref >60)
Glucose, Bld: 91 mg/dL (ref 70–99)
Potassium: 3.5 mmol/L (ref 3.5–5.1)
Sodium: 125 mmol/L — ABNORMAL LOW (ref 135–145)
Total Bilirubin: 0.8 mg/dL (ref 0.3–1.2)
Total Protein: 6.1 g/dL — ABNORMAL LOW (ref 6.5–8.1)

## 2020-10-07 LAB — OSMOLALITY: Osmolality: 260 mOsm/kg — ABNORMAL LOW (ref 275–295)

## 2020-10-07 LAB — OSMOLALITY, URINE: Osmolality, Ur: 431 mOsm/kg (ref 300–900)

## 2020-10-07 LAB — CK: Total CK: 55 U/L (ref 49–397)

## 2020-10-07 LAB — NA AND K (SODIUM & POTASSIUM), RAND UR
Potassium Urine: 25 mmol/L
Sodium, Ur: <10 mmol/L

## 2020-10-07 LAB — SODIUM: Sodium: 127 mmol/L — ABNORMAL LOW (ref 135–145)

## 2020-10-07 LAB — D-DIMER, QUANTITATIVE: D-Dimer, Quant: 1.31 ug/mL-FEU — ABNORMAL HIGH (ref 0.00–0.50)

## 2020-10-07 LAB — C-REACTIVE PROTEIN: CRP: 28 mg/dL — ABNORMAL HIGH (ref <1.0)

## 2020-10-07 MED ORDER — METHYLPREDNISOLONE SODIUM SUCC 40 MG IJ SOLR
40.0000 mg | Freq: Every day | INTRAMUSCULAR | Status: DC
  Administered 2020-10-07 – 2020-10-09 (×3): 40 mg via INTRAVENOUS
  Filled 2020-10-07 (×3): qty 1

## 2020-10-07 MED ORDER — SODIUM CHLORIDE 1 G PO TABS
1.0000 g | ORAL_TABLET | Freq: Two times a day (BID) | ORAL | Status: DC
  Administered 2020-10-07 – 2020-10-20 (×23): 1 g via ORAL
  Filled 2020-10-07 (×29): qty 1

## 2020-10-07 NOTE — Progress Notes (Signed)
PROGRESS NOTE    Patient: Andres [name]                            PCP: Mackie Pai, PA-C                    DOB: July 24, 1956            DOA: 10/05/2020 EHU:[id number]             DOS: 10/07/2020, 11:43 AM   LOS: 2 days   Date of Service: The patient was seen and examined on 10/07/2020  Subjective:   The patient was seen and examined this morning, remained stable, afebrile normotensive Complaining of generalized pain especially in the right lower extremity Stating he feels like redness and swelling has improved in his right leg  He was informed that based on MRI there is no bony infection-antibiotics have been modified  He remains on 2 L of oxygen satting 96%  Brief Narrative:   Eljay Lave is a 65 y.o. male with history of hypertension had a small skin injury on the right lower extremity while at work about a week ago.  About 2 days later patient's leg started having increasing swelling and redness and had taken oral antibiotics despite which the swelling was worsening and was instructed to come to the ER.   Last couple days patient has been having upper respiratory tract symptoms. He is not vaccinated for Covid -SARS-CoV-2 positive Also admitted for right lower extremity cellulitis.     Assessment & Plan:   Principal Problem:   Cellulitis of right lower extremity Active Problems:   Essential hypertension   Cellulitis   Hyponatremia   COVID-19 virus infection   Cellulitis of the right lower extremity  -Erythema edema tenderness anterior shin blisters with clear drainage noted in the right lower extremity below knee area does not involve the ankle or foot -MRI was reviewed in detail -as below -Since no underlying abscess or osteomyelitis identified IV antibiotics of vancomycin has been modified to Rocephin and daptomycin -Tetanus booster given in ED -Ultrasound right lower extremity negative for DVT   Addendum MRI of right lower extremity: IMPRESSION: 1.  Diffuse and fairly marked subcutaneous soft tissue swelling/edema/fluid consistent with severe cellulitis. No obvious discrete drainable soft tissue abscess. 2. Myositis mainly involving the anterior tibialis muscle but also the medial soleus muscle. 3. No definite findings for pyomyositis. 4. No findings for osteomyelitis.     COVID-19 infection infection  -Patient is now requiring 2 L of oxygen, to maintain O2 sat greater 90% currently 96% -Continue to complain shortness of breath, persistent dry cough -Initiated on IV remdesivir will continue for 3 days -added small dose steroids IV Solu-Medrol.. As patient shortness of breath is getting worse  requiring 2 L of oxygen now  -Monitor his respiratory status closely  Hyponatremia  -Unclear etiology, ruling out SIADH,  -Follow labs including urine studies -Sodium 126, 125 >>> 125   Hypertension -Patient has been on Cozaar -we will hold for now as creatinine mildly elevated -As needed hydralazine   Acute renal insufficiency -Improved -Avoiding nephrotoxins, (vancomycin discontinued)   Skin Assessment: I have examined the patient's skin and I agree with the wound assessment as performed by wound care team: Visible erythema edema right lower extremity below the knee up to the ankle area anterior shin multiple blisters some open draining clear liquid    ------------------------------------------------------------------------------------------------------------------------------------ Cultures; Blood Cultures: Negative to  date  Antimicrobials: 10/05/2020 IV cefepime/vancomycin 10/06/20 10/06/2020 IV Rocephin/daptomycin >>>   Consultants: None   -----------------------------------------------------------------------------------------------------------------------------------  DVT prophylaxis:  Lovenox SQ Code Status:   Code Status: Full Code  Family Communication: No family member present at bedside- attempt will be made  to update daily The above findings and plan of care has been discussed with patient (and family)  in detail,  they expressed understanding and agreement of above. -Advance care planning has been discussed.   Admission status:   Status is: Inpatient  Remains inpatient appropriate because:Inpatient level of care appropriate due to severity of illness Since patient has cellulitis of the right lower extremity with COVID infection will need close monitoring for any further worsening in inpatient status.  Dispo: The patient is from: Home              Anticipated d/c is to: Home              Anticipated d/c date is: 3 days              Patient currently is not medically stable to d/c.   Difficult to place patient No    Level of care: Med-Surg   Procedures:   No admission procedures for hospital encounter.     Antimicrobials:  Anti-infectives (From admission, onward)   Start     Dose/Rate Route Frequency Ordered Stop   10/06/20 2200  cefTRIAXone (ROCEPHIN) 2 g in sodium chloride 0.9 % 100 mL IVPB        2 g 200 mL/hr over 30 Minutes Intravenous Every 24 hours 10/06/20 1507     10/06/20 2000  DAPTOmycin (CUBICIN) 700 mg in sodium chloride 0.9 % IVPB        700 mg 228 mL/hr over 30 Minutes Intravenous Daily 10/06/20 1507     10/06/20 1800  vancomycin (VANCOREADY) IVPB 1500 mg/300 mL  Status:  Discontinued        1,500 mg 150 mL/hr over 120 Minutes Intravenous Every 24 hours 10/05/20 1430 10/06/20 1507   10/06/20 1000  remdesivir 100 mg in sodium chloride 0.9 % 100 mL IVPB  Status:  Discontinued        100 mg 200 mL/hr over 30 Minutes Intravenous Daily 10/05/20 1730 10/05/20 1932   10/06/20 1000  remdesivir 100 mg in sodium chloride 0.9 % 100 mL IVPB        100 mg 200 mL/hr over 30 Minutes Intravenous Daily 10/05/20 1932 10/07/20 0939   10/05/20 2200  ceFEPIme (MAXIPIME) 2 g in sodium chloride 0.9 % 100 mL IVPB  Status:  Discontinued        2 g 200 mL/hr over 30 Minutes  Intravenous Every 8 hours 10/05/20 1430 10/06/20 1507   10/05/20 1800  remdesivir 100 mg in sodium chloride 0.9 % 100 mL IVPB        100 mg 200 mL/hr over 30 Minutes Intravenous Every 30 min 10/05/20 1730 10/05/20 1904   10/05/20 1315  vancomycin (VANCOCIN) IVPB 1000 mg/200 mL premix       "And" Linked Group Details   1,000 mg 200 mL/hr over 60 Minutes Intravenous  Once 10/05/20 1300 10/05/20 1756   10/05/20 1315  vancomycin (VANCOCIN) IVPB 1000 mg/200 mL premix       "And" Linked Group Details   1,000 mg 200 mL/hr over 60 Minutes Intravenous  Once 10/05/20 1300 10/05/20 1707   10/05/20 1300  ceFEPIme (MAXIPIME) 2 g in sodium chloride 0.9 % 100 mL IVPB  2 g 200 mL/hr over 30 Minutes Intravenous  Once 10/05/20 1250 10/05/20 1427   10/05/20 1300  metroNIDAZOLE (FLAGYL) IVPB 500 mg        500 mg 100 mL/hr over 60 Minutes Intravenous  Once 10/05/20 1250 10/05/20 1542   10/05/20 1300  vancomycin (VANCOCIN) IVPB 1000 mg/200 mL premix  Status:  Discontinued        1,000 mg 200 mL/hr over 60 Minutes Intravenous  Once 10/05/20 1250 10/05/20 1300       Medication:  . vitamin C  500 mg Oral Daily  . aspirin  81 mg Oral Daily  . enoxaparin (LOVENOX) injection  60 mg Subcutaneous Q24H  . mouth rinse  15 mL Mouth Rinse BID  . zinc sulfate  220 mg Oral Daily    acetaminophen **OR** acetaminophen, hydrALAZINE, HYDROcodone-acetaminophen, HYDROmorphone (DILAUDID) injection, ondansetron (ZOFRAN) IV, ondansetron   Objective:   Vitals:   10/06/20 0805 10/06/20 1249 10/06/20 2004 10/07/20 0425  BP: 128/75 127/79 126/76 124/72  Pulse: 78 79 86 79  Resp: 18 (!) 22 16 17   Temp: 97.6 F (36.4 C) 98.2 F (36.8 C) 98.9 F (37.2 C) 98.9 F (37.2 C)  TempSrc: Oral Oral Oral Oral  SpO2: 100% 99% 97% 96%  Weight:      Height:        Intake/Output Summary (Last 24 hours) at 10/07/2020 1143 Last data filed at 10/07/2020 1000 Gross per 24 hour  Intake 651.85 ml  Output 1740 ml  Net -1088.15  ml   Filed Weights   10/05/20 1203 10/05/20 1430 10/05/20 1930  Weight: 107 kg 107 kg 116.9 kg     Examination:      Physical Exam:   General:  Alert, oriented, cooperative, no distress;   HEENT:  Normocephalic, PERRL, otherwise with in Normal limits   Neuro:  CNII-XII intact. , normal motor and sensation, reflexes intact   Lungs:    Diffuse Rales, rhonchi, positive breath sounds BL, Respirations unlabored, no wheezes / crackles  Cardio:    S1/S2, RRR, No murmure, No Rubs or Gallops   Abdomen:   Soft, non-tender, bowel sounds active all four quadrants,  no guarding or peritoneal signs.  Muscular skeletal:  Limited exam - in bed, able to move all 4 extremities, Normal strength,  2+ pulses,  symmetric, No pitting edema  Skin:  Dry, warm to touch, right lower extremity  Wounds: Right lower extremity-erythema edema superficial blisters with clear discharge       -----------------------------------------------------------------------------------------------------------------------------------    LABs:  CBC Latest Ref Rng & Units 10/07/2020 10/06/2020 10/05/2020  WBC 4.0 - 10.5 K/uL 10.7(H) 9.9 9.4  Hemoglobin 13.0 - 17.0 g/dL 11.5(L) 11.7(L) 10.8(L)  Hematocrit 39.0 - 52.0 % 35.1(L) 34.7(L) 31.7(L)  Platelets 150 - 400 K/uL 199 179 195   CMP Latest Ref Rng & Units 10/07/2020 10/06/2020 10/05/2020  Glucose 70 - 99 mg/dL 91 132(H) 103(H)  BUN 8 - 23 mg/dL 16 14 12   Creatinine 0.61 - 1.24 mg/dL 1.11 1.51(H) 1.18  Sodium 135 - 145 mmol/L 125(L) 125(L) 126(L)  Potassium 3.5 - 5.1 mmol/L 3.5 3.4(L) 3.5  Chloride 98 - 111 mmol/L 94(L) 93(L) 94(L)  CO2 22 - 32 mmol/L 19(L) 23 21(L)  Calcium 8.9 - 10.3 mg/dL 7.6(L) 7.5(L) 7.4(L)  Total Protein 6.5 - 8.1 g/dL 6.1(L) 6.2(L) -  Total Bilirubin 0.3 - 1.2 mg/dL 0.8 0.7 -  Alkaline Phos 38 - 126 U/L 47 45 -  AST 15 - 41 U/L 34  19 -  ALT 0 - 44 U/L 45(H) 27 -       Micro Results Recent Results (from the past 240 hour(s))  SARS Coronavirus  2 by RT PCR (hospital order, performed in [facility] hospital lab) Nasopharyngeal Peripheral     Status: Abnormal   Collection Time: 10/05/20  1:09 PM   Specimen: Peripheral; Nasopharyngeal  Result Value Ref Range Status   SARS Coronavirus 2 POSITIVE (A) NEGATIVE Final    Comment: RESULT CALLED TO, READ BACK BY AND VERIFIED WITH: ROUGHGARDEN. C, RN @ [id number] ON 10/05/2020, [email or web address] (NOTE) SARS-CoV-2 target nucleic acids are DETECTED  SARS-CoV-2 RNA is generally detectable in upper respiratory specimens  during the acute phase of infection.  Positive results are indicative  of the presence of the identified virus, but do not rule out bacterial infection or co-infection with other pathogens not detected by the test.  Clinical correlation with patient history and  other diagnostic information is necessary to determine patient infection status.  The expected result is negative.  Fact Sheet for Patients:   StrictlyIdeas.no   Fact Sheet for Healthcare Providers:   BankingDealers.co.za    This test is not yet approved or cleared by the [place] FDA and  has been authorized for detection and/or diagnosis of SARS-CoV-2 by FDA under an Emergency Use Authorization (EUA).  This EUA will remain in effec t (meaning this test can be used) for the duration of  the COVID-19 declaration under Section 564(b)(1) of the Act, 21 U.S.C. section 360-bbb-3(b)(1), unless the authorization is terminated or revoked sooner.  Performed at [facility], [address] [place], [place] [address]   Blood Culture (routine x 2)     Status: None (Preliminary result)   Collection Time: 10/05/20  1:09 PM   Specimen: BLOOD  Result Value Ref Range Status   Specimen Description   Final    BLOOD LEFT ANTECUBITAL Performed at [facility], [place] [address] [place], [place] [address]    Special Requests   Final    BOTTLES DRAWN AEROBIC AND  ANAEROBIC Blood Culture results may not be optimal due to an inadequate volume of blood received in culture bottles Performed at [facility], [address] [place], [place] [address]    Culture   Final    NO GROWTH 2 DAYS Performed at [facility], [place] [address], [place], [place] [address]    Report Status PENDING  Incomplete  Blood Culture (routine x 2)     Status: None (Preliminary result)   Collection Time: 10/05/20  1:10 PM   Specimen: BLOOD RIGHT FOREARM  Result Value Ref Range Status   Specimen Description   Final    BLOOD RIGHT FOREARM Performed at [facility], [address] [place], [place] [address]    Special Requests   Final    BOTTLES DRAWN AEROBIC AND ANAEROBIC Blood Culture results may not be optimal due to an inadequate volume of blood received in culture bottles Performed at [facility], [address] [place], [place] [address]    Culture   Final    NO GROWTH 2 DAYS Performed at [facility], [place] [address] [place], [place] [address]    Report Status PENDING  Incomplete  MRSA PCR Screening     Status: None   Collection Time: 10/05/20 10:35 PM   Specimen: Nasopharyngeal  Result Value Ref Range Status  MRSA by PCR NEGATIVE NEGATIVE Final    Comment:        The GeneXpert MRSA Assay (FDA approved for NASAL specimens only), is one component of a comprehensive MRSA colonization surveillance program. It is not intended to diagnose MRSA infection nor to guide or monitor treatment for MRSA infections. Performed at [facility], [place] [address], [place], [place] [address]     Radiology Reports DG Tibia/Fibula Right  Result Date: 10/05/2020 CLINICAL DATA:  Wound.  Cellulitis possible sepsis EXAM: RIGHT TIBIA AND FIBULA - 2 VIEW COMPARISON:  None. FINDINGS: There is no evidence of fracture or other focal bone lesions. Soft tissues are unremarkable. IMPRESSION: Negative. Electronically Signed    By: Franchot Gallo M.D.   On: 10/05/2020 16:30   MR TIBIA FIBULA RIGHT WO CONTRAST  Result Date: 10/06/2020 CLINICAL DATA:  Draining.  Evaluate for osteomyelitis. EXAM: MRI OF LOWER RIGHT EXTREMITY WITHOUT CONTRAST TECHNIQUE: Multiplanar, multisequence MR imaging of the right lower extremity was performed. No intravenous contrast was administered. COMPARISON:  Radiographs 10/05/2020 FINDINGS: Diffuse and fairly marked subcutaneous soft tissue swelling/edema/fluid consistent with severe cellulitis. There is also myositis mainly involving the anterior tibialis muscle but also the medial soleus muscle which is also surrounded by fluid. I do not see any definite findings to suggest pyomyositis. No discrete subcutaneous soft tissue abscess is identified. There is a skin blister noted along the medial aspect of the tibia. I do not see any findings suspicious for osteomyelitis. IMPRESSION: 1. Diffuse and fairly marked subcutaneous soft tissue swelling/edema/fluid consistent with severe cellulitis. No obvious discrete drainable soft tissue abscess. 2. Myositis mainly involving the anterior tibialis muscle but also the medial soleus muscle. 3. No definite findings for pyomyositis. 4. No findings for osteomyelitis. Electronically Signed   By: Marijo Sanes M.D.   On: 10/06/2020 14:01   US Venous Img Lower Right (DVT Study)  Result Date: 10/05/2020 CLINICAL DATA:  Edema, pain and erythema right lower extremity. EXAM: RIGHT LOWER EXTREMITY VENOUS DOPPLER ULTRASOUND TECHNIQUE: Gray-scale sonography with graded compression, as well as color Doppler and duplex ultrasound were performed to evaluate the lower extremity deep venous systems from the level of the common femoral vein and including the common femoral, femoral, profunda femoral, popliteal and calf veins including the posterior tibial, peroneal and gastrocnemius veins when visible. The superficial great saphenous vein was also interrogated. Spectral Doppler was  utilized to evaluate flow at rest and with distal augmentation maneuvers in the common femoral, femoral and popliteal veins. COMPARISON:  None. FINDINGS: Contralateral Common Femoral Vein: Respiratory phasicity is normal and symmetric with the symptomatic side. No evidence of thrombus. Normal compressibility. Common Femoral Vein: No evidence of thrombus. Normal compressibility, respiratory phasicity and response to augmentation. Saphenofemoral Junction: No evidence of thrombus. Normal compressibility and flow on color Doppler imaging. Profunda Femoral Vein: No evidence of thrombus. Normal compressibility and flow on color Doppler imaging. Femoral Vein: No evidence of thrombus. Normal compressibility, respiratory phasicity and response to augmentation. Popliteal Vein: No evidence of thrombus. Normal compressibility, respiratory phasicity and response to augmentation. Calf Veins: No evidence of thrombus. Normal compressibility and flow on color Doppler imaging. Superficial Great Saphenous Vein: No evidence of thrombus. Normal compressibility. Venous Reflux:  None. Other Findings: No evidence of superficial thrombophlebitis or abnormal fluid collection. IMPRESSION: No evidence of right lower extremity deep venous thrombosis. Electronically Signed   By: Aletta Edouard M.D.   On: 10/05/2020 15:26   DG Chest Port 1 View  Result Date:  10/05/2020 CLINICAL DATA:  COVID positive.  Sepsis. EXAM: PORTABLE CHEST 1 VIEW COMPARISON:  11/08/2018 FINDINGS: Cardiac enlargement without heart failure. Lungs are well aerated and clear. No infiltrate or effusion. 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 IMPRESSION: No active disease. Electronically Signed   By: Franchot Gallo M.D.   On: 10/05/2020 17:50    SIGNED: Deatra James, MD, FHM. [facility],  Pager (please use [email or web address] to page/text) Please use Epic Secure Chat for non-urgent communication (7AM-7PM)  If 7PM-7AM, please contact  night-coverage [email or web address], 10/07/2020, 11:43 AM

## 2020-10-07 NOTE — Plan of Care (Signed)
  Problem: Education: Goal: Knowledge of General Education information will improve Description: Including pain rating scale, medication(s)/side effects and non-pharmacologic comfort measures Outcome: Progressing   Problem: Clinical Measurements: Goal: Will remain free from infection Outcome: Progressing   Problem: Clinical Measurements: Goal: Respiratory complications will improve Outcome: Progressing   

## 2020-10-07 NOTE — Progress Notes (Signed)
PHARMACY - PHYSICIAN COMMUNICATION CRITICAL VALUE ALERT - BLOOD CULTURE IDENTIFICATION (BCID)  Kalob Bergen is an 65 y.o. male who presented to [facility] on 10/05/2020 with a chief complaint of  Chief Complaint  Patient presents with  . Leg Pain     Assessment:  small skin injury on the right lower extremity while at work about a week ago. About 2 days later patient's leg started having increasing swelling and redness and had taken oral antibiotics despite which the swelling was worsening   Name of physician (or Provider) Contacted: Dr. Roger Shelter  Current antibiotics: daptomycin and ceftriaxone   Changes to prescribed antibiotics recommended:  Patient is on recommended antibiotics - No changes needed  No results found for this or any previous visit.    Royetta Asal, PharmD, BCPS 10/07/2020 4:16 PM

## 2020-10-08 DIAGNOSIS — L03115 Cellulitis of right lower limb: Secondary | ICD-10-CM | POA: Diagnosis not present

## 2020-10-08 LAB — COMPREHENSIVE METABOLIC PANEL
ALT: 43 U/L (ref 0–44)
AST: 27 U/L (ref 15–41)
Albumin: 2.4 g/dL — ABNORMAL LOW (ref 3.5–5.0)
Alkaline Phosphatase: 67 U/L (ref 38–126)
Anion gap: 11 (ref 5–15)
BUN: 20 mg/dL (ref 8–23)
CO2: 23 mmol/L (ref 22–32)
Calcium: 7.8 mg/dL — ABNORMAL LOW (ref 8.9–10.3)
Chloride: 91 mmol/L — ABNORMAL LOW (ref 98–111)
Creatinine, Ser: 0.98 mg/dL (ref 0.61–1.24)
GFR, Estimated: >60 mL/min (ref >60)
Glucose, Bld: 182 mg/dL — ABNORMAL HIGH (ref 70–99)
Potassium: 4.1 mmol/L (ref 3.5–5.1)
Sodium: 125 mmol/L — ABNORMAL LOW (ref 135–145)
Total Bilirubin: 0.3 mg/dL (ref 0.3–1.2)
Total Protein: 6.4 g/dL — ABNORMAL LOW (ref 6.5–8.1)

## 2020-10-08 LAB — CBC WITH DIFFERENTIAL/PLATELET
Abs Immature Granulocytes: 0.34 10*3/uL — ABNORMAL HIGH (ref 0.00–0.07)
Basophils Absolute: 0.1 10*3/uL (ref 0.0–0.1)
Basophils Relative: 0 %
Eosinophils Absolute: 0.1 10*3/uL (ref 0.0–0.5)
Eosinophils Relative: 1 %
HCT: 34.4 % — ABNORMAL LOW (ref 39.0–52.0)
Hemoglobin: 11.4 g/dL — ABNORMAL LOW (ref 13.0–17.0)
Immature Granulocytes: 2 %
Lymphocytes Relative: 7 %
Lymphs Abs: 1 10*3/uL (ref 0.7–4.0)
MCH: 29 pg (ref 26.0–34.0)
MCHC: 33.1 g/dL (ref 30.0–36.0)
MCV: 87.5 fL (ref 80.0–100.0)
Monocytes Absolute: 0.4 10*3/uL (ref 0.1–1.0)
Monocytes Relative: 2 %
Neutro Abs: 12.7 10*3/uL — ABNORMAL HIGH (ref 1.7–7.7)
Neutrophils Relative %: 88 %
Platelets: 250 10*3/uL (ref 150–400)
RBC: 3.93 MIL/uL — ABNORMAL LOW (ref 4.22–5.81)
RDW: 14.3 % (ref 11.5–15.5)
WBC: 14.5 10*3/uL — ABNORMAL HIGH (ref 4.0–10.5)
nRBC: 0 % (ref 0.0–0.2)

## 2020-10-08 LAB — C-REACTIVE PROTEIN: CRP: 25.3 mg/dL — ABNORMAL HIGH (ref <1.0)

## 2020-10-08 LAB — D-DIMER, QUANTITATIVE: D-Dimer, Quant: 1.58 ug/mL-FEU — ABNORMAL HIGH (ref 0.00–0.50)

## 2020-10-08 NOTE — Progress Notes (Signed)
Shift Summary:   Remained on 2L of nasal cannula throughout the shift. Remained neurologically intact. RR even and unlabored. Cautioned to use the call bell. Right leg re-wrapped with gauze and cleansed with normal saline. Tolerated all meals fine. No pain needs identified throughout the shift. VSS during this shift.  Will continue to monitor.

## 2020-10-08 NOTE — Progress Notes (Signed)
PROGRESS NOTE    Patient: Andres [name]                            PCP: Mackie Pai, PA-C                    DOB: 1956/03/06            DOA: 10/05/2020 [id number]             DOS: 10/08/2020, 11:43 AM   LOS: 3 days   Date of Service: The patient was seen and examined on 10/08/2020  Subjective:   Patient was seen and examined this morning, stable no acute distress Remains on 2 L of oxygen, satting 98% Reporting the pain redness and swelling has improved in her right leg  Brief Narrative:   Andres [name] is a 65 y.o. male with history of hypertension had a small skin injury on the right lower extremity while at work about a week ago.  About 2 days later patient's leg started having increasing swelling and redness and had taken oral antibiotics despite which the swelling was worsening and was instructed to come to the ER.   Last couple days patient has been having upper respiratory tract symptoms. He is not vaccinated for Covid -SARS-CoV-2 positive Also admitted for right lower extremity cellulitis.     Assessment & Plan:   Principal Problem:   Cellulitis of right lower extremity Active Problems:   Essential hypertension   Cellulitis   Hyponatremia   COVID-19 virus infection   Cellulitis of the right lower extremity  -Afebrile normotensive -Right lower extremity wound was inspected-improved erythema, edema, anterior shin -superficial open blisters -clear liquid drainage noted -MRI was reviewed in detail -as below -Since no underlying abscess or osteomyelitis identified IV antibiotics of vancomycin has been modified to Rocephin and daptomycin -Tetanus booster given in ED -Ultrasound right lower extremity negative for DVT   Addendum MRI of right lower extremity: IMPRESSION: 1. Diffuse and fairly marked subcutaneous soft tissue swelling/edema/fluid consistent with severe cellulitis. No obvious discrete drainable soft tissue abscess. 2. Myositis mainly involving  the anterior tibialis muscle but also the medial soleus muscle. 3. No definite findings for pyomyositis. 4. No findings for osteomyelitis.  ?  Bacteremia - Blood cultures collected on 10/05/2020  -1/2 CX growing gram-positive rods (likely contaminant 10/08/2020 blood cultures -recultured  -We will continue current antibiotics of Rocephin and daptomycin    COVID-19 infection infection  -Continues to complain of cough shortness of breath especially with exertion -Remains on 2 L of oxygen, satting 98% -On low-dose IV steroids -with anticipation of quick taper -Initiated on IV remdesivir will continue for 3-5 days  -Monitor his respiratory status closely  Hyponatremia  -Asymptomatic -Unclear etiology, ruling out SIADH,  -Following labs as of now seems to be preazotemia -Sodium 126, 125 >>> 125  >> 125 -Initiating sodium supplements,  -Discussed regarding -fluid restriction  Hypertension -Patient has been on Cozaar -we will hold for now as creatinine mildly elevated -As needed hydralazine   Acute renal insufficiency -Resolved -Avoiding nephrotoxins, (vancomycin discontinued)   Skin Assessment: I have examined the patient's skin and I agree with the wound assessment as performed by wound care team: Visible erythema edema right lower extremity below the knee up to the ankle area anterior shin multiple blisters some open draining clear liquid    ------------------------------------------------------------------------------------------------------------------------------------ Cultures; 10/05/2020 blood Cultures: Negative to date- 1/2 CX growing gram-positive rods (likely  contaminant 10/08/2020 blood cultures -recultured >>   Antimicrobials: 10/05/2020 IV cefepime/vancomycin 10/06/20 10/06/2020 IV Rocephin/daptomycin >>>   Consultants: None ID pharmacy consulted managing IV  antibiotics  -----------------------------------------------------------------------------------------------------------------------------------  DVT prophylaxis:  Lovenox SQ Code Status:   Code Status: Full Code  Family Communication: No family member present at bedside-The above findings and plan of care has been discussed with patient  in detail,  they expressed understanding and agreement of above. -Advance care planning has been discussed.   Admission status:   Status is: Inpatient  Remains inpatient appropriate because:Inpatient level of care appropriate due to severity of illness Since patient has cellulitis of the right lower extremity with COVID infection will need close monitoring for any further worsening in inpatient status.  Dispo: The patient is from: Home              Anticipated d/c is to: Home              Anticipated d/c date is: 3 days              Patient currently is not medically stable to d/c.   Difficult to place patient No    Level of care: Med-Surg   Procedures:   No admission procedures for hospital encounter.     Antimicrobials:  Anti-infectives (From admission, onward)   Start     Dose/Rate Route Frequency Ordered Stop   10/06/20 2200  cefTRIAXone (ROCEPHIN) 2 g in sodium chloride 0.9 % 100 mL IVPB        2 g 200 mL/hr over 30 Minutes Intravenous Every 24 hours 10/06/20 1507     10/06/20 2000  DAPTOmycin (CUBICIN) 700 mg in sodium chloride 0.9 % IVPB        700 mg 228 mL/hr over 30 Minutes Intravenous Daily 10/06/20 1507     10/06/20 1800  vancomycin (VANCOREADY) IVPB 1500 mg/300 mL  Status:  Discontinued        1,500 mg 150 mL/hr over 120 Minutes Intravenous Every 24 hours 10/05/20 1430 10/06/20 1507   10/06/20 1000  remdesivir 100 mg in sodium chloride 0.9 % 100 mL IVPB  Status:  Discontinued        100 mg 200 mL/hr over 30 Minutes Intravenous Daily 10/05/20 1730 10/05/20 1932   10/06/20 1000  remdesivir 100 mg in sodium chloride 0.9 %  100 mL IVPB        100 mg 200 mL/hr over 30 Minutes Intravenous Daily 10/05/20 1932 10/07/20 0939   10/05/20 2200  ceFEPIme (MAXIPIME) 2 g in sodium chloride 0.9 % 100 mL IVPB  Status:  Discontinued        2 g 200 mL/hr over 30 Minutes Intravenous Every 8 hours 10/05/20 1430 10/06/20 1507   10/05/20 1800  remdesivir 100 mg in sodium chloride 0.9 % 100 mL IVPB        100 mg 200 mL/hr over 30 Minutes Intravenous Every 30 min 10/05/20 1730 10/05/20 1904   10/05/20 1315  vancomycin (VANCOCIN) IVPB 1000 mg/200 mL premix       "And" Linked Group Details   1,000 mg 200 mL/hr over 60 Minutes Intravenous  Once 10/05/20 1300 10/05/20 1756   10/05/20 1315  vancomycin (VANCOCIN) IVPB 1000 mg/200 mL premix       "And" Linked Group Details   1,000 mg 200 mL/hr over 60 Minutes Intravenous  Once 10/05/20 1300 10/05/20 1707   10/05/20 1300  ceFEPIme (MAXIPIME) 2 g in sodium chloride 0.9 % 100 mL  IVPB        2 g 200 mL/hr over 30 Minutes Intravenous  Once 10/05/20 1250 10/05/20 1427   10/05/20 1300  metroNIDAZOLE (FLAGYL) IVPB 500 mg        500 mg 100 mL/hr over 60 Minutes Intravenous  Once 10/05/20 1250 10/05/20 1542   10/05/20 1300  vancomycin (VANCOCIN) IVPB 1000 mg/200 mL premix  Status:  Discontinued        1,000 mg 200 mL/hr over 60 Minutes Intravenous  Once 10/05/20 1250 10/05/20 1300       Medication:  . vitamin C  500 mg Oral Daily  . aspirin  81 mg Oral Daily  . enoxaparin (LOVENOX) injection  60 mg Subcutaneous Q24H  . mouth rinse  15 mL Mouth Rinse BID  . methylPREDNISolone (SOLU-MEDROL) injection  40 mg Intravenous Daily  . sodium chloride  1 g Oral BID WC  . zinc sulfate  220 mg Oral Daily    acetaminophen **OR** acetaminophen, hydrALAZINE, HYDROcodone-acetaminophen, HYDROmorphone (DILAUDID) injection, ondansetron (ZOFRAN) IV, ondansetron   Objective:   Vitals:   10/07/20 0425 10/07/20 1201 10/07/20 2057 10/08/20 0639  BP: 124/72 (!) 141/77 (!) 142/82 134/78  Pulse: 79 74  66 63  Resp: 17 20 18 16   Temp: 98.9 F (37.2 C) 98.1 F (36.7 C) (!) 97.5 F (36.4 C) 97.8 F (36.6 C)  TempSrc: Oral Oral Oral Oral  SpO2: 96% 97% 100% 98%  Weight:      Height:        Intake/Output Summary (Last 24 hours) at 10/08/2020 1143 Last data filed at 10/07/2020 2304 Gross per 24 hour  Intake 0 ml  Output 1750 ml  Net -1750 ml   Filed Weights   10/05/20 1203 10/05/20 1430 10/05/20 1930  Weight: 107 kg 107 kg 116.9 kg     Examination:     Physical Exam:   General:  Alert, oriented, cooperative, no distress;   HEENT:  Normocephalic, PERRL, otherwise with in Normal limits   Neuro:  CNII-XII intact. , normal motor and sensation, reflexes intact   Lungs:   Clear to auscultation BL, Respirations unlabored, no wheezes / crackles  Cardio:    S1/S2, RRR, No murmure, No Rubs or Gallops   Abdomen:   Soft, non-tender, bowel sounds active all four quadrants,  no guarding or peritoneal signs.  Muscular skeletal:  Limited exam - in bed, able to move all 4 extremities, Normal strength,  2+ pulses,  symmetric, No pitting edema  Skin:  Dry, warm to touch, negative for any Rashes, right lower extremity cellulitis-dressing in place -removed and inspected-   Wounds: Please see nursing documentation Right lower extremity-erythema edema superficial blisters with clear drainage          -----------------------------------------------------------------------------------------------------------------------------------    LABs:  CBC Latest Ref Rng & Units 10/08/2020 10/07/2020 10/06/2020  WBC 4.0 - 10.5 K/uL 14.5(H) 10.7(H) 9.9  Hemoglobin 13.0 - 17.0 g/dL 11.4(L) 11.5(L) 11.7(L)  Hematocrit 39.0 - 52.0 % 34.4(L) 35.1(L) 34.7(L)  Platelets 150 - 400 K/uL 250 199 179   CMP Latest Ref Rng & Units 10/08/2020 10/07/2020 10/07/2020  Glucose 70 - 99 mg/dL 182(H) - 91  BUN 8 - 23 mg/dL 20 - 16  Creatinine 0.61 - 1.24 mg/dL 0.98 - 1.11  Sodium 135 - 145 mmol/L 125(L) 127(L) 125(L)  Potassium  3.5 - 5.1 mmol/L 4.1 - 3.5  Chloride 98 - 111 mmol/L 91(L) - 94(L)  CO2 22 - 32 mmol/L 23 - 19(L)  Calcium  8.9 - 10.3 mg/dL 7.8(L) - 7.6(L)  Total Protein 6.5 - 8.1 g/dL 6.4(L) - 6.1(L)  Total Bilirubin 0.3 - 1.2 mg/dL 0.3 - 0.8  Alkaline Phos 38 - 126 U/L 67 - 47  AST 15 - 41 U/L 27 - 34  ALT 0 - 44 U/L 43 - 45(H)       Micro Results Recent Results (from the past 240 hour(s))  SARS Coronavirus 2 by RT PCR (hospital order, performed in [facility] hospital lab) Nasopharyngeal Peripheral     Status: Abnormal   Collection Time: 10/05/20  1:09 PM   Specimen: Peripheral; Nasopharyngeal  Result Value Ref Range Status   SARS Coronavirus 2 POSITIVE (A) NEGATIVE Final    Comment: RESULT CALLED TO, READ BACK BY AND VERIFIED WITH: R[place]. C, RN @ 0254 ON 10/05/2020, [email or web address] (NOTE) SARS-CoV-2 target nucleic acids are DETECTED  SARS-CoV-2 RNA is generally detectable in upper respiratory specimens  during the acute phase of infection.  Positive results are indicative  of the presence of the identified virus, but do not rule out bacterial infection or co-infection with other pathogens not detected by the test.  Clinical correlation with patient history and  other diagnostic information is necessary to determine patient infection status.  The expected result is negative.  Fact Sheet for Patients:   StrictlyIdeas.no   Fact Sheet for Healthcare Providers:   BankingDealers.co.za    This test is not yet approved or cleared by the [place] FDA and  has been authorized for detection and/or diagnosis of SARS-CoV-2 by FDA under an Emergency Use Authorization (EUA).  This EUA will remain in effec t (meaning this test can be used) for the duration of  the COVID-19 declaration under Section 564(b)(1) of the Act, 21 U.S.C. section 360-bbb-3(b)(1), unless the authorization is terminated or revoked sooner.  Performed at [facility], [address] [place], [place] [address]   Blood Culture (routine x 2)     Status: None (Preliminary result)   Collection Time: 10/05/20  1:09 PM   Specimen: BLOOD  Result Value Ref Range Status   Specimen Description   Final    BLOOD LEFT ANTECUBITAL Performed at [facility], [place] [address], [place], [place] [address]    Special Requests   Final    BOTTLES DRAWN AEROBIC AND ANAEROBIC Blood Culture results may not be optimal due to an inadequate volume of blood received in culture bottles Performed at [facility], [address] [place], [place] [address]    Culture  Setup Time   Final    GRAM POSITIVE RODS AEROBIC BOTTLE ONLY CRITICAL RESULT CALLED TO, READ BACK BY AND VERIFIED WITH: PHARMD N [place] [id number] AT 7106 BY CM Performed at [facility], [place] [address], [place], [place] [address]    Culture GRAM POSITIVE RODS  Final   Report Status PENDING  Incomplete  Blood Culture (routine x 2)     Status: None (Preliminary result)   Collection Time: 10/05/20  1:10 PM   Specimen: BLOOD RIGHT FOREARM  Result Value Ref Range Status   Specimen Description   Final    BLOOD RIGHT FOREARM Performed at [facility], [address] [place], [place] [address]    Special Requests   Final    BOTTLES DRAWN AEROBIC AND ANAEROBIC Blood Culture results may not be optimal due to an inadequate volume of blood received in culture bottles Performed at [facility], [address] [place], [place] [address]    Culture   Final    NO GROWTH 2 DAYS Performed at [facility], [place] [address], [place], [place] [address]    Report Status PENDING  Incomplete  MRSA PCR Screening     Status: None   Collection Time: 10/05/20 10:35 PM   Specimen: Nasopharyngeal  Result Value Ref Range Status   MRSA by PCR NEGATIVE NEGATIVE Final    Comment:        The GeneXpert MRSA Assay (FDA approved for NASAL specimens only), is one component of  a comprehensive MRSA colonization surveillance program. It is not intended to diagnose MRSA infection nor to guide or monitor treatment for MRSA infections. Performed at [facility], [place] [address] [place], [place] [address]     Radiology Reports DG Tibia/Fibula Right  Result Date: 10/05/2020 CLINICAL DATA:  Wound.  Cellulitis possible sepsis EXAM: RIGHT TIBIA AND FIBULA - 2 VIEW COMPARISON:  None. FINDINGS: There is no evidence of fracture or other focal bone lesions. Soft tissues are unremarkable. IMPRESSION: Negative. Electronically Signed   By: Franchot Gallo M.D.   On: 10/05/2020 16:30   MR TIBIA FIBULA RIGHT WO CONTRAST  Result Date: 10/06/2020 CLINICAL DATA:  Draining.  Evaluate for osteomyelitis. EXAM: MRI OF LOWER RIGHT EXTREMITY WITHOUT CONTRAST TECHNIQUE: Multiplanar, multisequence MR imaging of the right lower extremity was performed. No intravenous contrast was administered. COMPARISON:  Radiographs 10/05/2020 FINDINGS: Diffuse and fairly marked subcutaneous soft tissue swelling/edema/fluid consistent with severe cellulitis. There is also myositis mainly involving the anterior tibialis muscle but also the medial soleus muscle which is also surrounded by fluid. I do not see any definite findings to suggest pyomyositis. No discrete subcutaneous soft tissue abscess is identified. There is a skin blister noted along the medial aspect of the tibia. I do not see any findings suspicious for osteomyelitis. IMPRESSION: 1. Diffuse and fairly marked subcutaneous soft tissue swelling/edema/fluid consistent with severe cellulitis. No obvious discrete drainable soft tissue abscess. 2. Myositis mainly involving the anterior tibialis muscle but also the medial soleus muscle. 3. No definite findings for pyomyositis. 4. No findings for osteomyelitis. Electronically Signed   By: Marijo Sanes M.D.   On: 10/06/2020 14:01   US Venous Img Lower Right (DVT Study)  Result Date:  10/05/2020 CLINICAL DATA:  Edema, pain and erythema right lower extremity. EXAM: RIGHT LOWER EXTREMITY VENOUS DOPPLER ULTRASOUND TECHNIQUE: Gray-scale sonography with graded compression, as well as color Doppler and duplex ultrasound were performed to evaluate the lower extremity deep venous systems from the level of the common femoral vein and including the common femoral, femoral, profunda femoral, popliteal and calf veins including the posterior tibial, peroneal and gastrocnemius veins when visible. The superficial great saphenous vein was also interrogated. Spectral Doppler was utilized to evaluate flow at rest and with distal augmentation maneuvers in the common femoral, femoral and popliteal veins. COMPARISON:  None. FINDINGS: Contralateral Common Femoral Vein: Respiratory phasicity is normal and symmetric with the symptomatic side. No evidence of thrombus. Normal compressibility. Common Femoral Vein: No evidence of thrombus. Normal compressibility, respiratory phasicity and response to augmentation. Saphenofemoral Junction: No evidence of thrombus. Normal compressibility and flow on color Doppler imaging. Profunda Femoral Vein: No evidence of thrombus. Normal compressibility and flow on color Doppler imaging. Femoral Vein: No evidence of thrombus. Normal compressibility, respiratory phasicity and response to augmentation. Popliteal Vein: No evidence of thrombus. Normal compressibility, respiratory phasicity and response  to augmentation. Calf Veins: No evidence of thrombus. Normal compressibility and flow on color Doppler imaging. Superficial Great Saphenous Vein: No evidence of thrombus. Normal compressibility. Venous Reflux:  None. Other Findings: No evidence of superficial thrombophlebitis or abnormal fluid collection. IMPRESSION: No evidence of right lower extremity deep venous thrombosis. Electronically Signed   By: Aletta Edouard M.D.   On: 10/05/2020 15:26   DG Chest Port 1 View  Result Date:  10/05/2020 CLINICAL DATA:  COVID positive.  Sepsis. EXAM: PORTABLE CHEST 1 VIEW COMPARISON:  11/08/2018 FINDINGS: Cardiac enlargement without heart failure. Lungs are well aerated and clear. No infiltrate or effusion. 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 IMPRESSION: No active disease. Electronically Signed   By: Franchot Gallo M.D.   On: 10/05/2020 17:50    SIGNED: Deatra James, MD, FHM. [facility],  Pager (please use [email or web address] to page/text) Please use Epic Secure Chat for non-urgent communication (7AM-7PM)  If 7PM-7AM, please contact night-coverage [email or web address], 10/08/2020, 11:43 AM

## 2020-10-09 DIAGNOSIS — L03115 Cellulitis of right lower limb: Secondary | ICD-10-CM | POA: Diagnosis not present

## 2020-10-09 LAB — COMPREHENSIVE METABOLIC PANEL
ALT: 38 U/L (ref 0–44)
AST: 22 U/L (ref 15–41)
Albumin: 2.1 g/dL — ABNORMAL LOW (ref 3.5–5.0)
Alkaline Phosphatase: 59 U/L (ref 38–126)
Anion gap: 9 (ref 5–15)
BUN: 24 mg/dL — ABNORMAL HIGH (ref 8–23)
CO2: 25 mmol/L (ref 22–32)
Calcium: 7.9 mg/dL — ABNORMAL LOW (ref 8.9–10.3)
Chloride: 98 mmol/L (ref 98–111)
Creatinine, Ser: 1.04 mg/dL (ref 0.61–1.24)
GFR, Estimated: >60 mL/min (ref >60)
Glucose, Bld: 189 mg/dL — ABNORMAL HIGH (ref 70–99)
Potassium: 4.2 mmol/L (ref 3.5–5.1)
Sodium: 132 mmol/L — ABNORMAL LOW (ref 135–145)
Total Bilirubin: 0.5 mg/dL (ref 0.3–1.2)
Total Protein: 5.5 g/dL — ABNORMAL LOW (ref 6.5–8.1)

## 2020-10-09 LAB — CBC WITH DIFFERENTIAL/PLATELET
Abs Immature Granulocytes: 0.87 10*3/uL — ABNORMAL HIGH (ref 0.00–0.07)
Basophils Absolute: 0.1 10*3/uL (ref 0.0–0.1)
Basophils Relative: 0 %
Eosinophils Absolute: 0 10*3/uL (ref 0.0–0.5)
Eosinophils Relative: 0 %
HCT: 33.2 % — ABNORMAL LOW (ref 39.0–52.0)
Hemoglobin: 11 g/dL — ABNORMAL LOW (ref 13.0–17.0)
Immature Granulocytes: 4 %
Lymphocytes Relative: 6 %
Lymphs Abs: 1.5 10*3/uL (ref 0.7–4.0)
MCH: 28.8 pg (ref 26.0–34.0)
MCHC: 33.1 g/dL (ref 30.0–36.0)
MCV: 86.9 fL (ref 80.0–100.0)
Monocytes Absolute: 1.5 10*3/uL — ABNORMAL HIGH (ref 0.1–1.0)
Monocytes Relative: 6 %
Neutro Abs: 19.4 10*3/uL — ABNORMAL HIGH (ref 1.7–7.7)
Neutrophils Relative %: 84 %
Platelets: 296 10*3/uL (ref 150–400)
RBC: 3.82 MIL/uL — ABNORMAL LOW (ref 4.22–5.81)
RDW: 14.5 % (ref 11.5–15.5)
WBC: 23.3 10*3/uL — ABNORMAL HIGH (ref 4.0–10.5)
nRBC: 0 % (ref 0.0–0.2)

## 2020-10-09 LAB — CULTURE, BLOOD (ROUTINE X 2)

## 2020-10-09 LAB — D-DIMER, QUANTITATIVE: D-Dimer, Quant: 0.69 ug/mL-FEU — ABNORMAL HIGH (ref 0.00–0.50)

## 2020-10-09 LAB — C-REACTIVE PROTEIN: CRP: 10.4 mg/dL — ABNORMAL HIGH (ref <1.0)

## 2020-10-09 NOTE — Progress Notes (Signed)
PROGRESS NOTE  Andres [name]  DOB: [birth year]  PCP: Mackie Pai, PA-C EYC:[id number]  DOA: 10/05/2020  LOS: 4 days   Chief Complaint  Patient presents with  . Leg Pain    Brief narrative: Andres [name] a 65 y.o.malewithhistory of hypertension.   Patient presented to the ED on 10/05/2020 with worsening right lower extremity cellulitis.  He had a small skin injury on the right lower extremity while at work about a week ago. About 2 days later patient's leg started having increasing swelling and redness.  He was tried on oral antibiotics despite which cellulitis worsened and hence he presented to the ED.   Of note also he mentioned upper respiratory tract symptoms for the last few days.    In the ED, patient was Covid positive, required 2 L oxygen by nasal cannula. MRI of right leg showed diffuse and fairly marked subcutaneous soft tissue swelling/edema/fluid consistent with severe cellulitis.  No evidence of abscess.  It also showed myositis involving the anterior tibialis muscle and medial soleus muscle.  No evidence of pyomyositis or osteomyelitis. Patient was admitted to hospitalist service and started on IV antibiotics.  Subjective: Patient was seen and examined this morning. Pleasant middle-aged Caucasian male.  Lying on bed.  Not in distress.  Feels better than at presentation.  Not on supplemental oxygen this morning.  Assessment/Plan: Cellulitis of the right lower extremity Worsening leukocytosis -Afebrile, hemodynamically stable -WBC count however seems to be worsening, may be related to steroids. -Currently on IV Rocephin and IV daptomycin. -Continue to monitor. -Ultrasound negative for DVT. 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 -We will consult orthopedics.  Blood culture positive for Corynebacterium pseudodiphtheriae -Probably contaminant.  No growth on repeat blood culture.  COVID-19 infection -Complaint cough, shortness of breath.  Required low-flow oxygen. -Chest x-ray normal. -Currently no respiratory distress.  Not requiring supplemental oxygen.  CRP improving. -He is not on remdesivir but on 40 mg IV Solu-Medrol daily.  I will stop steroids at this time especially to avoid suppressing immunity while having a bacterial infection. -Monitor respiratory status. Recent Labs  Lab 10/05/20 1249 10/05/20 1309 10/05/20 1310 10/05/20 2118 10/06/20 0106 10/07/20 0525 10/08/20 0026 10/09/20 0347  SARSCOV2NAA  --  POSITIVE*  --   --   --   --   --   --   WBC 10.8*  --   --  9.4 9.9 10.7* 14.5* 23.3*  LATICACIDVEN  --  1.3  --   --   --   --   --   --   DDIMER  --   --   --  1.08* 0.99* 1.31* 1.58* 0.69*  FERRITIN  --   --  179  --   --   --   --   --   CRP  --   --  26.4*  --  26.7* 28.0* 25.3* 10.4*  ALT 35  --   --   --  27 45* 43 38   Hyponatremia  -Probably because of infection itself.  Improving sodium level now. -Not on IV fluids. Recent Labs  Lab 10/05/20 1249 10/05/20 2118 10/06/20 0106 10/07/20 1497 10/08/20 0026 10/09/20 0263  NA 123* 126* 125* 127*  125* 125* 132*   AKI -Creatinine peaked at 1.51, subsequently improved. Recent Labs    05/01/20 0807 10/05/20 1249 10/05/20 2118 10/06/20 0106 10/07/20 0525 10/08/20 0026 10/09/20 0347  BUN 14 15 12 14 16 20  24*  CREATININE 1.00 1.20 1.18 1.51* 1.11 0.98 1.04   Essential hypertension -Currently blood pressure in normal range without meds.  Continue hydralazine as needed.    Mobility: Encourage ambulation.  PT eval ordered. Code Status:   Code Status: Full Code  Nutritional status: Body mass index is 35.94 kg/m.     Diet Order            Diet Heart Room service appropriate? No; Fluid  consistency: Thin  Diet effective now                 DVT prophylaxis: Lovenox subcu   Antimicrobials:  IV Rocephin, IV daptomycin Fluid: None Consultants: Orthopedics Family Communication:  None at bedside.  Status is: Inpatient  Remains inpatient appropriate because -continues to have cellulitis  Dispo: The patient is from: Home              Anticipated d/c is to: Home hopefully              Anticipated d/c date is: 3 days              Patient currently is not medically stable to d/c.   Difficult to place patient No       Infusions:  . cefTRIAXone (ROCEPHIN)  IV 2 g (10/08/20 2107)  . DAPTOmycin (CUBICIN)  IV 700 mg (10/08/20 2030)    Scheduled Meds: . vitamin C  500 mg Oral Daily  . aspirin  81 mg Oral Daily  . enoxaparin (LOVENOX) injection  60 mg Subcutaneous Q24H  . mouth rinse  15 mL Mouth Rinse BID  . sodium chloride  1 g Oral BID WC  . zinc sulfate  220 mg Oral Daily    Antimicrobials: Anti-infectives (From admission, onward)   Start     Dose/Rate Route Frequency Ordered Stop   10/06/20 2200  cefTRIAXone (ROCEPHIN) 2 g in sodium chloride 0.9 % 100 mL IVPB        2 g 200 mL/hr over 30 Minutes Intravenous Every 24 hours 10/06/20 1507     10/06/20 2000  DAPTOmycin (CUBICIN) 700 mg in sodium chloride 0.9 % IVPB        700 mg 228 mL/hr over 30 Minutes Intravenous Daily 10/06/20 1507     10/06/20 1800  vancomycin (VANCOREADY) IVPB 1500 mg/300 mL  Status:  Discontinued        1,500 mg 150 mL/hr over 120 Minutes Intravenous Every 24 hours 10/05/20 1430 10/06/20 1507   10/06/20 1000  remdesivir 100 mg in sodium chloride 0.9 % 100 mL IVPB  Status:  Discontinued        100 mg 200 mL/hr over 30 Minutes Intravenous Daily 10/05/20 1730 10/05/20 1932   10/06/20 1000  remdesivir 100 mg in sodium chloride 0.9 % 100 mL IVPB        100 mg 200 mL/hr over 30 Minutes Intravenous Daily 10/05/20 1932 10/07/20 0939   10/05/20 2200  ceFEPIme (MAXIPIME) 2 g in sodium  chloride 0.9 % 100 mL IVPB  Status:  Discontinued        2 g 200 mL/hr over 30 Minutes Intravenous Every 8 hours 10/05/20 1430 10/06/20 1507   10/05/20 1800  remdesivir 100 mg  in sodium chloride 0.9 % 100 mL IVPB        100 mg 200 mL/hr over 30 Minutes Intravenous Every 30 min 10/05/20 1730 10/05/20 1904   10/05/20 1315  vancomycin (VANCOCIN) IVPB 1000 mg/200 mL premix       "And" Linked Group Details   1,000 mg 200 mL/hr over 60 Minutes Intravenous  Once 10/05/20 1300 10/05/20 1756   10/05/20 1315  vancomycin (VANCOCIN) IVPB 1000 mg/200 mL premix       "And" Linked Group Details   1,000 mg 200 mL/hr over 60 Minutes Intravenous  Once 10/05/20 1300 10/05/20 1707   10/05/20 1300  ceFEPIme (MAXIPIME) 2 g in sodium chloride 0.9 % 100 mL IVPB        2 g 200 mL/hr over 30 Minutes Intravenous  Once 10/05/20 1250 10/05/20 1427   10/05/20 1300  metroNIDAZOLE (FLAGYL) IVPB 500 mg        500 mg 100 mL/hr over 60 Minutes Intravenous  Once 10/05/20 1250 10/05/20 1542   10/05/20 1300  vancomycin (VANCOCIN) IVPB 1000 mg/200 mL premix  Status:  Discontinued        1,000 mg 200 mL/hr over 60 Minutes Intravenous  Once 10/05/20 1250 10/05/20 1300      PRN meds: acetaminophen **OR** acetaminophen, hydrALAZINE, HYDROcodone-acetaminophen, HYDROmorphone (DILAUDID) injection, ondansetron (ZOFRAN) IV, ondansetron   Objective: Vitals:   10/09/20 0602 10/09/20 1253  BP: 123/70 (!) 144/80  Pulse: 63 71  Resp: 16 16  Temp: 97.8 F (36.6 C) 98 F (36.7 C)  SpO2: 98% 94%    Intake/Output Summary (Last 24 hours) at 10/09/2020 1603 Last data filed at 10/09/2020 1321 Gross per 24 hour  Intake -  Output 1120 ml  Net -1120 ml   Filed Weights   10/05/20 1203 10/05/20 1430 10/05/20 1930  Weight: 107 kg 107 kg 116.9 kg   Weight change:  Body mass index is 35.94 kg/m.   Physical Exam: General exam: Pleasant middle-aged Caucasian male.  Not in distress Skin: No rashes, lesions or ulcers. HEENT:  Atraumatic, normocephalic, no obvious bleeding Lungs: Clear to auscultation bilaterally CVS: Regular rate and rhythm, no murmur GI/Abd soft, nontender, nondistended, bowel present CNS: Alert, awake, oriented x3 Psychiatry: Mood appropriate Extremities: Right leg cellulitis has a bandage on with mild soakage..  Data Review: I have personally reviewed the laboratory data and studies available.  Recent Labs  Lab 10/05/20 1249 10/05/20 2118 10/06/20 0106 10/07/20 0525 10/08/20 0026 10/09/20 0347  WBC 10.8* 9.4 9.9 10.7* 14.5* 23.3*  NEUTROABS 9.2*  --  7.3 8.9* 12.7* 19.4*  HGB 12.0* 10.8* 11.7* 11.5* 11.4* 11.0*  HCT 35.0* 31.7* 34.7* 35.1* 34.4* 33.2*  MCV 85.2 86.4 87.6 89.5 87.5 86.9  PLT 211 195 179 199 250 296   Recent Labs  Lab 10/05/20 2118 10/06/20 0106 10/07/20 0525 10/08/20 0026 10/09/20 0347  NA 126* 125* 127*  125* 125* 132*  K 3.5 3.4* 3.5 4.1 4.2  CL 94* 93* 94* 91* 98  CO2 21* 23 19* 23 25  GLUCOSE 103* 132* 91 182* 189*  BUN 12 14 16 20  24*  CREATININE 1.18 1.51* 1.11 0.98 1.04  CALCIUM 7.4* 7.5* 7.6* 7.8* 7.9*    F/u labs ordered  Signed, Terrilee Croak, MD [facility] 10/09/2020

## 2020-10-09 NOTE — Plan of Care (Signed)

## 2020-10-09 NOTE — Plan of Care (Signed)

## 2020-10-10 ENCOUNTER — Other Ambulatory Visit: Payer: Self-pay | Admitting: Physician Assistant

## 2020-10-10 DIAGNOSIS — U071 COVID-19: Secondary | ICD-10-CM

## 2020-10-10 DIAGNOSIS — L03115 Cellulitis of right lower limb: Secondary | ICD-10-CM | POA: Diagnosis not present

## 2020-10-10 DIAGNOSIS — L02415 Cutaneous abscess of right lower limb: Secondary | ICD-10-CM

## 2020-10-10 LAB — CBC WITH DIFFERENTIAL/PLATELET
Abs Immature Granulocytes: 0.94 10*3/uL — ABNORMAL HIGH (ref 0.00–0.07)
Basophils Absolute: 0.1 10*3/uL (ref 0.0–0.1)
Basophils Relative: 1 %
Eosinophils Absolute: 0 10*3/uL (ref 0.0–0.5)
Eosinophils Relative: 0 %
HCT: 33.9 % — ABNORMAL LOW (ref 39.0–52.0)
Hemoglobin: 11.1 g/dL — ABNORMAL LOW (ref 13.0–17.0)
Immature Granulocytes: 5 %
Lymphocytes Relative: 13 %
Lymphs Abs: 2.3 10*3/uL (ref 0.7–4.0)
MCH: 29.1 pg (ref 26.0–34.0)
MCHC: 32.7 g/dL (ref 30.0–36.0)
MCV: 88.7 fL (ref 80.0–100.0)
Monocytes Absolute: 1.7 10*3/uL — ABNORMAL HIGH (ref 0.1–1.0)
Monocytes Relative: 9 %
Neutro Abs: 13.3 10*3/uL — ABNORMAL HIGH (ref 1.7–7.7)
Neutrophils Relative %: 72 %
Platelets: 351 10*3/uL (ref 150–400)
RBC: 3.82 MIL/uL — ABNORMAL LOW (ref 4.22–5.81)
RDW: 14.8 % (ref 11.5–15.5)
WBC: 18.4 10*3/uL — ABNORMAL HIGH (ref 4.0–10.5)
nRBC: 0 % (ref 0.0–0.2)

## 2020-10-10 LAB — COMPREHENSIVE METABOLIC PANEL
ALT: 43 U/L (ref 0–44)
AST: 21 U/L (ref 15–41)
Albumin: 2.2 g/dL — ABNORMAL LOW (ref 3.5–5.0)
Alkaline Phosphatase: 50 U/L (ref 38–126)
Anion gap: 11 (ref 5–15)
BUN: 22 mg/dL (ref 8–23)
CO2: 26 mmol/L (ref 22–32)
Calcium: 8.3 mg/dL — ABNORMAL LOW (ref 8.9–10.3)
Chloride: 99 mmol/L (ref 98–111)
Creatinine, Ser: 0.94 mg/dL (ref 0.61–1.24)
GFR, Estimated: >60 mL/min (ref >60)
Glucose, Bld: 127 mg/dL — ABNORMAL HIGH (ref 70–99)
Potassium: 3.9 mmol/L (ref 3.5–5.1)
Sodium: 136 mmol/L (ref 135–145)
Total Bilirubin: 0.3 mg/dL (ref 0.3–1.2)
Total Protein: 5.8 g/dL — ABNORMAL LOW (ref 6.5–8.1)

## 2020-10-10 LAB — CULTURE, BLOOD (ROUTINE X 2): Culture: NO GROWTH

## 2020-10-10 LAB — C-REACTIVE PROTEIN: CRP: 5.2 mg/dL — ABNORMAL HIGH (ref <1.0)

## 2020-10-10 LAB — D-DIMER, QUANTITATIVE: D-Dimer, Quant: 0.99 ug/mL-FEU — ABNORMAL HIGH (ref 0.00–0.50)

## 2020-10-10 MED ORDER — GUAIFENESIN-DM 100-10 MG/5ML PO SYRP: 5.0000 mL | ORAL_SOLUTION | Freq: Four times a day (QID) | ORAL | Status: DC | PRN

## 2020-10-10 MED ORDER — BENZONATATE 100 MG PO CAPS
200.0000 mg | ORAL_CAPSULE | Freq: Three times a day (TID) | ORAL | Status: DC
  Administered 2020-10-10 – 2020-10-20 (×28): 200 mg via ORAL
  Filled 2020-10-10 (×28): qty 2

## 2020-10-10 MED ORDER — CEFAZOLIN SODIUM-DEXTROSE 2-4 GM/100ML-% IV SOLN: 2.0000 g | INTRAVENOUS | Status: DC

## 2020-10-10 NOTE — Progress Notes (Signed)
Report given to [facility] RN. Patient going to [id number].

## 2020-10-10 NOTE — Progress Notes (Signed)
Report given to care link Roderic Palau. Patient headed to  [facility] cone via Biomedical scientist. Given IV dilaudid for pain.

## 2020-10-10 NOTE — Plan of Care (Signed)
Alert and oriented x 4. RR even and unlabored. On room air. Moving all extremities. Right lower extremity wrapped with gauze. Bed low and locked. No other needs identified. Will continue to monitor.  Problem: Education: Goal: Knowledge of General Education information will improve Description: Including pain rating scale, medication(s)/side effects and non-pharmacologic comfort measures Outcome: Progressing   Problem: Health Behavior/Discharge Planning: Goal: Ability to manage health-related needs will improve Outcome: Progressing   Problem: Clinical Measurements: Goal: Ability to maintain clinical measurements within normal limits will improve Outcome: Progressing Goal: Will remain free from infection Outcome: Progressing Goal: Diagnostic test results will improve Outcome: Progressing Goal: Respiratory complications will improve Outcome: Progressing Goal: Cardiovascular complication will be avoided Outcome: Progressing   Problem: Activity: Goal: Risk for activity intolerance will decrease Outcome: Progressing   Problem: Nutrition: Goal: Adequate nutrition will be maintained Outcome: Progressing   Problem: Coping: Goal: Level of anxiety will decrease Outcome: Progressing   Problem: Elimination: Goal: Will not experience complications related to bowel motility Outcome: Progressing Goal: Will not experience complications related to urinary retention Outcome: Progressing   Problem: Pain Managment: Goal: General experience of comfort will improve Outcome: Progressing   Problem: Safety: Goal: Ability to remain free from injury will improve Outcome: Progressing   Problem: Skin Integrity: Goal: Risk for impaired skin integrity will decrease Outcome: Progressing   Problem: Education: Goal: Knowledge of risk factors and measures for prevention of condition will improve Outcome: Progressing   Problem: Coping: Goal: Psychosocial and spiritual needs will be  supported Outcome: Progressing   Problem: Respiratory: Goal: Will maintain a patent airway Outcome: Progressing Goal: Complications related to the disease process, condition or treatment will be avoided or minimized Outcome: Progressing

## 2020-10-10 NOTE — H&P (View-Only) (Signed)
ORTHOPAEDIC CONSULTATION  REQUESTING PHYSICIAN: Terrilee Croak, MD  Chief Complaint: Right leg pain and swelling and drainage.  HPI: Andres [name] is a 65 y.o. male who presents with infection of an abscess right lower extremity from blunt trauma.  Patient states that he was at work struck his leg on a compressed air cylinder was started on oral antibiotics and started peroxide dressing changes.  Patient states he has had progressive swelling progressive redness pain drainage and is seen for evaluation for the right lower extremity.  Past Medical History:  Diagnosis Date  . Basal cell carcinoma   . Hypertension    History reviewed. No pertinent surgical history. Social History   Socioeconomic History  . Marital status: Married    Spouse name: Not on file  . Number of children: Not on file  . Years of education: Not on file  . Highest education level: Not on file  Occupational History  . Not on file  Tobacco Use  . Smoking status: Former Research scientist (life sciences)  . Smokeless tobacco: Never Used  Vaping Use  . Vaping Use: Never used  Substance and Sexual Activity  . Alcohol use: Not Currently  . Drug use: Never  . Sexual activity: Yes  Other Topics Concern  . Not on file  Social History Narrative  . Not on file   Social Determinants of Health   Financial Resource Strain: Not on file  Food Insecurity: Not on file  Transportation Needs: Not on file  Physical Activity: Not on file  Stress: Not on file  Social Connections: Not on file   Family History  Family history unknown: Yes   - negative except otherwise stated in the family history section Allergies  Allergen Reactions  . Niacin And Related Shortness Of Breath   Prior to Admission medications   Medication Sig Start Date End Date Taking? Authorizing Provider  amoxicillin-clavulanate (AUGMENTIN) 875-125 MG tablet Take 1 tablet by mouth 2 (two) times daily. 10/03/20  Yes [provider]  Ascorbic Acid (VITAMIN C)  500 MG CHEW Chew 1 tablet by mouth daily.   Yes [provider]  aspirin 81 MG chewable tablet Chew 1 tablet (81 mg total) by mouth daily. 11/10/18  Yes Eugenie Filler, MD  B Complex Vitamins (B-COMPLEX/B-12) TABS Take 1 tablet by mouth daily.   Yes [provider]  losartan (COZAAR) 100 MG tablet TAKE 1 TABLET BY MOUTH EVERY DAY Patient taking differently: Take 100 mg by mouth daily. 07/12/20  Yes Revankar, Reita Cliche, MD  Omega-3 Fatty Acids (FISH OIL) 1000 MG CAPS Take 1 capsule by mouth daily.   Yes [provider]   No results found. - pertinent xrays, CT, MRI studies were reviewed and independently interpreted  Positive ROS: All other systems have been reviewed and were otherwise negative with the exception of those mentioned in the HPI and as above.  Physical Exam: General: Alert, no acute distress Psychiatric: Patient is competent for consent with normal mood and affect Lymphatic: No axillary or cervical lymphadenopathy Cardiovascular: No pedal edema Respiratory: No cyanosis, no use of accessory musculature GI: No organomegaly, abdomen is soft and non-tender    Images:  @ENCIMAGES @  Labs:  Lab Results  Component Value Date   HGBA1C 5.9 (H) 11/08/2018   CRP 5.2 (H) 10/10/2020   CRP 10.4 (H) 10/09/2020   CRP 25.3 (H) 10/08/2020   REPTSTATUS PENDING 10/09/2020   CULT  10/09/2020    NO GROWTH < 24 HOURS Performed at [facility]  Hospital Lab, [place] [address], [place], [place] [address]     Lab Results  Component Value Date   ALBUMIN 2.2 (L) 10/10/2020   ALBUMIN 2.1 (L) 10/09/2020   ALBUMIN 2.4 (L) 10/08/2020    Neurologic: Patient does not have protective sensation bilateral lower extremities.   MUSCULOSKELETAL:   Skin: Examination patient has cellulitis involving the entire calf the medial and lateral aspect of the calf are extremely tender to palpation there is a necrotic ulcer over the anterior compartment there is blistering involving  the entire leg.  Patient has a palpable dorsalis pedis pulse he does have protein caloric malnutrition with a albumin of 2.2.  Patient denies a history of diabetes however his hemoglobin A1c is 5.9.  His C-reactive protein has improved from 10-5 over the past day with IV antibiotics.  Patient has no pain with active or passive range of motion of the ankle.  Assessment: Assessment: Deep abscess versus necrotizing fasciitis right calf.  Plan: Plan: Patient will need to be transferred to [facility] for debridement with fasciotomies of all 4 compartments and application of an installation wound VAC plan to return the operating room on Friday for repeat debridement and possible wound closure possible skin graft.  Will obtain tissue cultures at surgery.  Thank you for the consult and the opportunity to see Mr. Andres Baars, MD [facility] [phone] 1:03 PM

## 2020-10-10 NOTE — Evaluation (Signed)
Physical Therapy Evaluation Patient Details Name: Andres [name] MRN: [id number] DOB: 12/24/1955 Today's Date: 10/10/2020   History of Present Illness  65 y.o. male presented to the ED on 10/05/2020 with worsening right lower extremity cellulitis, and upper respiratory tract symptoms. Imaging showed  myositis R anterior tibialis and medial soleus. Pt is also covid positive. Pt to transfer to [facility] for RLE fasciectomy in 4 compartments tomorrow. Per ortho, dx of deep abscess vs necrotizing faciitis.  Clinical Impression  Pt admitted with above diagnosis. Pt performed supine to sitting at edge of bed without assistance. In sitting he reported 10/10 unbearable RLE pain so returned to supine. RN aware of pt request for pain medication. Will plan to re-assess after surgery tomorrow. Pt currently with functional limitations due to the deficits listed below (see PT Problem List). Pt will benefit from skilled PT to increase their independence and safety with mobility to allow discharge to the venue listed below.       Follow Up Recommendations Supervision for mobility/OOB;Home health PT    Equipment Recommendations  Rolling walker with 5" wheels;3in1 (PT)    Recommendations for Other Services       Precautions / Restrictions Precautions Precautions: None Restrictions Weight Bearing Restrictions: No      Mobility  Bed Mobility Overal bed mobility: Modified Independent             General bed mobility comments: HOB up, used rail. Supine to sit, pt reported RLE pain was unbearable so returned to supine. RN aware of pt request for pain medication.    Transfers                    Ambulation/Gait                Stairs            Wheelchair Mobility    Modified Rankin (Stroke Patients Only)       Balance Overall balance assessment: Independent                                           Pertinent Vitals/Pain Pain Assessment: 0-10 Pain  Score: 10-Worst pain ever Pain Location: RLE with movement Pain Descriptors / Indicators: Grimacing;Guarding;Moaning Pain Intervention(s): Limited activity within patient's tolerance;Monitored during session;Patient requesting pain meds-RN notified;Repositioned    Home Living Family/patient expects to be discharged to:: Private residence Living Arrangements: Spouse/significant other Available Help at Discharge: Family;Available 24 hours/day Type of Home: House Home Access: Stairs to enter Entrance Stairs-Rails: Psychiatric nurse of Steps: 4 Home Layout: One level Home Equipment: Cane - single point      Prior Function Level of Independence: Independent         Comments: just started using SPC with onset of RLE pain     Hand Dominance        Extremity/Trunk Assessment   Upper Extremity Assessment Upper Extremity Assessment: Overall WFL for tasks assessed    Lower Extremity Assessment Lower Extremity Assessment: RLE deficits/detail RLE: Unable to fully assess due to pain RLE Sensation: WNL RLE Coordination: WNL    Cervical / Trunk Assessment Cervical / Trunk Assessment: Normal  Communication   Communication: No difficulties  Cognition Arousal/Alertness: Awake/alert Behavior During Therapy: WFL for tasks assessed/performed Overall Cognitive Status: Within Functional Limits for tasks assessed  General Comments      Exercises     Assessment/Plan    PT Assessment Patient needs continued PT services  PT Problem List Decreased mobility;Decreased range of motion;Decreased activity tolerance;Pain;Decreased knowledge of use of DME       PT Treatment Interventions DME instruction;Gait training;Stair training;Therapeutic exercise;Therapeutic activities;Patient/family education;Balance training    PT Goals (Current goals can be found in the Care Plan section)  Acute Rehab PT Goals Patient  Stated Goal: return to independence with mobilitly PT Goal Formulation: With patient Time For Goal Achievement: 10/24/20 Potential to Achieve Goals: Good    Frequency Min 3X/week   Barriers to discharge        Co-evaluation               AM-PAC PT "6 Clicks" Mobility  Outcome Measure Help needed turning from your back to your side while in a flat bed without using bedrails?: None Help needed moving from lying on your back to sitting on the side of a flat bed without using bedrails?: A Little Help needed moving to and from a bed to a chair (including a wheelchair)?: A Lot Help needed standing up from a chair using your arms (e.g., wheelchair or bedside chair)?: A Lot Help needed to walk in hospital room?: Total Help needed climbing 3-5 steps with a railing? : Total 6 Click Score: 13    End of Session   Activity Tolerance: Patient limited by pain Patient left: in bed;with call bell/phone within reach;with bed alarm set Nurse Communication: Mobility status;Patient requests pain meds PT Visit Diagnosis: Difficulty in walking, not elsewhere classified (R26.2);Pain Pain - Right/Left: Right Pain - part of body: Leg    Time: 5885-0277 PT Time Calculation (min) (ACUTE ONLY): 13 min   Charges:   PT Evaluation $PT Eval Low Complexity: 1 Low        Blondell Reveal Kistler PT 10/10/2020  Acute Rehabilitation Services Pager [phone] Office [phone]

## 2020-10-10 NOTE — Progress Notes (Signed)
PROGRESS NOTE  Andres [name]  DOB: December 06, 1955  PCP: Mackie Pai, PA-C IHK:[id number]  DOA: 10/05/2020  LOS: 5 days   Chief Complaint  Patient presents with  . Leg Pain    Brief narrative: Andres [name] a 65 y.o.malewithhistory of hypertension.   Patient presented to the ED on 10/05/2020 with worsening right lower extremity cellulitis.  He had a small skin injury on the right lower extremity while at work about a week ago. About 2 days later patient's leg started having increasing swelling and redness.  He was tried on oral antibiotics despite which cellulitis worsened and hence he presented to the ED.   Of note also he mentioned upper respiratory tract symptoms for the last few days.    In the ED, patient was Covid positive, required 2 L oxygen by nasal cannula. MRI of right leg showed diffuse and fairly marked subcutaneous soft tissue swelling/edema/fluid consistent with severe cellulitis.  No evidence of abscess.  It also showed myositis involving the anterior tibialis muscle and medial soleus muscle.  No evidence of pyomyositis or osteomyelitis. Patient was admitted to hospitalist service and started on IV antibiotics.  Subjective: Patient was seen and examined this morning. Pleasant middle-aged Caucasian male.  Lying on bed.  Not in distress.  Feels better than at presentation.  Not on supplemental oxygen this morning. He feels his cellulitis is improving.  Has a bandage on.  Assessment/Plan: Cellulitis of the right lower extremity Worsening leukocytosis -Afebrile, hemodynamically stable -WBC count was elevated probably because of steroids.  Currently off steroids and WBC seems to be trending down.   -Currently on IV Rocephin and IV daptomycin. -Continue to monitor. -Ultrasound negative for DVT. Recent Labs  Lab 10/05/20 1309 10/05/20 2118 10/06/20 0106 10/07/20 0525 10/08/20 0026 10/09/20 0347 10/10/20 0427  WBC  --    < > 9.9 10.7* 14.5* 23.3* 18.4*   LATICACIDVEN 1.3  --   --   --   --   --   --    < > = values in this interval not displayed.   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 -Orthopedics consulted.  Blood culture positive for Corynebacterium pseudodiphtheriae -Probably contaminant.  No growth on repeat blood culture.  COVID-19 infection -Cough as the primary symptom.  Not short of breath at rest.  Not on supplemental oxygen.   -Chest x-ray on admission was normal.  CRP improving. -Not on remdesivir.  I will stop steroids today.  I will schedule Tessalon Perles. -Monitor respiratory status. Recent Labs  Lab  0000 10/05/20 1309 10/05/20 1310 10/05/20 2118 10/06/20 0106 10/07/20 0525 10/08/20 0026 10/09/20 0347 10/10/20 0427  SARSCOV2NAA  --  POSITIVE*  --   --   --   --   --   --   --   WBC  --   --   --    < > 9.9 10.7* 14.5* 23.3* 18.4*  LATICACIDVEN  --  1.3  --   --   --   --   --   --   --   DDIMER  --   --   --    < > 0.99* 1.31* 1.58* 0.69* 0.99*  FERRITIN  --   --  179  --   --   --   --   --   --   CRP   < >  --  26.4*  --  26.7* 28.0* 25.3* 10.4* 5.2*  ALT  --   --   --   --  27 45* 43 38 43   < > = values in this interval not displayed.   Hyponatremia  -Probably because of infection itself.  Improving sodium level now. -Not on IV fluids. Recent Labs  Lab 10/05/20 1249 10/05/20 2118 10/06/20 0106 10/07/20 0525 10/08/20 0026 10/09/20 0347 10/10/20 0427  NA 123* 126* 125* 127*  125* 125* 132* 136   AKI -Creatinine peaked at 1.51, subsequently improved. Recent Labs    05/01/20 0807 10/05/20 1249 10/05/20 2118 10/06/20 0106 10/07/20 0525 10/08/20 0026 10/09/20 0347 10/10/20 0427  BUN 14 15 12 14 16 20  24* 22  CREATININE 1.00 1.20 1.18 1.51* 1.11 0.98 1.04 0.94   Essential hypertension -Currently blood pressure in normal range without meds. Continue hydralazine as needed.     Mobility: Encourage ambulation.   PT eval ordered. Code Status:   Code Status: Full Code  Nutritional status: Body mass index is 35.94 kg/m.     Diet Order            Diet Heart Room service appropriate? No; Fluid consistency: Thin  Diet effective now                 DVT prophylaxis: Lovenox subcu   Antimicrobials:  IV Rocephin, IV daptomycin Fluid: None Consultants: Orthopedics pending Family Communication:  None at bedside.  Status is: Inpatient  Remains inpatient appropriate because -continues to require IV antibiotics for cellulitis.  Pending orthopedic consultation  Dispo: The patient is from: Home              Anticipated d/c is to: Home hopefully              Anticipated d/c date is: Depends on orthopedic input.              Patient currently is not medically stable to d/c.   Difficult to place patient No   Infusions:  . cefTRIAXone (ROCEPHIN)  IV 2 g (10/09/20 2210)  . DAPTOmycin (CUBICIN)  IV 700 mg (10/09/20 2132)    Scheduled Meds: . vitamin C  500 mg Oral Daily  . aspirin  81 mg Oral Daily  . benzonatate  200 mg Oral TID  . enoxaparin (LOVENOX) injection  60 mg Subcutaneous Q24H  . mouth rinse  15 mL Mouth Rinse BID  . sodium chloride  1 g Oral BID WC  . zinc sulfate  220 mg Oral Daily    Antimicrobials: Anti-infectives (From admission, onward)   Start     Dose/Rate Route Frequency Ordered Stop   10/06/20 2200  cefTRIAXone (ROCEPHIN) 2 g in sodium chloride 0.9 % 100 mL IVPB        2 g 200 mL/hr over 30 Minutes Intravenous Every 24 hours 10/06/20 1507     10/06/20 2000  DAPTOmycin (CUBICIN) 700 mg in sodium chloride 0.9 % IVPB        700 mg 228 mL/hr over 30 Minutes Intravenous Daily 10/06/20 1507     10/06/20 1800  vancomycin (VANCOREADY) IVPB 1500 mg/300 mL  Status:  Discontinued        1,500 mg 150 mL/hr over 120 Minutes Intravenous Every 24 hours 10/05/20 1430 10/06/20 1507   10/06/20 1000  remdesivir 100 mg in sodium chloride 0.9 % 100 mL IVPB  Status:  Discontinued         100 mg 200 mL/hr over 30 Minutes Intravenous Daily 10/05/20 1730 10/05/20 1932  10/06/20 1000  remdesivir 100 mg in sodium chloride 0.9 % 100 mL IVPB        100 mg 200 mL/hr over 30 Minutes Intravenous Daily 10/05/20 1932 10/07/20 0939   10/05/20 2200  ceFEPIme (MAXIPIME) 2 g in sodium chloride 0.9 % 100 mL IVPB  Status:  Discontinued        2 g 200 mL/hr over 30 Minutes Intravenous Every 8 hours 10/05/20 1430 10/06/20 1507   10/05/20 1800  remdesivir 100 mg in sodium chloride 0.9 % 100 mL IVPB        100 mg 200 mL/hr over 30 Minutes Intravenous Every 30 min 10/05/20 1730 10/05/20 1904   10/05/20 1315  vancomycin (VANCOCIN) IVPB 1000 mg/200 mL premix       "And" Linked Group Details   1,000 mg 200 mL/hr over 60 Minutes Intravenous  Once 10/05/20 1300 10/05/20 1756   10/05/20 1315  vancomycin (VANCOCIN) IVPB 1000 mg/200 mL premix       "And" Linked Group Details   1,000 mg 200 mL/hr over 60 Minutes Intravenous  Once 10/05/20 1300 10/05/20 1707   10/05/20 1300  ceFEPIme (MAXIPIME) 2 g in sodium chloride 0.9 % 100 mL IVPB        2 g 200 mL/hr over 30 Minutes Intravenous  Once 10/05/20 1250 10/05/20 1427   10/05/20 1300  metroNIDAZOLE (FLAGYL) IVPB 500 mg        500 mg 100 mL/hr over 60 Minutes Intravenous  Once 10/05/20 1250 10/05/20 1542   10/05/20 1300  vancomycin (VANCOCIN) IVPB 1000 mg/200 mL premix  Status:  Discontinued        1,000 mg 200 mL/hr over 60 Minutes Intravenous  Once 10/05/20 1250 10/05/20 1300      PRN meds: acetaminophen **OR** acetaminophen, guaiFENesin-dextromethorphan, hydrALAZINE, HYDROcodone-acetaminophen, HYDROmorphone (DILAUDID) injection, ondansetron (ZOFRAN) IV, ondansetron   Objective: Vitals:   10/09/20 2200 10/10/20 0552  BP: (!) 153/78 (!) 146/81  Pulse: 60 62  Resp: 18 20  Temp: 98 F (36.7 C) 98.1 F (36.7 C)  SpO2: 97% 97%    Intake/Output Summary (Last 24 hours) at 10/10/2020 1011 Last data filed at 10/10/2020 0554 Gross per 24 hour   Intake 740 ml  Output 2220 ml  Net -1480 ml   Filed Weights   10/05/20 1203 10/05/20 1430 10/05/20 1930  Weight: 107 kg 107 kg 116.9 kg   Weight change:  Body mass index is 35.94 kg/m.   Physical Exam: General exam: Pleasant middle-aged Caucasian male.  Not in distress Skin: No rashes, lesions or ulcers. HEENT: Atraumatic, normocephalic, no obvious bleeding Lungs: Clear to auscultation bilaterally.  Coughs on deep breathing. CVS: Regular rate and rhythm, no murmur GI/Abd soft, nontender, nondistended, bowel present CNS: Alert, awake, oriented x3 Psychiatry: Mood appropriate Extremities: Right leg cellulitis has a bandage on, no soakage noted today.  Data Review: I have personally reviewed the laboratory data and studies available.  Recent Labs  Lab 10/06/20 0106 10/07/20 0525 10/08/20 0026 10/09/20 0347 10/10/20 0427  WBC 9.9 10.7* 14.5* 23.3* 18.4*  NEUTROABS 7.3 8.9* 12.7* 19.4* 13.3*  HGB 11.7* 11.5* 11.4* 11.0* 11.1*  HCT 34.7* 35.1* 34.4* 33.2* 33.9*  MCV 87.6 89.5 87.5 86.9 88.7  PLT 179 199 250 296 351   Recent Labs  Lab 10/06/20 0106 10/07/20 0525 10/08/20 0026 10/09/20 0347 10/10/20 0427  NA 125* 127*  125* 125* 132* 136  K 3.4* 3.5 4.1 4.2 3.9  CL 93* 94* 91* 98 99  CO2  23 19* 23 25 26   GLUCOSE 132* 91 182* 189* 127*  BUN 14 16 20  24* 22  CREATININE 1.51* 1.11 0.98 1.04 0.94  CALCIUM 7.5* 7.6* 7.8* 7.9* 8.3*    F/u labs ordered  Signed, Terrilee Croak, MD [facility] 10/10/2020

## 2020-10-10 NOTE — Progress Notes (Signed)
Attempted report x 1. Per nurse he would give me a call back.

## 2020-10-10 NOTE — Consult Note (Signed)
ORTHOPAEDIC CONSULTATION  REQUESTING PHYSICIAN: Terrilee Croak, MD  Chief Complaint: Right leg pain and swelling and drainage.  HPI: Andres [name] is a 65 y.o. male who presents with infection of an abscess right lower extremity from blunt trauma.  Patient states that he was at work struck his leg on a compressed air cylinder was started on oral antibiotics and started peroxide dressing changes.  Patient states he has had progressive swelling progressive redness pain drainage and is seen for evaluation for the right lower extremity.  Past Medical History:  Diagnosis Date  . Basal cell carcinoma   . Hypertension    History reviewed. No pertinent surgical history. Social History   Socioeconomic History  . Marital status: Married    Spouse name: Not on file  . Number of children: Not on file  . Years of education: Not on file  . Highest education level: Not on file  Occupational History  . Not on file  Tobacco Use  . Smoking status: Former Research scientist (life sciences)  . Smokeless tobacco: Never Used  Vaping Use  . Vaping Use: Never used  Substance and Sexual Activity  . Alcohol use: Not Currently  . Drug use: Never  . Sexual activity: Yes  Other Topics Concern  . Not on file  Social History Narrative  . Not on file   Social Determinants of Health   Financial Resource Strain: Not on file  Food Insecurity: Not on file  Transportation Needs: Not on file  Physical Activity: Not on file  Stress: Not on file  Social Connections: Not on file   Family History  Family history unknown: Yes   - negative except otherwise stated in the family history section Allergies  Allergen Reactions  . Niacin And Related Shortness Of Breath   Prior to Admission medications   Medication Sig Start Date End Date Taking? Authorizing Provider  amoxicillin-clavulanate (AUGMENTIN) 875-125 MG tablet Take 1 tablet by mouth 2 (two) times daily. 10/03/20  Yes [provider]  Ascorbic Acid (VITAMIN C)  500 MG CHEW Chew 1 tablet by mouth daily.   Yes [provider]  aspirin 81 MG chewable tablet Chew 1 tablet (81 mg total) by mouth daily. 11/10/18  Yes Eugenie Filler, MD  B Complex Vitamins (B-COMPLEX/B-12) TABS Take 1 tablet by mouth daily.   Yes [provider]  losartan (COZAAR) 100 MG tablet TAKE 1 TABLET BY MOUTH EVERY DAY Patient taking differently: Take 100 mg by mouth daily. 07/12/20  Yes Revankar, Reita Cliche, MD  Omega-3 Fatty Acids (FISH OIL) 1000 MG CAPS Take 1 capsule by mouth daily.   Yes [provider]   No results found. - pertinent xrays, CT, MRI studies were reviewed and independently interpreted  Positive ROS: All other systems have been reviewed and were otherwise negative with the exception of those mentioned in the HPI and as above.  Physical Exam: General: Alert, no acute distress Psychiatric: Patient is competent for consent with normal mood and affect Lymphatic: No axillary or cervical lymphadenopathy Cardiovascular: No pedal edema Respiratory: No cyanosis, no use of accessory musculature GI: No organomegaly, abdomen is soft and non-tender    Images:  @ENCIMAGES @  Labs:  Lab Results  Component Value Date   HGBA1C 5.9 (H) 11/08/2018   CRP 5.2 (H) 10/10/2020   CRP 10.4 (H) 10/09/2020   CRP 25.3 (H) 10/08/2020   REPTSTATUS PENDING 10/09/2020   CULT  10/09/2020    NO GROWTH < 24 HOURS Performed at [facility]  Hospital Lab, [place] [address] [place], [place] [address]     Lab Results  Component Value Date   ALBUMIN 2.2 (L) 10/10/2020   ALBUMIN 2.1 (L) 10/09/2020   ALBUMIN 2.4 (L) 10/08/2020    Neurologic: Patient does not have protective sensation bilateral lower extremities.   MUSCULOSKELETAL:   Skin: Examination patient has cellulitis involving the entire calf the medial and lateral aspect of the calf are extremely tender to palpation there is a necrotic ulcer over the anterior compartment there is blistering involving  the entire leg.  Patient has a palpable dorsalis pedis pulse he does have protein caloric malnutrition with a albumin of 2.2.  Patient denies a history of diabetes however his hemoglobin A1c is 5.9.  His C-reactive protein has improved from 10-5 over the past day with IV antibiotics.  Patient has no pain with active or passive range of motion of the ankle.  Assessment: Assessment: Deep abscess versus necrotizing fasciitis right calf.  Plan: Plan: Patient will need to be transferred to [facility] for debridement with fasciotomies of all 4 compartments and application of an installation wound VAC plan to return the operating room on Friday for repeat debridement and possible wound closure possible skin graft.  Will obtain tissue cultures at surgery.  Thank you for the consult and the opportunity to see [name], MD [facility] [phone] 1:03 PM

## 2020-10-10 NOTE — Progress Notes (Signed)
Called to room for episode of unwitnessed emesis. Offered Zophran per MAR. Patient declined, and endorsed, " he was not nauseas, the vomiting was brought on by a coughing spell." No other needs identified. Will continue to monitor.

## 2020-10-11 ENCOUNTER — Inpatient Hospital Stay (HOSPITAL_COMMUNITY): Payer: Worker's Compensation | Admitting: Certified Registered Nurse Anesthetist

## 2020-10-11 ENCOUNTER — Encounter (HOSPITAL_COMMUNITY)
Admission: EM | Disposition: A | Discharge status: Rehabilitation Facility | Payer: Self-pay | Source: Home / Self Care | Attending: Internal Medicine

## 2020-10-11 DIAGNOSIS — L03115 Cellulitis of right lower limb: Secondary | ICD-10-CM | POA: Diagnosis not present

## 2020-10-11 DIAGNOSIS — U071 COVID-19: Secondary | ICD-10-CM | POA: Diagnosis not present

## 2020-10-11 DIAGNOSIS — L02415 Cutaneous abscess of right lower limb: Secondary | ICD-10-CM | POA: Diagnosis not present

## 2020-10-11 HISTORY — PX: FASCIOTOMY: SHX132

## 2020-10-11 LAB — BASIC METABOLIC PANEL
Anion gap: 12 (ref 5–15)
BUN: 17 mg/dL (ref 8–23)
CO2: 22 mmol/L (ref 22–32)
Calcium: 7.8 mg/dL — ABNORMAL LOW (ref 8.9–10.3)
Chloride: 100 mmol/L (ref 98–111)
Creatinine, Ser: 0.92 mg/dL (ref 0.61–1.24)
GFR, Estimated: >60 mL/min (ref >60)
Glucose, Bld: 83 mg/dL (ref 70–99)
Potassium: 4.3 mmol/L (ref 3.5–5.1)
Sodium: 134 mmol/L — ABNORMAL LOW (ref 135–145)

## 2020-10-11 LAB — CBC WITH DIFFERENTIAL/PLATELET
Abs Immature Granulocytes: 0 10*3/uL (ref 0.00–0.07)
Basophils Absolute: 0 10*3/uL (ref 0.0–0.1)
Basophils Relative: 0 %
Eosinophils Absolute: 0.2 10*3/uL (ref 0.0–0.5)
Eosinophils Relative: 1 %
HCT: 38.1 % — ABNORMAL LOW (ref 39.0–52.0)
Hemoglobin: 12.4 g/dL — ABNORMAL LOW (ref 13.0–17.0)
Lymphocytes Relative: 10 %
Lymphs Abs: 1.8 10*3/uL (ref 0.7–4.0)
MCH: 28.6 pg (ref 26.0–34.0)
MCHC: 32.5 g/dL (ref 30.0–36.0)
MCV: 87.8 fL (ref 80.0–100.0)
Monocytes Absolute: 1.3 10*3/uL — ABNORMAL HIGH (ref 0.1–1.0)
Monocytes Relative: 7 %
Neutro Abs: 14.8 10*3/uL — ABNORMAL HIGH (ref 1.7–7.7)
Neutrophils Relative %: 82 %
Platelets: 398 10*3/uL (ref 150–400)
RBC: 4.34 MIL/uL (ref 4.22–5.81)
RDW: 14.6 % (ref 11.5–15.5)
WBC: 18.1 10*3/uL — ABNORMAL HIGH (ref 4.0–10.5)
nRBC: 0 /100 WBC
nRBC: 0.2 % (ref 0.0–0.2)

## 2020-10-11 LAB — MRSA PCR SCREENING: MRSA by PCR: NEGATIVE

## 2020-10-11 SURGERY — FASCIOTOMY, UPPER EXTREMITY
Anesthesia: General | Site: Leg Lower | Laterality: Right

## 2020-10-11 MED ORDER — HYDROMORPHONE HCL 1 MG/ML IJ SOLN
0.2500 mg | INTRAMUSCULAR | Status: DC | PRN
  Administered 2020-10-11 (×3): 0.5 mg via INTRAVENOUS

## 2020-10-11 MED ORDER — ONDANSETRON HCL 4 MG/2ML IJ SOLN: 4.0000 mg | Freq: Once | INTRAMUSCULAR | Status: DC | PRN

## 2020-10-11 MED ORDER — MIDAZOLAM HCL 2 MG/2ML IJ SOLN
INTRAMUSCULAR | Status: DC | PRN
  Administered 2020-10-11: 2 mg via INTRAVENOUS

## 2020-10-11 MED ORDER — OXYCODONE HCL 5 MG/5ML PO SOLN: 5.0000 mg | Freq: Once | ORAL | Status: DC | PRN

## 2020-10-11 MED ORDER — ONDANSETRON HCL 4 MG/2ML IJ SOLN
INTRAMUSCULAR | Status: DC | PRN
  Administered 2020-10-11: 4 mg via INTRAVENOUS

## 2020-10-11 MED ORDER — LACTATED RINGERS IV SOLN: INTRAVENOUS | Status: DC | PRN

## 2020-10-11 MED ORDER — ENOXAPARIN SODIUM 60 MG/0.6ML ~~LOC~~ SOLN
60.0000 mg | SUBCUTANEOUS | Status: DC
  Administered 2020-10-14 – 2020-10-19 (×6): 60 mg via SUBCUTANEOUS
  Filled 2020-10-11 (×6): qty 0.6

## 2020-10-11 MED ORDER — PHENYLEPHRINE 40 MCG/ML (10ML) SYRINGE FOR IV PUSH (FOR BLOOD PRESSURE SUPPORT)
PREFILLED_SYRINGE | INTRAVENOUS | Status: AC
  Filled 2020-10-11: qty 10

## 2020-10-11 MED ORDER — SODIUM CHLORIDE 0.9 % IR SOLN
Status: DC | PRN
  Administered 2020-10-11: 500 mL

## 2020-10-11 MED ORDER — MIDAZOLAM HCL 2 MG/2ML IJ SOLN
INTRAMUSCULAR | Status: AC
  Filled 2020-10-11: qty 2

## 2020-10-11 MED ORDER — SODIUM CHLORIDE 0.9 % IV SOLN: INTRAVENOUS | Status: DC

## 2020-10-11 MED ORDER — OXYCODONE HCL 5 MG PO TABS: 5.0000 mg | ORAL_TABLET | Freq: Once | ORAL | Status: DC | PRN

## 2020-10-11 MED ORDER — PROPOFOL 10 MG/ML IV BOLUS
INTRAVENOUS | Status: AC
  Filled 2020-10-11: qty 20

## 2020-10-11 MED ORDER — 0.9 % SODIUM CHLORIDE (POUR BTL) OPTIME
TOPICAL | Status: DC | PRN
Start: 2020-10-11 — End: 2020-10-11
  Administered 2020-10-11: 1000 mL

## 2020-10-11 MED ORDER — LIDOCAINE 2% (20 MG/ML) 5 ML SYRINGE
INTRAMUSCULAR | Status: AC
  Filled 2020-10-11: qty 5

## 2020-10-11 MED ORDER — PROPOFOL 10 MG/ML IV BOLUS
INTRAVENOUS | Status: DC | PRN
  Administered 2020-10-11: 120 ug via INTRAVENOUS
  Administered 2020-10-11: 40 ug via INTRAVENOUS

## 2020-10-11 MED ORDER — LIDOCAINE HCL (CARDIAC) PF 100 MG/5ML IV SOSY
PREFILLED_SYRINGE | INTRAVENOUS | Status: DC | PRN
  Administered 2020-10-11: 60 mg via INTRATRACHEAL

## 2020-10-11 MED ORDER — FENTANYL CITRATE (PF) 250 MCG/5ML IJ SOLN
INTRAMUSCULAR | Status: AC
  Filled 2020-10-11: qty 5

## 2020-10-11 MED ORDER — FENTANYL CITRATE (PF) 250 MCG/5ML IJ SOLN
INTRAMUSCULAR | Status: DC | PRN
  Administered 2020-10-11: 50 ug via INTRAVENOUS
  Administered 2020-10-11 (×2): 100 ug via INTRAVENOUS

## 2020-10-11 MED ORDER — DEXAMETHASONE SODIUM PHOSPHATE 10 MG/ML IJ SOLN
INTRAMUSCULAR | Status: DC | PRN
  Administered 2020-10-11: 10 mg via INTRAVENOUS

## 2020-10-11 MED ORDER — HYDROMORPHONE HCL 1 MG/ML IJ SOLN
INTRAMUSCULAR | Status: AC
  Filled 2020-10-11: qty 2

## 2020-10-11 SURGICAL SUPPLY — 53 items
BNDG COHESIVE 4X5 TAN STRL (GAUZE/BANDAGES/DRESSINGS) ×2 IMPLANT
BNDG ELASTIC 4X5.8 VLCR STR LF (GAUZE/BANDAGES/DRESSINGS) ×2 IMPLANT
BNDG ELASTIC 6X5.8 VLCR STR LF (GAUZE/BANDAGES/DRESSINGS) ×2 IMPLANT
BNDG GAUZE ELAST 4 BULKY (GAUZE/BANDAGES/DRESSINGS) ×2 IMPLANT
CANISTER WOUND CARE 500ML ATS (WOUND CARE) IMPLANT
CANISTER WOUNDNEG PRESSURE 500 (CANNISTER) ×1 IMPLANT
COVER SURGICAL LIGHT HANDLE (MISCELLANEOUS) ×4 IMPLANT
COVER WAND RF STERILE (DRAPES) ×2 IMPLANT
CUFF TOURN SGL QUICK 24 (TOURNIQUET CUFF)
CUFF TOURN SGL QUICK 34 (TOURNIQUET CUFF)
CUFF TRNQT CYL 24X4X16.5-23 (TOURNIQUET CUFF) IMPLANT
CUFF TRNQT CYL 34X4.125X (TOURNIQUET CUFF) IMPLANT
DRAPE DERMATAC (DRAPES) ×2 IMPLANT
DRAPE INCISE 23X17 IOBAN STRL (DRAPES) ×1
DRAPE INCISE 23X17 STRL (DRAPES) IMPLANT
DRAPE INCISE IOBAN 23X17 STRL (DRAPES) ×1 IMPLANT
DRAPE INCISE IOBAN 66X45 STRL (DRAPES) ×2 IMPLANT
DRAPE ORTHO SPLIT 77X108 STRL (DRAPES) ×4
DRAPE SURG ORHT 6 SPLT 77X108 (DRAPES) ×2 IMPLANT
DRAPE U-SHAPE 47X51 STRL (DRAPES) ×2 IMPLANT
DRESSING VERAFLO CLEANSE CC (GAUZE/BANDAGES/DRESSINGS) IMPLANT
DRSG ADAPTIC 3X8 NADH LF (GAUZE/BANDAGES/DRESSINGS) ×2 IMPLANT
DRSG PAD ABDOMINAL 8X10 ST (GAUZE/BANDAGES/DRESSINGS) ×2 IMPLANT
DRSG VAC ATS LRG SENSATRAC (GAUZE/BANDAGES/DRESSINGS) IMPLANT
DRSG VAC ATS MED SENSATRAC (GAUZE/BANDAGES/DRESSINGS) IMPLANT
DRSG VAC ATS SM SENSATRAC (GAUZE/BANDAGES/DRESSINGS) IMPLANT
DRSG VERAFLO CLEANSE CC (GAUZE/BANDAGES/DRESSINGS) ×4
DURAPREP 26ML APPLICATOR (WOUND CARE) ×2 IMPLANT
ELECT REM PT RETURN 9FT ADLT (ELECTROSURGICAL) ×2
ELECTRODE REM PT RTRN 9FT ADLT (ELECTROSURGICAL) ×1 IMPLANT
GAUZE SPONGE 4X4 12PLY STRL (GAUZE/BANDAGES/DRESSINGS) ×2 IMPLANT
GLOVE BIOGEL PI IND STRL 9 (GLOVE) ×1 IMPLANT
GLOVE BIOGEL PI INDICATOR 9 (GLOVE) ×1
GLOVE SURG ORTHO 9.0 STRL STRW (GLOVE) ×2 IMPLANT
GOWN STRL REUS W/ TWL XL LVL3 (GOWN DISPOSABLE) ×3 IMPLANT
GOWN STRL REUS W/TWL XL LVL3 (GOWN DISPOSABLE) ×6
KIT BASIN OR (CUSTOM PROCEDURE TRAY) ×2 IMPLANT
KIT TURNOVER KIT B (KITS) ×2 IMPLANT
MANIFOLD NEPTUNE II (INSTRUMENTS) ×2 IMPLANT
NS IRRIG 1000ML POUR BTL (IV SOLUTION) ×2 IMPLANT
PACK GENERAL/GYN (CUSTOM PROCEDURE TRAY) ×2 IMPLANT
PAD ARMBOARD 7.5X6 YLW CONV (MISCELLANEOUS) ×4 IMPLANT
SET INTERPULSE LAVAGE W/TIP (ORTHOPEDIC DISPOSABLE SUPPLIES) ×1 IMPLANT
SET MONITOR QUICK PRESSURE (MISCELLANEOUS) IMPLANT
SPONGE LAP 18X18 RF (DISPOSABLE) ×2 IMPLANT
STAPLER VISISTAT 35W (STAPLE) IMPLANT
STOCKINETTE IMPERVIOUS 9X36 MD (GAUZE/BANDAGES/DRESSINGS) ×2 IMPLANT
SUT VIC AB 0 CT1 27 (SUTURE)
SUT VIC AB 0 CT1 27XBRD ANBCTR (SUTURE) IMPLANT
SUT VIC AB 2-0 CTB1 (SUTURE) IMPLANT
TOWEL GREEN STERILE (TOWEL DISPOSABLE) ×2 IMPLANT
TOWEL GREEN STERILE FF (TOWEL DISPOSABLE) ×2 IMPLANT
WATER STERILE IRR 1000ML POUR (IV SOLUTION) ×2 IMPLANT

## 2020-10-11 NOTE — Progress Notes (Signed)
[facility] PROGRESS NOTE    Progress Note  Andres [name]  FTD:[id number] DOB: 28-Sep-1955 DOA: 10/05/2020 PCP: Mackie Pai, PA-C     Brief Narrative:   Andres [name] is an 65 y.o. male past medical history of hypertension comes into the ED on 10/05/2020 for worsening right lower extremity cellulitis, he had initially been on oral antibiotics but due to worsening presented to the ED MRI showed diffuse fairly marked subcutaneous soft tissue swelling and edema no evidence of abscess, with some myositis in the anterior tibial muscles.  Antibiotic regimen: Rocephin and daptomycin started on 10/06/2020  Assessment/Plan:   Cellulitis of right lower extremity: Has remained afebrile, white blood cell count is elevated likely due to steroids. His white blood cell count is slowly coming down. Last temperature was on 10/10/2021 of 100.5. Continue daptomycin and Rocephin. 2 blood cultures was positive for Corynebacterium diphtheria I likely contaminant.  Myositis of the anterior tibial muscles: Orthopedic surgery was consulted, who recommended transfer to [facility] for debridement and fasciotomy of all 4 compartments and wound VAC placement done on 10/11/2020. Continue empiric antibiotics, further management per orthopedic surgery    Incidental COVID-19 infection: Chest x-ray on admission showed no infiltrates steroids were stopped. Currently satting greater than 94% on room air.  Hypovolemic hyponatremia: Resolved with IV fluid hydration.  Acute kidney injury: With a baseline creatinine of around 1, 9 KVO IV fluids.  Essential hypertension Continue to hold antihypertensive medication, continue hydralazine IV.   DVT prophylaxis: lovenox Family Communication:none Status is: Inpatient  Remains inpatient appropriate because:Hemodynamically unstable   Dispo: The patient is from: Home              Anticipated d/c is to: Home              Anticipated d/c date is: 3 days               Patient currently is not medically stable to d/c.   Difficult to place patient No        Code Status:     Code Status Orders  (From admission, onward)         Start     Ordered   10/05/20 2009  Full code  Continuous        10/05/20 2009        Code Status History    Date Active Date Inactive Code Status Order ID Comments User Context   11/08/2018 0939 11/09/2018 2124 Full Code [id number]  Barb Merino, MD Inpatient   Advance Care Planning Activity        IV Access:    Peripheral IV   Procedures and diagnostic studies:   No results found.   Medical Consultants:    None.   Subjective:    Latravious Levitt still in pain.   Objective:    Vitals:   10/10/20 1506 10/10/20 1958 10/11/20 0400 10/11/20 0927  BP: 140/79 (!) 146/84 137/74 131/75  Pulse: 71 77 87 76  Resp: 16 20 18 18   Temp: 98.7 F (37.1 C) 99.6 F (37.6 C) (!) 100.5 F (38.1 C) 98.2 F (36.8 C)  TempSrc: Oral Oral Oral Oral  SpO2: 94% 97% 90% 91%  Weight:      Height:       SpO2: 91 % O2 Flow Rate (L/min): 2 L/min   Intake/Output Summary (Last 24 hours) at 10/11/2020 1209 Last data filed at 10/11/2020 0900 Gross per 24 hour  Intake 0 ml  Output 1300 ml  Net -1300 ml   Filed Weights   10/05/20 1203 10/05/20 1430 10/05/20 1930  Weight: 107 kg 107 kg 116.9 kg    Exam: General exam: In no acute distress. Respiratory system: Good air movement and clear to auscultation. Cardiovascular system: S1 & S2 heard, RRR. No JVD. Gastrointestinal system: Abdomen is nondistended, soft and nontender.  Extremities: Right lower extremity state attempts has an swollen warm to touch Skin: No rashes, lesions or ulcers Psychiatry: Judgement and insight appear normal. Mood & affect appropriate.    Data Reviewed:    Labs: Basic Metabolic Panel: Recent Labs  Lab 10/07/20 0525 10/08/20 0026 10/09/20 0347 10/10/20 0427 10/11/20 0235  NA 127*  125* 125* 132* 136 134*  K 3.5 4.1 4.2  3.9 4.3  CL 94* 91* 98 99 100  CO2 19* 23 25 26 22   GLUCOSE 91 182* 189* 127* 83  BUN 16 20 24* 22 17  CREATININE 1.11 0.98 1.04 0.94 0.92  CALCIUM 7.6* 7.8* 7.9* 8.3* 7.8*   GFR Estimated Creatinine Clearance: 108.2 mL/min (by C-G formula based on SCr of 0.92 mg/dL). Liver Function Tests: Recent Labs  Lab 10/06/20 0106 10/07/20 0525 10/08/20 0026 10/09/20 0347 10/10/20 0427  AST 19 34 27 22 21   ALT 27 45* 43 38 43  ALKPHOS 45 47 67 59 50  BILITOT 0.7 0.8 0.3 0.5 0.3  PROT 6.2* 6.1* 6.4* 5.5* 5.8*  ALBUMIN 2.6* 2.3* 2.4* 2.1* 2.2*   No results for input(s): LIPASE, AMYLASE in the last 168 hours. No results for input(s): AMMONIA in the last 168 hours. Coagulation profile Recent Labs  Lab 10/05/20 1249  INR 1.1   COVID-19 Labs  Recent Labs    10/09/20 0347 10/10/20 0427  DDIMER 0.69* 0.99*  CRP 10.4* 5.2*    Lab Results  Component Value Date   SARSCOV2NAA POSITIVE (A) 10/05/2020    CBC: Recent Labs  Lab 10/07/20 0525 10/08/20 0026 10/09/20 0347 10/10/20 0427 10/11/20 0235  WBC 10.7* 14.5* 23.3* 18.4* 18.1*  NEUTROABS 8.9* 12.7* 19.4* 13.3* 14.8*  HGB 11.5* 11.4* 11.0* 11.1* 12.4*  HCT 35.1* 34.4* 33.2* 33.9* 38.1*  MCV 89.5 87.5 86.9 88.7 87.8  PLT 199 250 296 351 398   Cardiac Enzymes: Recent Labs  Lab 10/07/20 0525  CKTOTAL 55   BNP (last 3 results) No results for input(s): PROBNP in the last 8760 hours. CBG: No results for input(s): GLUCAP in the last 168 hours. D-Dimer: Recent Labs    10/09/20 0347 10/10/20 0427  DDIMER 0.69* 0.99*   Hgb A1c: No results for input(s): HGBA1C in the last 72 hours. Lipid Profile: No results for input(s): CHOL, HDL, LDLCALC, TRIG, CHOLHDL, LDLDIRECT in the last 72 hours. Thyroid function studies: No results for input(s): TSH, T4TOTAL, T3FREE, THYROIDAB in the last 72 hours.  Invalid input(s): FREET3 Anemia work up: No results for input(s): VITAMINB12, FOLATE, FERRITIN, TIBC, IRON, RETICCTPCT in  the last 72 hours. Sepsis Labs: Recent Labs  Lab 10/05/20 1309 10/05/20 2118 10/08/20 0026 10/09/20 0347 10/10/20 0427 10/11/20 0235  WBC  --    < > 14.5* 23.3* 18.4* 18.1*  LATICACIDVEN 1.3  --   --   --   --   --    < > = values in this interval not displayed.   Microbiology Recent Results (from the past 240 hour(s))  SARS Coronavirus 2 by RT PCR (hospital order, performed in [facility] hospital lab) Nasopharyngeal Peripheral     Status: Abnormal   Collection  Time: 10/05/20  1:09 PM   Specimen: Peripheral; Nasopharyngeal  Result Value Ref Range Status   SARS Coronavirus 2 POSITIVE (A) NEGATIVE Final    Comment: RESULT CALLED TO, READ BACK BY AND VERIFIED WITH: R[place]. C, RN @ 4268 ON 10/05/2020, CABELLERO.P (NOTE) SARS-CoV-2 target nucleic acids are DETECTED  SARS-CoV-2 RNA is generally detectable in upper respiratory specimens  during the acute phase of infection.  Positive results are indicative  of the presence of the identified virus, but do not rule out bacterial infection or co-infection with other pathogens not detected by the test.  Clinical correlation with patient history and  other diagnostic information is necessary to determine patient infection status.  The expected result is negative.  Fact Sheet for Patients:   StrictlyIdeas.no   Fact Sheet for Healthcare Providers:   BankingDealers.co.za    This test is not yet approved or cleared by the [place] FDA and  has been authorized for detection and/or diagnosis of SARS-CoV-2 by FDA under an Emergency Use Authorization (EUA).  This EUA will remain in effec t (meaning this test can be used) for the duration of  the COVID-19 declaration under Section 564(b)(1) of the Act, 21 U.S.C. section 360-bbb-3(b)(1), unless the authorization is terminated or revoked sooner.  Performed at [facility], [address] [place], [place] [address]    Blood Culture (routine x 2)     Status: Abnormal   Collection Time: 10/05/20  1:09 PM   Specimen: BLOOD  Result Value Ref Range Status   Specimen Description   Final    BLOOD LEFT ANTECUBITAL Performed at [facility], [place] [address], [place], [place] [address]    Special Requests   Final    BOTTLES DRAWN AEROBIC AND ANAEROBIC Blood Culture results may not be optimal due to an inadequate volume of blood received in culture bottles Performed at [facility], [address] [place], [place] [address]    Culture  Setup Time   Final    GRAM POSITIVE RODS AEROBIC BOTTLE ONLY CRITICAL RESULT CALLED TO, READ BACK BY AND VERIFIED WITH: PHARMD N Luverne [id number] AT 1530 BY CM    Culture (A)  Final    CORYNEBACTERIUM PSEUDODIPHTHERIAE Standardized susceptibility testing for this organism is not available. Performed at [facility], [place] [address] [place], [place] [address]    Report Status 10/09/2020 FINAL  Final  Blood Culture (routine x 2)     Status: None   Collection Time: 10/05/20  1:10 PM   Specimen: BLOOD RIGHT FOREARM  Result Value Ref Range Status   Specimen Description   Final    BLOOD RIGHT FOREARM Performed at [facility], [address] [place], [place] [address]    Special Requests   Final    BOTTLES DRAWN AEROBIC AND ANAEROBIC Blood Culture results may not be optimal due to an inadequate volume of blood received in culture bottles Performed at [facility], [address] [place], [place] [address]    Culture   Final    NO GROWTH 5 DAYS Performed at [facility], [place] [address] [place],  [address]    Report Status 10/10/2020 FINAL  Final  MRSA PCR Screening     Status: None   Collection Time: 10/05/20 10:35 PM   Specimen: Nasopharyngeal  Result Value Ref Range Status   MRSA by PCR NEGATIVE NEGATIVE Final    Comment:  The GeneXpert MRSA Assay (FDA approved for NASAL specimens only), is one  component of a comprehensive MRSA colonization surveillance program. It is not intended to diagnose MRSA infection nor to guide or monitor treatment for MRSA infections. Performed at [facility], [place] [address], [place], [place] [address]   Culture, blood (routine x 2)     Status: None (Preliminary result)   Collection Time: 10/08/20 12:26 AM   Specimen: BLOOD  Result Value Ref Range Status   Specimen Description   Final    BLOOD RIGHT ANTECUBITAL Performed at [place] [address] [place], [place] [address]    Special Requests   Final    BOTTLES DRAWN AEROBIC ONLY Blood Culture results may not be optimal due to an excessive volume of blood received in culture bottles Performed at [facility] [address], [place], [place] [address]    Culture   Final    NO GROWTH 3 DAYS Performed at [facility], [place] [address] [place], [place] [address]    Report Status PENDING  Incomplete  Culture, blood (routine x 2)     Status: None (Preliminary result)   Collection Time: 10/08/20 12:26 AM   Specimen: BLOOD  Result Value Ref Range Status   Specimen Description   Final    BLOOD RIGHT HAND Performed at [place] [address], [place], [place] [address]    Special Requests   Final    BOTTLES DRAWN AEROBIC ONLY Blood Culture results may not be optimal due to an excessive volume of blood received in culture bottles Performed at [place] [address] [place], [place] [address]    Culture   Final    NO GROWTH 3 DAYS Performed at [facility], [place] [address], [place], [place] [address]    Report Status PENDING  Incomplete  Culture, blood (routine x 2)     Status: None (Preliminary result)   Collection Time: 10/09/20 11:12 AM   Specimen: BLOOD RIGHT HAND  Result Value Ref Range Status   Specimen Description   Final    BLOOD RIGHT HAND Performed at [facility] [address], [place], [place] [address]    Special Requests   Final    BOTTLES DRAWN AEROBIC ONLY Blood Culture adequate volume Performed at [place] [address] [place], [place] [address]    Culture   Final    NO GROWTH 2 DAYS Performed at [place] [address] [place], [place] [address]    Report Status PENDING  Incomplete  Culture, blood (routine x 2)     Status: None (Preliminary result)   Collection Time: 10/09/20 11:19 AM   Specimen: BLOOD  Result Value Ref Range Status   Specimen Description   Final    BLOOD LEFT ANTECUBITAL Performed at [place] [address], [place], [place] [address]    Special Requests   Final    BOTTLES DRAWN AEROBIC AND ANAEROBIC Blood Culture adequate volume Performed at [facility] [address], [place], [place] [address]    Culture   Final    NO GROWTH 2 DAYS Performed at [place] [address] [place], [place] [address]    Report Status PENDING  Incomplete  MRSA PCR Screening     Status: None   Collection Time: 10/10/20  8:29 PM  Specimen: Nasal Mucosa; Nasopharyngeal  Result Value Ref Range Status   MRSA by PCR NEGATIVE NEGATIVE Final    Comment:        The GeneXpert MRSA Assay (FDA approved for NASAL specimens only), is one component of a comprehensive MRSA colonization surveillance program. It is not intended to diagnose MRSA infection nor to guide or monitor treatment for MRSA infections. Performed at [facility], [place] [address] [place], [place] [address]      Medications:   . vitamin C  500 mg Oral Daily  . aspirin  81 mg Oral Daily  . benzonatate  200 mg Oral TID  . enoxaparin (LOVENOX) injection  60 mg Subcutaneous Q24H  . mouth rinse  15 mL Mouth Rinse BID  . sodium chloride  1 g Oral BID WC  . zinc sulfate  220 mg Oral Daily   Continuous Infusions: .  ceFAZolin (ANCEF) IV     . cefTRIAXone (ROCEPHIN)  IV 2 g (10/10/20 2149)  . DAPTOmycin (CUBICIN)  IV 700 mg (10/11/20 0114)      LOS: 6 days   Charlynne Cousins  [facility]  10/11/2020, 12:09 PM

## 2020-10-11 NOTE — Op Note (Signed)
10/11/2020  4:19 PM  PATIENT:  Andres [name]    PRE-OPERATIVE DIAGNOSIS:  Abscess Right Leg  POST-OPERATIVE DIAGNOSIS:  Same  PROCEDURE:  RIGHT LEG FASCIOTOMIES times 4  AND excisional debridement of skin soft tissue muscle and fascia.  SURGEON:  Newt Minion, MD  PHYSICIAN ASSISTANT:None ANESTHESIA:   General  PREOPERATIVE INDICATIONS:  Andres [name] is a  65 y.o. male with a diagnosis of Abscess Right Leg who failed conservative measures and elected for surgical management.    The risks benefits and alternatives were discussed with the patient preoperatively including but not limited to the risks of infection, bleeding, nerve injury, cardiopulmonary complications, the need for revision surgery, among others, and the patient was willing to proceed.  OPERATIVE IMPLANTS: Cleanse choice wound VAC sponges x8.  @ENCIMAGES @  OPERATIVE FINDINGS: Patient had extensive abscess involving the adipose tissue. The fascia was intact and viable the muscle was intact and viable in all 4 compartments.  OPERATIVE PROCEDURE: Patient was brought the operating room and underwent a general anesthetic. After adequate levels anesthesia were obtained patient's right lower extremity was prepped using DuraPrep draped into a sterile field a timeout was called. An extensile incision was made medially and laterally to release the compartments there was an extensive abscess involving necrotic adipose tissue. Patient underwent extensive excision of the necrotic adipose tissue visualization of the fascia showed a healthy viable fascia and the deep and posterior compartments. The fascia was incised the muscle was also healthy and viable no evidence of infection deep to the fascia. The similar incision was made anterior laterally over the anterior and lateral compartments the adipose tissue was necrotic this was excised extensively. Fasciotomies were then performed over the anterior lateral compartments the fascia  was healthy viable the muscle was healthy and viable. The wounds were irrigated with normal saline the both wounds were packed open with the cleanse choice wound vacs sealed with derma tack Ioban and installation therapy was begun with 30 cc of installation. There was a good seal. Patient was extubated taken to PACU in stable condition.  Debridement type: Excisional Debridement  Side: right  Body Location: right leg   Tools used for debridement: scalpel, scissors, curette and rongeur  Pre-debridement Wound size (cm):   Length: 0        Width: 0     Depth: 0   Post-debridement Wound size (cm):   Length: 20 x 2         Width: 10 x 2     Depth: 3   Debridement depth beyond dead/damaged tissue down to healthy viable tissue: yes  Tissue layer involved: skin, skin, subcutaneous tissue and skin, subcutaneous tissue, muscle / fascia  Nature of tissue removed: Slough, Necrotic, Devitalized Tissue, Non-viable tissue and Purulence  Irrigation volume: 1 liter     Irrigation fluid type: Normal Saline        DISCHARGE PLANNING:  Antibiotic duration: Continue IV antibiotics  Adipose tissue sent for cultures.  Weightbearing: Weightbearing as tolerated  Pain medication: Opioid pathway  Dressing care/ Wound VAC: Continue installation wound VAC  Ambulatory devices: Walker  Discharge to: Plan for return to the operating room on Friday for repeat debridement possible skin graft possible placement of installation wound VAC.  Follow-up: In the office 1 week post operative.

## 2020-10-11 NOTE — Anesthesia Preprocedure Evaluation (Signed)
Anesthesia Evaluation  Patient identified by MRN, date of birth, ID band Patient awake    Reviewed: Allergy & Precautions, NPO status , Patient's Chart, lab work & pertinent test results  Airway Mallampati: II  TM Distance: >3 FB Neck ROM: Full    Dental  (+) Missing, Dental Advisory Given   Pulmonary former smoker,  Covid-19 + 6 days ago   Pulmonary exam normal breath sounds clear to auscultation       Cardiovascular hypertension, Pt. on medications  Rhythm:Regular Rate:Normal     Neuro/Psych negative neurological ROS  negative psych ROS   GI/Hepatic negative GI ROS, Neg liver ROS,   Endo/Other  Obesity Elevated glucose on admission Hx/o hyperlipidemia  Renal/GU negative Renal ROS  negative genitourinary   Musculoskeletal Cellulitis and Deep Abscess right lower leg with possible compartment syndrome S/P blunt trauma   Abdominal (+) + obese,   Peds  Hematology  (+) anemia ,   Anesthesia Other Findings   Reproductive/Obstetrics                             Anesthesia Physical Anesthesia Plan  ASA: III and emergent  Anesthesia Plan: General   Post-op Pain Management:    Induction: Intravenous  PONV Risk Score and Plan: 3 and Treatment may vary due to age or medical condition and Ondansetron  Airway Management Planned: LMA  Additional Equipment:   Intra-op Plan:   Post-operative Plan: Extubation in OR  Informed Consent: I have reviewed the patients History and Physical, chart, labs and discussed the procedure including the risks, benefits and alternatives for the proposed anesthesia with the patient or authorized representative who has indicated his/her understanding and acceptance.     Dental advisory given  Plan Discussed with: CRNA and Anesthesiologist  Anesthesia Plan Comments:         Anesthesia Quick Evaluation

## 2020-10-11 NOTE — Transfer of Care (Signed)
Immediate Anesthesia Transfer of Care Note  Patient: Andres [name]  Procedure(s) Performed: RIGHT LEG FASCIOTOMIES AND DEBRIDE ULCER (Right Leg Lower)  Patient Location: PACU  Anesthesia Type:General  Level of Consciousness: awake, alert  and oriented  Airway & Oxygen Therapy: Patient Spontanous Breathing and Patient connected to nasal cannula oxygen  Post-op Assessment: Report given to RN and Post -op Vital signs reviewed and stable  Post vital signs: Reviewed and stable  Last Vitals:  Vitals Value Taken Time  BP 126/75 10/11/20 1618  Temp    Pulse 65 10/11/20 1621  Resp 19 10/11/20 1621  SpO2 96 % 10/11/20 1621  Vitals shown include unvalidated device data.  Last Pain:  Vitals:   10/11/20 1400  TempSrc:   PainSc: 7       Patients Stated Pain Goal: 3 (59/97/74 1423)  Complications: No complications documented.

## 2020-10-11 NOTE — Anesthesia Postprocedure Evaluation (Signed)
Anesthesia Post Note  Patient: Andres [name]  Procedure(s) Performed: RIGHT LEG FASCIOTOMIES AND DEBRIDE ULCER (Right Leg Lower)     Patient location during evaluation: PACU Anesthesia Type: General Level of consciousness: awake and alert Pain management: pain level controlled Vital Signs Assessment: post-procedure vital signs reviewed and stable Respiratory status: spontaneous breathing, nonlabored ventilation, respiratory function stable and patient connected to nasal cannula oxygen Cardiovascular status: blood pressure returned to baseline and stable Postop Assessment: no apparent nausea or vomiting Anesthetic complications: no   No complications documented.  Last Vitals:  Vitals:   10/11/20 1648 10/11/20 1708  BP: 132/71 138/77  Pulse: 73 65  Resp: (!) 23 20  Temp:  36.9 C  SpO2: 92% 95%    Last Pain:  Vitals:   10/11/20 1708  TempSrc: Oral  PainSc: 10-Worst pain ever                 Sehar [place]ano COKER

## 2020-10-11 NOTE — Anesthesia Procedure Notes (Signed)
Procedure Name: LMA Insertion Date/Time: 10/11/2020 3:35 PM Performed by: Josephine Igo, CRNA Pre-anesthesia Checklist: Patient identified, Emergency Drugs available, Suction available, Patient being monitored and Timeout performed Patient Re-evaluated:Patient Re-evaluated prior to induction Oxygen Delivery Method: Circle system utilized Preoxygenation: Pre-oxygenation with 100% oxygen Induction Type: IV induction Ventilation: Mask ventilation without difficulty LMA: LMA inserted LMA Size: 4.0

## 2020-10-11 NOTE — Interval H&P Note (Signed)
History and Physical Interval Note:  10/11/2020 7:02 AM  Andres [name]  has presented today for surgery, with the diagnosis of Abscess Right Leg.  The various methods of treatment have been discussed with the patient and family. After consideration of risks, benefits and other options for treatment, the patient has consented to  Procedure(s): RIGHT LEG FASCIOTOMIES AND DEBRIDE ULCER (Right) as a surgical intervention.  The patient's history has been reviewed, patient examined, no change in status, stable for surgery.  I have reviewed the patient's chart and labs.  Questions were answered to the patient's satisfaction.     Newt Minion

## 2020-10-12 ENCOUNTER — Encounter (HOSPITAL_COMMUNITY): Payer: Self-pay | Admitting: Orthopedic Surgery

## 2020-10-12 ENCOUNTER — Other Ambulatory Visit: Payer: Self-pay | Admitting: Physician Assistant

## 2020-10-12 DIAGNOSIS — L02415 Cutaneous abscess of right lower limb: Secondary | ICD-10-CM | POA: Diagnosis not present

## 2020-10-12 DIAGNOSIS — U071 COVID-19: Secondary | ICD-10-CM | POA: Diagnosis not present

## 2020-10-12 DIAGNOSIS — L03115 Cellulitis of right lower limb: Secondary | ICD-10-CM | POA: Diagnosis not present

## 2020-10-12 LAB — CREATININE, SERUM
Creatinine, Ser: 0.92 mg/dL (ref 0.61–1.24)
GFR, Estimated: >60 mL/min (ref >60)

## 2020-10-12 MED ORDER — SODIUM CHLORIDE 0.9 % IV SOLN: 100.0000 mg | Freq: Every day | INTRAVENOUS | Status: DC

## 2020-10-12 MED ORDER — CEFAZOLIN SODIUM-DEXTROSE 2-4 GM/100ML-% IV SOLN: 2.0000 g | INTRAVENOUS | Status: DC

## 2020-10-12 MED ORDER — SODIUM CHLORIDE 0.9 % IV SOLN
200.0000 mg | Freq: Once | INTRAVENOUS | Status: DC
  Filled 2020-10-12: qty 40

## 2020-10-12 NOTE — Plan of Care (Addendum)
Canister changed out @0200    Problem: Education: Goal: Knowledge of General Education information will improve Description: Including pain rating scale, medication(s)/side effects and non-pharmacologic comfort measures Outcome: Progressing   Problem: Activity: Goal: Risk for activity intolerance will decrease Outcome: Progressing   Problem: Pain Managment: Goal: General experience of comfort will improve Outcome: Progressing   Problem: Safety: Goal: Ability to remain free from injury will improve Outcome: Progressing   Problem: Skin Integrity: Goal: Risk for impaired skin integrity will decrease Outcome: Progressing

## 2020-10-12 NOTE — H&P (View-Only) (Signed)
Patient ID: Andres [name], male   DOB: [birth year], 65 y.o.   MRN: [id number] Patient is a 66 year old gentleman who is postoperative day 1 massive abscess right leg.  Extensile medial and lateral incisions with fasciotomies, the fascia was healthy the muscle was healthy, infection seem to be confined to the adipose tissue layer.  Cultures are pending, installation wound VAC functioning well, patient states that he feels better he has no respiratory symptoms.  We will plan for return to the operating room tomorrow for repeat debridement.  Patient may require 1 additional surgical procedure for debridement and skin grafting, will proceed with definitive wound care once the wound bed is healthy and viable.

## 2020-10-12 NOTE — Progress Notes (Signed)
Physical Therapy Re-Evaluation Treatment Patient Details Name: Andres [name] MRN: [id number] DOB: Nov 30, 1955 Today's Date: 10/12/2020    History of Present Illness 65 y.o. male presented to the ED on 10/05/2020 with worsening right lower extremity cellulitis, and upper respiratory tract symptoms. Imaging showed  myositis R anterior tibialis and medial soleus. Pt is also covid positive. Patient s/p R leg fasciotomies x4 & debridement on 2/9. Plan for repeat I&D on 2/11.    PT Comments    Re-eval completed. Patient now s/p procedure above and WBAT on RLE. Patient able to perform bed mobility with min guard. Patient requires heavy modA for sit to stand transfer with RW. Patient presents with impaired balance, decreased activity tolerance, generalized weakness, and impaired functional mobility. Discharge recommendation HHPT vs SNF pending progress. Anticipate as pain is managed, patient may progress quickly.   Follow Up Recommendations  Home health PT;SNF (HHPT vs SNF pending progress)     Equipment Recommendations  Rolling Janan Bogie with 5" wheels;3in1 (PT)    Recommendations for Other Services       Precautions / Restrictions Precautions Precautions: Fall Precaution Comments: wound vac Restrictions Weight Bearing Restrictions: Yes RLE Weight Bearing: Weight bearing as tolerated    Mobility  Bed Mobility Overal bed mobility: Needs Assistance Bed Mobility: Supine to Sit;Sit to Supine     Supine to sit: Min guard Sit to supine: Min guard   General bed mobility comments: min guard for safety, no physical assist required    Transfers Overall transfer level: Needs assistance Equipment used: Rolling Nael Petrosyan (2 wheeled) Transfers: Sit to/from Stand Sit to Stand: Mod assist;From elevated surface         General transfer comment: modA to stand with RW, cues for hand placement. Unable to tolerate WB through R LE in standing. Patient able to pivot foot towards HOB <2  feet  Ambulation/Gait                 Stairs             Wheelchair Mobility    Modified Rankin (Stroke Patients Only)       Balance Overall balance assessment: Needs assistance Sitting-balance support: No upper extremity supported;Feet supported Sitting balance-Leahy Scale: Fair     Standing balance support: Bilateral upper extremity supported;During functional activity Standing balance-Leahy Scale: Poor Standing balance comment: reliant on UE support                            Cognition Arousal/Alertness: Awake/alert Behavior During Therapy: WFL for tasks assessed/performed Overall Cognitive Status: Within Functional Limits for tasks assessed                                        Exercises      General Comments General comments (skin integrity, edema, etc.): Patient on 2L O2 [place] upon arrival with spO2 97%. Removed O2 with patient able to maintain >93% throughout session      Pertinent Vitals/Pain Pain Assessment: Faces Faces Pain Scale: Hurts whole lot Pain Location: RLE with movement Pain Descriptors / Indicators: Grimacing;Guarding;Moaning Pain Intervention(s): Monitored during session    Home Living                      Prior Function            PT Goals (current goals can now  be found in the care plan section) Acute Rehab PT Goals Patient Stated Goal: return to independence with mobilitly PT Goal Formulation: With patient Time For Goal Achievement: 10/24/20 Potential to Achieve Goals: Good Progress towards PT goals: Progressing toward goals    Frequency    Min 3X/week      PT Plan Current plan remains appropriate    Co-evaluation              AM-PAC PT "6 Clicks" Mobility   Outcome Measure  Help needed turning from your back to your side while in a flat bed without using bedrails?: A Little Help needed moving from lying on your back to sitting on the side of a flat bed without  using bedrails?: A Little Help needed moving to and from a bed to a chair (including a wheelchair)?: A Little Help needed standing up from a chair using your arms (e.g., wheelchair or bedside chair)?: A Lot Help needed to walk in hospital room?: A Lot Help needed climbing 3-5 steps with a railing? : A Lot 6 Click Score: 15    End of Session Equipment Utilized During Treatment: Gait belt Activity Tolerance: Patient limited by pain Patient left: in bed;with call bell/phone within reach;with bed alarm set Nurse Communication: Mobility status PT Visit Diagnosis: Difficulty in walking, not elsewhere classified (R26.2);Pain Pain - Right/Left: Right Pain - part of body: Leg     Time: 1550-1617 PT Time Calculation (min) (ACUTE ONLY): 27 min  Charges:  $Therapeutic Activity: 8-22 mins                     Zonnique Norkus A. Gilford Rile PT, DPT Acute Rehabilitation Services Pager [phone] Office [phone]    Linna Hoff 10/12/2020, 5:36 PM

## 2020-10-12 NOTE — Progress Notes (Signed)
[facility] PROGRESS NOTE    Progress Note  Andres [name]  KAJ:[id number] DOB: [birth year] DOA: 10/05/2020 PCP: Mackie Pai, PA-C     Brief Narrative:   Andres [name] is an 65 y.o. male past medical history of hypertension comes into the ED on 10/05/2020 for worsening right lower extremity cellulitis, he had initially been on oral antibiotics but due to worsening presented to the ED MRI showed diffuse fairly marked subcutaneous soft tissue swelling and edema no evidence of abscess, with some myositis in the anterior tibial muscles.  Antibiotic regimen: Rocephin and daptomycin started on 10/06/2020  Cultured data:  Procedures: 10/11/2020 right leg fasciotomy for compartment with excisional debridement of soft tissue muscle and fascia. 10/11/2020 wound VAC placement  Assessment/Plan:   Cellulitis of right lower extremity with Myositis of the anterior tibial muscles:: Has remained afebrile, white blood cell count is elevated likely due to steroids. T-max of 100.5 on 2/9/ 2022.  Since then has remained afebrile. Orthopedic surgery was consulted surgical intervention on 2 /9//2022, going for surgical debridement again on 10/13/2018. Wound VAC in place Continue daptomycinand Rocephin. 2 blood cultures was positive for Corynebacterium diphtheria I likely contaminant.  Incidental COVID-19 infection: Chest x-ray showed no infiltrates, he is currently on 2 L of oxygen is satting 98%. We will try to wean to room air. We will start treatment empirically with IV remdesivir for 3 days.  Hypovolemic hyponatremia: Resolved with IV fluid hydration. Recheck a basic metabolic panel tomorrow morning.  Acute kidney injury: Likely prerenal azotemia resolved with IV fluid hydration.  Essential hypertension Continue to hold antihypertensive medication, continue hydralazine IV.   DVT prophylaxis: lovenox Family Communication:none Status is: Inpatient  Remains inpatient appropriate  because:Hemodynamically unstable   Dispo: The patient is from: Home              Anticipated d/c is to: Home              Anticipated d/c date is: 3 days              Patient currently is not medically stable to d/c.   Difficult to place patient No        Code Status:     Code Status Orders  (From admission, onward)         Start     Ordered   10/05/20 2009  Full code  Continuous        10/05/20 2009        Code Status History    Date Active Date Inactive Code Status Order ID Comments User Context   11/08/2018 0939 11/09/2018 2124 Full Code [id number]  Barb Merino, MD Inpatient   Advance Care Planning Activity        IV Access:    Peripheral IV   Procedures and diagnostic studies:   No results found.   Medical Consultants:    None.   Subjective:    Andres [name] relates that the pain medication is working.   Objective:    Vitals:   10/11/20 2300 10/12/20 0315 10/12/20 0753 10/12/20 0829  BP: (!) 144/75 125/71 130/75   Pulse: 82 85 84   Resp: 18 16 18    Temp: 100.2 F (37.9 C) 99 F (37.2 C)  98.5 F (36.9 C)  TempSrc: Oral Oral  Axillary  SpO2: 98% 99% 97%   Weight:      Height:       SpO2: 97 % O2 Flow Rate (L/min): 2 L/min   Intake/Output  Summary (Last 24 hours) at 10/12/2020 0915 Last data filed at 10/12/2020 0537 Gross per 24 hour  Intake 2080.37 ml  Output 800 ml  Net 1280.37 ml   Filed Weights   10/05/20 1203 10/05/20 1430 10/05/20 1930  Weight: 107 kg 107 kg 116.9 kg    Exam: General exam: In no acute distress. Respiratory system: Good air movement and clear to auscultation. Cardiovascular system: S1 & S2 heard, RRR. No JVD. Gastrointestinal system: Abdomen is nondistended, soft and nontender.  Extremities: Right leg with wound VAC in place wrapped Skin: No rashes, lesions or ulcers Psychiatry: Judgement and insight appear normal. Mood & affect appropriate.   Data Reviewed:    Labs: Basic Metabolic  Panel: Recent Labs  Lab 10/07/20 0525 10/08/20 0026 10/09/20 0347 10/10/20 0427 10/11/20 0235 10/12/20 0500  NA 127*  125* 125* 132* 136 134*  --   K 3.5 4.1 4.2 3.9 4.3  --   CL 94* 91* 98 99 100  --   CO2 19* 23 25 26 22   --   GLUCOSE 91 182* 189* 127* 83  --   BUN 16 20 24* 22 17  --   CREATININE 1.11 0.98 1.04 0.94 0.92 0.92  CALCIUM 7.6* 7.8* 7.9* 8.3* 7.8*  --    GFR Estimated Creatinine Clearance: 108.2 mL/min (by C-G formula based on SCr of 0.92 mg/dL). Liver Function Tests: Recent Labs  Lab 10/06/20 0106 10/07/20 0525 10/08/20 0026 10/09/20 0347 10/10/20 0427  AST 19 34 27 22 21   ALT 27 45* 43 38 43  ALKPHOS 45 47 67 59 50  BILITOT 0.7 0.8 0.3 0.5 0.3  PROT 6.2* 6.1* 6.4* 5.5* 5.8*  ALBUMIN 2.6* 2.3* 2.4* 2.1* 2.2*   No results for input(s): LIPASE, AMYLASE in the last 168 hours. No results for input(s): AMMONIA in the last 168 hours. Coagulation profile Recent Labs  Lab 10/05/20 1249  INR 1.1   COVID-19 Labs  Recent Labs    10/10/20 0427  DDIMER 0.99*  CRP 5.2*    Lab Results  Component Value Date   SARSCOV2NAA POSITIVE (A) 10/05/2020    CBC: Recent Labs  Lab 10/07/20 0525 10/08/20 0026 10/09/20 0347 10/10/20 0427 10/11/20 0235  WBC 10.7* 14.5* 23.3* 18.4* 18.1*  NEUTROABS 8.9* 12.7* 19.4* 13.3* 14.8*  HGB 11.5* 11.4* 11.0* 11.1* 12.4*  HCT 35.1* 34.4* 33.2* 33.9* 38.1*  MCV 89.5 87.5 86.9 88.7 87.8  PLT 199 250 296 351 398   Cardiac Enzymes: Recent Labs  Lab 10/07/20 0525  CKTOTAL 55   BNP (last 3 results) No results for input(s): PROBNP in the last 8760 hours. CBG: No results for input(s): GLUCAP in the last 168 hours. D-Dimer: Recent Labs    10/10/20 0427  DDIMER 0.99*   Hgb A1c: No results for input(s): HGBA1C in the last 72 hours. Lipid Profile: No results for input(s): CHOL, HDL, LDLCALC, TRIG, CHOLHDL, LDLDIRECT in the last 72 hours. Thyroid function studies: No results for input(s): TSH, T4TOTAL, T3FREE,  THYROIDAB in the last 72 hours.  Invalid input(s): FREET3 Anemia work up: No results for input(s): VITAMINB12, FOLATE, FERRITIN, TIBC, IRON, RETICCTPCT in the last 72 hours. Sepsis Labs: Recent Labs  Lab 10/05/20 1309 10/05/20 2118 10/08/20 0026 10/09/20 0347 10/10/20 0427 10/11/20 0235  WBC  --    < > 14.5* 23.3* 18.4* 18.1*  LATICACIDVEN 1.3  --   --   --   --   --    < > = values in this  interval not displayed.   Microbiology Recent Results (from the past 240 hour(s))  SARS Coronavirus 2 by RT PCR (hospital order, performed in [facility] hospital lab) Nasopharyngeal Peripheral     Status: Abnormal   Collection Time: 10/05/20  1:09 PM   Specimen: Peripheral; Nasopharyngeal  Result Value Ref Range Status   SARS Coronavirus 2 POSITIVE (A) NEGATIVE Final    Comment: RESULT CALLED TO, READ BACK BY AND VERIFIED WITH: [place]. C, RN @ 4235 ON 10/05/2020, [email or web address].P (NOTE) SARS-CoV-2 target nucleic acids are DETECTED  SARS-CoV-2 RNA is generally detectable in upper respiratory specimens  during the acute phase of infection.  Positive results are indicative  of the presence of the identified virus, but do not rule out bacterial infection or co-infection with other pathogens not detected by the test.  Clinical correlation with patient history and  other diagnostic information is necessary to determine patient infection status.  The expected result is negative.  Fact Sheet for Patients:   StrictlyIdeas.no   Fact Sheet for Healthcare Providers:   BankingDealers.co.za    This test is not yet approved or cleared by the [place] FDA and  has been authorized for detection and/or diagnosis of SARS-CoV-2 by FDA under an Emergency Use Authorization (EUA).  This EUA will remain in effec t (meaning this test can be used) for the duration of  the COVID-19 declaration under Section 564(b)(1) of the Act, 21 U.S.C. section  360-bbb-3(b)(1), unless the authorization is terminated or revoked sooner.  Performed at [facility], [address] [place], [place] [address]   Blood Culture (routine x 2)     Status: Abnormal   Collection Time: 10/05/20  1:09 PM   Specimen: BLOOD  Result Value Ref Range Status   Specimen Description   Final    BLOOD LEFT ANTECUBITAL Performed at [facility], [place] [address] [place], [place] [address]    Special Requests   Final    BOTTLES DRAWN AEROBIC AND ANAEROBIC Blood Culture results may not be optimal due to an inadequate volume of blood received in culture bottles Performed at [facility], [address] [place], [place] [address]    Culture  Setup Time   Final    GRAM POSITIVE RODS AEROBIC BOTTLE ONLY CRITICAL RESULT CALLED TO, READ BACK BY AND VERIFIED WITH: PHARMD N [place] [id number] AT 1530 BY CM    Culture (A)  Final    CORYNEBACTERIUM PSEUDODIPHTHERIAE Standardized susceptibility testing for this organism is not available. Performed at [facility], [place] [address] [place], [place] [address]    Report Status 10/09/2020 FINAL  Final  Blood Culture (routine x 2)     Status: None   Collection Time: 10/05/20  1:10 PM   Specimen: BLOOD RIGHT FOREARM  Result Value Ref Range Status   Specimen Description   Final    BLOOD RIGHT FOREARM Performed at [facility], [address] [place], [place] [address]    Special Requests   Final    BOTTLES DRAWN AEROBIC AND ANAEROBIC Blood Culture results may not be optimal due to an inadequate volume of blood received in culture bottles Performed at [facility], [address] [place], [place] [address]    Culture   Final    NO GROWTH 5 DAYS Performed at [facility], [place] [address], [place], [place] [address]    Report Status 10/10/2020 FINAL  Final  MRSA PCR Screening     Status: None   Collection Time: 10/05/20 10:35 PM   Specimen: Nasopharyngeal  Result  Value Ref Range Status   MRSA by PCR NEGATIVE NEGATIVE Final    Comment:        The GeneXpert MRSA Assay (FDA approved for NASAL specimens only), is one component of a comprehensive MRSA colonization surveillance program. It is not intended to diagnose MRSA infection nor to guide or monitor treatment for MRSA infections. Performed at [facility], [place] [address] [place], [place] [address]   Culture, blood (routine x 2)     Status: None (Preliminary result)   Collection Time: 10/08/20 12:26 AM   Specimen: BLOOD  Result Value Ref Range Status   Specimen Description   Final    BLOOD RIGHT ANTECUBITAL Performed at [place] [address], [place], [place] [address]    Special Requests   Final    BOTTLES DRAWN AEROBIC ONLY Blood Culture results may not be optimal due to an excessive volume of blood received in culture bottles Performed at [place] [address] [place], [place] [address]    Culture   Final    NO GROWTH 4 DAYS Performed at [facility], [place] [address] [place], [place] [address]    Report Status PENDING  Incomplete  Culture, blood (routine x 2)     Status: None (Preliminary result)   Collection Time: 10/08/20 12:26 AM   Specimen: BLOOD  Result Value Ref Range Status   Specimen Description   Final    BLOOD RIGHT HAND Performed at [place] [address] [place], [place] [address]    Special Requests   Final    BOTTLES DRAWN AEROBIC ONLY Blood Culture results may not be optimal due to an excessive volume of blood received in culture bottles Performed at [place] [address] [place], [place] [address]    Culture   Final    NO GROWTH 4 DAYS Performed at [facility], [place] [address], [place], [place] [address]    Report Status PENDING  Incomplete  Culture, blood (routine x 2)     Status: None (Preliminary result)   Collection  Time: 10/09/20 11:12 AM   Specimen: BLOOD RIGHT HAND  Result Value Ref Range Status   Specimen Description   Final    BLOOD RIGHT HAND Performed at [place] [address] [place], [place] [address]    Special Requests   Final    BOTTLES DRAWN AEROBIC ONLY Blood Culture adequate volume Performed at [place] [address] [place], [place] [address]    Culture   Final    NO GROWTH 3 DAYS Performed at [facility], [place] [address] [place], [place] [address]    Report Status PENDING  Incomplete  Culture, blood (routine x 2)     Status: None (Preliminary result)   Collection Time: 10/09/20 11:19 AM   Specimen: BLOOD  Result Value Ref Range Status   Specimen Description   Final    BLOOD LEFT ANTECUBITAL Performed at [place] [address] [place],  [address]    Special Requests   Final    BOTTLES DRAWN AEROBIC AND ANAEROBIC Blood Culture adequate volume Performed at [place] [address], [place],  [address]    Culture   Final  NO GROWTH 3 DAYS Performed at [facility], [place] [address] [place], [place] [address]    Report Status PENDING  Incomplete  MRSA PCR Screening     Status: None   Collection Time: 10/10/20  8:29 PM   Specimen: Nasal Mucosa; Nasopharyngeal  Result Value Ref Range Status   MRSA by PCR NEGATIVE NEGATIVE Final    Comment:        The GeneXpert MRSA Assay (FDA approved for NASAL specimens only), is one component of a comprehensive MRSA colonization surveillance program. It is not intended to diagnose MRSA infection nor to guide or monitor treatment for MRSA infections. Performed at [facility], [place] [address], [place], [place] [address]   Aerobic/Anaerobic Culture (surgical/deep wound)     Status: None (Preliminary result)   Collection Time: 10/11/20  3:41 PM   Specimen: Soft Tissue, Other  Result Value Ref Range  Status   Specimen Description TISSUE RIGHT LEG  Final   Special Requests NONE  Final   Gram Stain PENDING  Incomplete   Culture   Final    NO GROWTH < 12 HOURS Performed at [facility], [place] [address], [place], [place] [address]    Report Status PENDING  Incomplete     Medications:   . vitamin C  500 mg Oral Daily  . aspirin  81 mg Oral Daily  . benzonatate  200 mg Oral TID  . [START ON 10/14/2020] enoxaparin (LOVENOX) injection  60 mg Subcutaneous Q24H  . mouth rinse  15 mL Mouth Rinse BID  . sodium chloride  1 g Oral BID WC  . zinc sulfate  220 mg Oral Daily   Continuous Infusions: . sodium chloride 75 mL/hr at 10/11/20 1723  . cefTRIAXone (ROCEPHIN)  IV 2 g (10/11/20 2216)  . DAPTOmycin (CUBICIN)  IV 700 mg (10/11/20 2045)      LOS: 7 days   Charlynne Cousins  [facility]  10/12/2020, 9:15 AM

## 2020-10-12 NOTE — Progress Notes (Signed)
Patient ID: Andres [name], male   DOB: [birth year], 65 y.o.   MRN: [id number] Patient is a 65 year old gentleman who is postoperative day 1 massive abscess right leg.  Extensile medial and lateral incisions with fasciotomies, the fascia was healthy the muscle was healthy, infection seem to be confined to the adipose tissue layer.  Cultures are pending, installation wound VAC functioning well, patient states that he feels better he has no respiratory symptoms.  We will plan for return to the operating room tomorrow for repeat debridement.  Patient may require 1 additional surgical procedure for debridement and skin grafting, will proceed with definitive wound care once the wound bed is healthy and viable.

## 2020-10-13 ENCOUNTER — Inpatient Hospital Stay (HOSPITAL_COMMUNITY): Payer: Worker's Compensation | Admitting: Anesthesiology

## 2020-10-13 ENCOUNTER — Encounter (HOSPITAL_COMMUNITY)
Admission: EM | Disposition: A | Discharge status: Rehabilitation Facility | Payer: Self-pay | Source: Home / Self Care | Attending: Internal Medicine

## 2020-10-13 DIAGNOSIS — M726 Necrotizing fasciitis: Secondary | ICD-10-CM | POA: Diagnosis not present

## 2020-10-13 DIAGNOSIS — L03115 Cellulitis of right lower limb: Secondary | ICD-10-CM | POA: Diagnosis not present

## 2020-10-13 DIAGNOSIS — U071 COVID-19: Secondary | ICD-10-CM | POA: Diagnosis not present

## 2020-10-13 DIAGNOSIS — L02415 Cutaneous abscess of right lower limb: Secondary | ICD-10-CM | POA: Diagnosis not present

## 2020-10-13 HISTORY — PX: I & D EXTREMITY: SHX5045

## 2020-10-13 LAB — CBC
HCT: 32.9 % — ABNORMAL LOW (ref 39.0–52.0)
Hemoglobin: 10.4 g/dL — ABNORMAL LOW (ref 13.0–17.0)
MCH: 28.6 pg (ref 26.0–34.0)
MCHC: 31.6 g/dL (ref 30.0–36.0)
MCV: 90.4 fL (ref 80.0–100.0)
Platelets: 387 10*3/uL (ref 150–400)
RBC: 3.64 MIL/uL — ABNORMAL LOW (ref 4.22–5.81)
RDW: 14.6 % (ref 11.5–15.5)
WBC: 13.8 10*3/uL — ABNORMAL HIGH (ref 4.0–10.5)
nRBC: 0 % (ref 0.0–0.2)

## 2020-10-13 LAB — BASIC METABOLIC PANEL
Anion gap: 9 (ref 5–15)
BUN: 14 mg/dL (ref 8–23)
CO2: 22 mmol/L (ref 22–32)
Calcium: 7.7 mg/dL — ABNORMAL LOW (ref 8.9–10.3)
Chloride: 103 mmol/L (ref 98–111)
Creatinine, Ser: 0.91 mg/dL (ref 0.61–1.24)
GFR, Estimated: >60 mL/min (ref >60)
Glucose, Bld: 91 mg/dL (ref 70–99)
Potassium: 4.4 mmol/L (ref 3.5–5.1)
Sodium: 134 mmol/L — ABNORMAL LOW (ref 135–145)

## 2020-10-13 LAB — CULTURE, BLOOD (ROUTINE X 2)
Culture: NO GROWTH
Culture: NO GROWTH

## 2020-10-13 LAB — CK: Total CK: 67 U/L (ref 49–397)

## 2020-10-13 SURGERY — IRRIGATION AND DEBRIDEMENT EXTREMITY
Anesthesia: General | Laterality: Right

## 2020-10-13 MED ORDER — PHENYLEPHRINE 40 MCG/ML (10ML) SYRINGE FOR IV PUSH (FOR BLOOD PRESSURE SUPPORT)
PREFILLED_SYRINGE | INTRAVENOUS | Status: DC | PRN
  Administered 2020-10-13 (×2): 80 ug via INTRAVENOUS

## 2020-10-13 MED ORDER — MIDAZOLAM HCL 2 MG/2ML IJ SOLN
INTRAMUSCULAR | Status: AC
  Filled 2020-10-13: qty 2

## 2020-10-13 MED ORDER — PROPOFOL 10 MG/ML IV BOLUS
INTRAVENOUS | Status: DC | PRN
  Administered 2020-10-13: 200 mg via INTRAVENOUS

## 2020-10-13 MED ORDER — SODIUM CHLORIDE 0.9 % IR SOLN
Status: DC | PRN
  Administered 2020-10-13: 3000 mL

## 2020-10-13 MED ORDER — MIDAZOLAM HCL 2 MG/2ML IJ SOLN
INTRAMUSCULAR | Status: DC | PRN
  Administered 2020-10-13: 2 mg via INTRAVENOUS

## 2020-10-13 MED ORDER — 0.9 % SODIUM CHLORIDE (POUR BTL) OPTIME
TOPICAL | Status: DC | PRN
  Administered 2020-10-13: 1000 mL

## 2020-10-13 MED ORDER — POLYETHYLENE GLYCOL 3350 17 G PO PACK
17.0000 g | PACK | Freq: Two times a day (BID) | ORAL | Status: AC
  Administered 2020-10-13: 17 g via ORAL
  Filled 2020-10-13 (×2): qty 1

## 2020-10-13 MED ORDER — OXYCODONE HCL 5 MG/5ML PO SOLN: 5.0000 mg | Freq: Once | ORAL | Status: DC | PRN

## 2020-10-13 MED ORDER — OXYCODONE HCL 5 MG PO TABS
5.0000 mg | ORAL_TABLET | Freq: Once | ORAL | Status: DC | PRN
Start: 2020-10-13 — End: 2020-10-13

## 2020-10-13 MED ORDER — DEXAMETHASONE SODIUM PHOSPHATE 10 MG/ML IJ SOLN
INTRAMUSCULAR | Status: DC | PRN
  Administered 2020-10-13: 5 mg via INTRAVENOUS

## 2020-10-13 MED ORDER — ONDANSETRON HCL 4 MG/2ML IJ SOLN
INTRAMUSCULAR | Status: DC | PRN
  Administered 2020-10-13: 4 mg via INTRAVENOUS

## 2020-10-13 MED ORDER — METOCLOPRAMIDE HCL 5 MG/ML IJ SOLN: 5.0000 mg | Freq: Three times a day (TID) | INTRAMUSCULAR | Status: DC | PRN

## 2020-10-13 MED ORDER — LACTATED RINGERS IV SOLN: INTRAVENOUS | Status: DC | PRN

## 2020-10-13 MED ORDER — METOCLOPRAMIDE HCL 5 MG PO TABS: 5.0000 mg | ORAL_TABLET | Freq: Three times a day (TID) | ORAL | Status: DC | PRN

## 2020-10-13 MED ORDER — PROPOFOL 1000 MG/100ML IV EMUL
INTRAVENOUS | Status: AC
  Filled 2020-10-13: qty 100

## 2020-10-13 MED ORDER — FENTANYL CITRATE (PF) 100 MCG/2ML IJ SOLN
INTRAMUSCULAR | Status: AC
  Filled 2020-10-13: qty 4

## 2020-10-13 MED ORDER — FENTANYL CITRATE (PF) 250 MCG/5ML IJ SOLN
INTRAMUSCULAR | Status: AC
  Filled 2020-10-13: qty 5

## 2020-10-13 MED ORDER — AMISULPRIDE (ANTIEMETIC) 5 MG/2ML IV SOLN: 10.0000 mg | Freq: Once | INTRAVENOUS | Status: DC | PRN

## 2020-10-13 MED ORDER — FENTANYL CITRATE (PF) 250 MCG/5ML IJ SOLN
INTRAMUSCULAR | Status: DC | PRN
  Administered 2020-10-13: 25 ug via INTRAVENOUS
  Administered 2020-10-13 (×2): 50 ug via INTRAVENOUS
  Administered 2020-10-13: 25 ug via INTRAVENOUS

## 2020-10-13 MED ORDER — SODIUM CHLORIDE 0.9 % IV SOLN: INTRAVENOUS | Status: DC

## 2020-10-13 MED ORDER — ONDANSETRON HCL 4 MG/2ML IJ SOLN: 4.0000 mg | Freq: Once | INTRAMUSCULAR | Status: DC | PRN

## 2020-10-13 MED ORDER — LIDOCAINE 2% (20 MG/ML) 5 ML SYRINGE
INTRAMUSCULAR | Status: DC | PRN
  Administered 2020-10-13: 60 mg via INTRAVENOUS

## 2020-10-13 MED ORDER — FENTANYL CITRATE (PF) 100 MCG/2ML IJ SOLN
25.0000 ug | INTRAMUSCULAR | Status: DC | PRN
  Administered 2020-10-13 (×3): 25 ug via INTRAVENOUS

## 2020-10-13 MED ORDER — HYDROMORPHONE HCL 1 MG/ML IJ SOLN
INTRAMUSCULAR | Status: AC
  Filled 2020-10-13: qty 0.5

## 2020-10-13 MED ORDER — HYDROMORPHONE HCL 1 MG/ML IJ SOLN
INTRAMUSCULAR | Status: DC | PRN
  Administered 2020-10-13: .5 mg via INTRAVENOUS

## 2020-10-13 MED ORDER — CEFAZOLIN SODIUM 1 G IJ SOLR
INTRAMUSCULAR | Status: AC
  Filled 2020-10-13: qty 20

## 2020-10-13 SURGICAL SUPPLY — 38 items
BLADE SURG 21 STRL SS (BLADE) ×2 IMPLANT
BNDG COHESIVE 4X5 TAN STRL (GAUZE/BANDAGES/DRESSINGS) ×1 IMPLANT
BNDG COHESIVE 6X5 TAN STRL LF (GAUZE/BANDAGES/DRESSINGS) ×1 IMPLANT
BNDG GAUZE ELAST 4 BULKY (GAUZE/BANDAGES/DRESSINGS) ×3 IMPLANT
CASSETTE VERAFLO VERALINK (MISCELLANEOUS) ×1 IMPLANT
COVER SURGICAL LIGHT HANDLE (MISCELLANEOUS) ×4 IMPLANT
DRAPE DERMATAC (DRAPES) ×3 IMPLANT
DRAPE INCISE IOBAN 66X45 STRL (DRAPES) ×2 IMPLANT
DRAPE U-SHAPE 47X51 STRL (DRAPES) ×2 IMPLANT
DRESSING VERAFLO CLEANSE CC (GAUZE/BANDAGES/DRESSINGS) IMPLANT
DRSG ADAPTIC 3X8 NADH LF (GAUZE/BANDAGES/DRESSINGS) ×2 IMPLANT
DRSG EMULSION OIL 3X3 NADH (GAUZE/BANDAGES/DRESSINGS) ×1 IMPLANT
DRSG PAD ABDOMINAL 8X10 ST (GAUZE/BANDAGES/DRESSINGS) ×1 IMPLANT
DRSG VERAFLO CLEANSE CC (GAUZE/BANDAGES/DRESSINGS) ×2
DURAPREP 26ML APPLICATOR (WOUND CARE) ×2 IMPLANT
ELECT REM PT RETURN 9FT ADLT (ELECTROSURGICAL) ×2
ELECTRODE REM PT RTRN 9FT ADLT (ELECTROSURGICAL) IMPLANT
GAUZE SPONGE 4X4 12PLY STRL (GAUZE/BANDAGES/DRESSINGS) ×2 IMPLANT
GLOVE BIOGEL PI IND STRL 9 (GLOVE) ×1 IMPLANT
GLOVE BIOGEL PI INDICATOR 9 (GLOVE) ×1
GLOVE SURG ORTHO 9.0 STRL STRW (GLOVE) ×2 IMPLANT
GOWN STRL REUS W/ TWL XL LVL3 (GOWN DISPOSABLE) ×2 IMPLANT
GOWN STRL REUS W/TWL XL LVL3 (GOWN DISPOSABLE) ×4
HANDPIECE INTERPULSE COAX TIP (DISPOSABLE) ×2
KIT BASIN OR (CUSTOM PROCEDURE TRAY) ×2 IMPLANT
KIT TURNOVER KIT B (KITS) ×2 IMPLANT
MANIFOLD NEPTUNE II (INSTRUMENTS) ×2 IMPLANT
NS IRRIG 1000ML POUR BTL (IV SOLUTION) ×2 IMPLANT
PACK ORTHO EXTREMITY (CUSTOM PROCEDURE TRAY) ×2 IMPLANT
PAD ARMBOARD 7.5X6 YLW CONV (MISCELLANEOUS) ×4 IMPLANT
SET HNDPC FAN SPRY TIP SCT (DISPOSABLE) IMPLANT
STOCKINETTE IMPERVIOUS 9X36 MD (GAUZE/BANDAGES/DRESSINGS) ×1 IMPLANT
SUT ETHILON 2 0 PSLX (SUTURE) ×2 IMPLANT
SUT SILK 2 0 TIES 10X30 (SUTURE) ×1 IMPLANT
SWAB COLLECTION DEVICE MRSA (MISCELLANEOUS) ×2 IMPLANT
TOWEL GREEN STERILE (TOWEL DISPOSABLE) ×2 IMPLANT
TUBE CONNECTING 12X1/4 (SUCTIONS) ×2 IMPLANT
YANKAUER SUCT BULB TIP NO VENT (SUCTIONS) ×2 IMPLANT

## 2020-10-13 NOTE — Op Note (Signed)
10/13/2020  3:24 PM  PATIENT:  Andres [name]    PRE-OPERATIVE DIAGNOSIS:  Abscess Right Leg  POST-OPERATIVE DIAGNOSIS:  Same  PROCEDURE:  REPEAT DEBRIDEMENT RIGHT LEG  SURGEON:  Newt Minion, MD  PHYSICIAN ASSISTANT:None ANESTHESIA:   General  PREOPERATIVE INDICATIONS:  [name] is a  65 y.o. male with a diagnosis of Abscess Right Leg who failed conservative measures and elected for surgical management.    The risks benefits and alternatives were discussed with the patient preoperatively including but not limited to the risks of infection, bleeding, nerve injury, cardiopulmonary complications, the need for revision surgery, among others, and the patient was willing to proceed.  OPERATIVE IMPLANTS: Wound VAC sponges cleanse choice time 6  @ENCIMAGES @  OPERATIVE FINDINGS: Patient who initially had healthy viable muscle and intact fascia now has necrotic fascia and necrotic muscles.  The entire anterior compartment was necrotic partial necrosis of the gastrocnemius and soleus muscles as well as partial necrosis of the lateral compartment with extensive loss of skin.  OPERATIVE PROCEDURE: Patient was brought the operating room and underwent a general anesthetic.  After adequate levels anesthesia were obtained patient's right lower extremity was prepped using Betadine paint draped into a sterile field a timeout was called.  Patient had rested ischemic changes along the skin edges a 21 blade knife was used to excise the skin around both incisions this left 2 wounds that were 30 cm in length 10 cm in width that extended down to the tibia.  Examination of the anterior lateral compartments first showed there to be necrotic fascia over the anterior compartment this was excised the entire anterior compartment was necrotic and this was excised including the anterior tibial artery was tied off due to its involvement with the necrotic muscle.  Fascia over the lateral compartment was also  excised and patient had partial excision of the lateral compartment muscles.  The remaining muscles in the lateral compartment had good color and contractility.  Examination medially patient had progressive necrotic changes of the medial head of the gastrocnemius as well as the soleus.  Patient underwent partial excision of the gastrocnemius and soleus muscles using both a 21 blade knife and a rondure.  The necrotic muscle could even be debrided manually.  The wounds were irrigated with pulsatile lavage the remaining muscle was healthy viable had good color and contractility.  Patient underwent extensive excision of the involved fascia.  The muscle and fascia was sent for tissue cultures.  After irrigation and completion of hemostasis 3 wound VAC sponges were placed laterally and 3 wound VAC sponges medially this was covered with Ioban and derma tack.  This had a good suction fit this was overwrapped with Covan patient was initially recovered in the operating room plan for return to the floor.   DISCHARGE PLANNING:  Antibiotic duration: Continue IV antibiotics  Weightbearing: Weightbearing as tolerated on the right  Pain medication: Opioid pathway  Dressing care/ Wound VAC: Installation wound VAC with instillation of 30 cc 10 minutes of dwell time and 2 hours of suction  Ambulatory devices: Walker or crutches  Discharge to: Plan for return to the operating room on Sunday morning.  I have discussed this with the patient's wife with the extensive necrotic muscle it is unclear at this time if patient's leg is salvageable.    Follow-up: In the office 1 week post operative.

## 2020-10-13 NOTE — Transfer of Care (Signed)
Immediate Anesthesia Transfer of Care Note  Patient: Andres [name]  Procedure(s) Performed: REPEAT DEBRIDEMENT RIGHT LEG (Right )  Patient Location: PACU  Anesthesia Type:General  Level of Consciousness: drowsy and patient cooperative  Airway & Oxygen Therapy: Patient Spontanous Breathing  Post-op Assessment: Report given to RN, Post -op Vital signs reviewed and stable and Patient moving all extremities X 4  Post vital signs: Reviewed and stable  Last Vitals:  Vitals Value Taken Time  BP    Temp    Pulse    Resp    SpO2      Last Pain:  Vitals:   10/13/20 1033  TempSrc:   PainSc: 8       Patients Stated Pain Goal: 0 (05/39/76 7341)  Complications: No complications documented.

## 2020-10-13 NOTE — Anesthesia Procedure Notes (Signed)
Procedure Name: LMA Insertion Date/Time: 10/13/2020 1:58 PM Performed by: Darletta Moll, CRNA Pre-anesthesia Checklist: Patient identified, Emergency Drugs available, Suction available and Patient being monitored Patient Re-evaluated:Patient Re-evaluated prior to induction Oxygen Delivery Method: Circle system utilized Preoxygenation: Pre-oxygenation with 100% oxygen Induction Type: IV induction Ventilation: Mask ventilation without difficulty LMA: LMA inserted LMA Size: 4.0 Number of attempts: 1 Airway Equipment and Method: Oral airway Placement Confirmation: positive ETCO2,  breath sounds checked- equal and bilateral and CO2 detector Tube secured with: Tape Dental Injury: Teeth and Oropharynx as per pre-operative assessment

## 2020-10-13 NOTE — Anesthesia Preprocedure Evaluation (Signed)
Anesthesia Evaluation  Patient identified by MRN, date of birth, ID band Patient awake    Reviewed: Allergy & Precautions, NPO status , Patient's Chart, lab work & pertinent test results  Airway Mallampati: II  TM Distance: >3 FB Neck ROM: Full    Dental  (+) Missing, Dental Advisory Given   Pulmonary former smoker,  Covid-19 diagnosed 10/05/20   Pulmonary exam normal        Cardiovascular hypertension, Pt. on medications  Rhythm:Regular Rate:Normal     Neuro/Psych negative neurological ROS  negative psych ROS   GI/Hepatic negative GI ROS, Neg liver ROS,   Endo/Other  Obesity Elevated glucose on admission Hx/o hyperlipidemia  Renal/GU negative Renal ROS  negative genitourinary   Musculoskeletal Cellulitis and Deep Abscess right lower leg with possible compartment syndrome S/P blunt trauma   Abdominal (+) + obese,   Peds  Hematology  (+) anemia ,   Anesthesia Other Findings   Reproductive/Obstetrics                             Anesthesia Physical  Anesthesia Plan  ASA: III  Anesthesia Plan: General   Post-op Pain Management:    Induction: Intravenous  PONV Risk Score and Plan: 3 and Ondansetron, Dexamethasone, Midazolam and Treatment may vary due to age or medical condition  Airway Management Planned: LMA  Additional Equipment: None  Intra-op Plan:   Post-operative Plan: Extubation in OR  Informed Consent: I have reviewed the patients History and Physical, chart, labs and discussed the procedure including the risks, benefits and alternatives for the proposed anesthesia with the patient or authorized representative who has indicated his/her understanding and acceptance.     Dental advisory given  Plan Discussed with:   Anesthesia Plan Comments:         Anesthesia Quick Evaluation

## 2020-10-13 NOTE — Progress Notes (Signed)
PT Cancellation Note  Patient Details Name: Andres [name] MRN: [id number] DOB: 03-25-1956   Cancelled Treatment:    Reason Eval/Treat Not Completed: Patient at procedure or test/unavailable Patient off unit for repeat debridement. PT will re-attempt as time allows.   Klaire Court A. Gilford Rile PT, DPT Acute Rehabilitation Services Pager [phone] Office [phone]    Linna Hoff 10/13/2020, 2:46 PM

## 2020-10-13 NOTE — Interval H&P Note (Signed)
History and Physical Interval Note:  10/13/2020 6:32 AM  Andres [name]  has presented today for surgery, with the diagnosis of Abscess Right Leg.  The various methods of treatment have been discussed with the patient and family. After consideration of risks, benefits and other options for treatment, the patient has consented to  Procedure(s): REPEAT DEBRIDEMENT RIGHT LEG, POSSIBLE SKIN GRAFT (Right) as a surgical intervention.  The patient's history has been reviewed, patient examined, no change in status, stable for surgery.  I have reviewed the patient's chart and labs.  Questions were answered to the patient's satisfaction.     Newt Minion

## 2020-10-13 NOTE — Plan of Care (Signed)
Patient is going for a repeat debridement and possible skin grafting on 2/11 in the afternoon. Patient is on ERAS protocol per pre op orders and will be on clear liquids until 10 am. Aware that he has to drink Ensure and will be NPO after 10 am. Wound vac in place to right lower extremity - drainage is bright red, clear, and it is working WNL. Patient is still on 2L of O2 at this time but no c/o SOB or difficulty breathing at this time. Will continue to monitor and continue current POC.

## 2020-10-13 NOTE — Progress Notes (Addendum)
[facility] PROGRESS NOTE    Progress Note  Andres [name]  JME:[id number] DOB: July 15, 1956 DOA: 10/05/2020 PCP: Mackie Pai, PA-C     Brief Narrative:   Andres [name] is an 65 y.o. male past medical history of hypertension comes into the ED on 10/05/2020 for worsening right lower extremity cellulitis, he had initially been on oral antibiotics but due to worsening presented to the ED MRI showed diffuse fairly marked subcutaneous soft tissue swelling and edema no evidence of abscess, with some myositis in the anterior tibial muscles.  Antibiotic regimen: Rocephin and daptomycin started on 10/06/2020  Cultured data: Negative till date  Procedures: 10/11/2020 right leg fasciotomy for compartment with excisional debridement of soft tissue muscle and fascia. 10/11/2020 wound VAC placement 10/13/2020 reintervention for debridement.  Assessment/Plan:   Cellulitis of right lower extremity with Myositis of the anterior tibial muscles:: Has remained afebrile, white blood cell count is elevated likely due to steroids. T-max of 100.2 on 10/12/2020 since then has remained afebrile. Wound VAC in place, for reintervention today on 10/13/2018 Continue daptomycinand Rocephin. 2 blood cultures was positive for Corynebacterium diphtheria I likely contaminant.  Incidental COVID-19 infection: Chest x-ray showed no infiltrates, he is currently on 2 L of oxygen is satting 98%. We will try to wean to room air. We will start treatment empirically with IV remdesivir for 3 days.  Hypovolemic hyponatremia: Resolved with IV fluid hydration.   Acute kidney injury: Likely prerenal azotemia resolved with IV fluid hydration.  Essential hypertension Continue to hold antihypertensive medication, continue hydralazine IV.   DVT prophylaxis: lovenox Family Communication:none Status is: Inpatient  Remains inpatient appropriate because:Hemodynamically unstable   Dispo: The patient is from: Home               Anticipated d/c is to: Home              Anticipated d/c date is: 3 days              Patient currently is not medically stable to d/c.   Difficult to place patient No        Code Status:     Code Status Orders  (From admission, onward)         Start     Ordered   10/05/20 2009  Full code  Continuous        10/05/20 2009        Code Status History    Date Active Date Inactive Code Status Order ID Comments User Context   11/08/2018 0939 11/09/2018 2124 Full Code [id number]  Barb Merino, MD Inpatient   Advance Care Planning Activity        IV Access:    Peripheral IV   Procedures and diagnostic studies:   No results found.   Medical Consultants:    None.   Subjective:    Andres [name] pain is controlled no complaints.   Objective:    Vitals:   10/12/20 1407 10/12/20 1549 10/12/20 1905 10/13/20 0452  BP: (!) 142/69 121/75 139/70 130/73  Pulse: 75 82 81 77  Resp: 14 16 14 18   Temp: 98.6 F (37 C) 97.9 F (36.6 C) 98.2 F (36.8 C) 98.2 F (36.8 C)  TempSrc: Oral Oral Oral Oral  SpO2: 95% 95% 97% 97%  Weight:      Height:       SpO2: 97 % O2 Flow Rate (L/min): 2 L/min   Intake/Output Summary (Last 24 hours) at 10/13/2020 1941 Last data filed at  10/13/2020 0627 Gross per 24 hour  Intake 120 ml  Output 2650 ml  Net -2530 ml   Filed Weights   10/05/20 1203 10/05/20 1430 10/05/20 1930  Weight: 107 kg 107 kg 116.9 kg    Exam: General exam: In no acute distress. Respiratory system: Good air movement and clear to auscultation. Cardiovascular system: S1 & S2 heard, RRR. No JVD. Gastrointestinal system: Abdomen is nondistended, soft and nontender.  Extremities: Wound VAC in place right leg wrapped Skin: No rashes, lesions or ulcers Psychiatry: Judgement and insight appear normal. Mood & affect appropriate.   Data Reviewed:    Labs: Basic Metabolic Panel: Recent Labs  Lab 10/08/20 0026 10/09/20 0347 10/10/20 0427  10/11/20 0235 10/12/20 0500 10/13/20 0455  NA 125* 132* 136 134*  --  134*  K 4.1 4.2 3.9 4.3  --  4.4  CL 91* 98 99 100  --  103  CO2 23 25 26 22   --  22  GLUCOSE 182* 189* 127* 83  --  91  BUN 20 24* 22 17  --  14  CREATININE 0.98 1.04 0.94 0.92 0.92 0.91  CALCIUM 7.8* 7.9* 8.3* 7.8*  --  7.7*   GFR Estimated Creatinine Clearance: 109.4 mL/min (by C-G formula based on SCr of 0.91 mg/dL). Liver Function Tests: Recent Labs  Lab 10/07/20 0525 10/08/20 0026 10/09/20 0347 10/10/20 0427  AST 34 27 22 21   ALT 45* 43 38 43  ALKPHOS 47 67 59 50  BILITOT 0.8 0.3 0.5 0.3  PROT 6.1* 6.4* 5.5* 5.8*  ALBUMIN 2.3* 2.4* 2.1* 2.2*   No results for input(s): LIPASE, AMYLASE in the last 168 hours. No results for input(s): AMMONIA in the last 168 hours. Coagulation profile No results for input(s): INR, PROTIME in the last 168 hours. COVID-19 Labs  No results for input(s): DDIMER, FERRITIN, LDH, CRP in the last 72 hours.  Lab Results  Component Value Date   SARSCOV2NAA POSITIVE (A) 10/05/2020    CBC: Recent Labs  Lab 10/07/20 0525 10/08/20 0026 10/09/20 0347 10/10/20 0427 10/11/20 0235 10/13/20 0455  WBC 10.7* 14.5* 23.3* 18.4* 18.1* 13.8*  NEUTROABS 8.9* 12.7* 19.4* 13.3* 14.8*  --   HGB 11.5* 11.4* 11.0* 11.1* 12.4* 10.4*  HCT 35.1* 34.4* 33.2* 33.9* 38.1* 32.9*  MCV 89.5 87.5 86.9 88.7 87.8 90.4  PLT 199 250 296 351 398 387   Cardiac Enzymes: Recent Labs  Lab 10/07/20 0525 10/13/20 0455  CKTOTAL 55 67   BNP (last 3 results) No results for input(s): PROBNP in the last 8760 hours. CBG: No results for input(s): GLUCAP in the last 168 hours. D-Dimer: No results for input(s): DDIMER in the last 72 hours. Hgb A1c: No results for input(s): HGBA1C in the last 72 hours. Lipid Profile: No results for input(s): CHOL, HDL, LDLCALC, TRIG, CHOLHDL, LDLDIRECT in the last 72 hours. Thyroid function studies: No results for input(s): TSH, T4TOTAL, T3FREE, THYROIDAB in the  last 72 hours.  Invalid input(s): FREET3 Anemia work up: No results for input(s): VITAMINB12, FOLATE, FERRITIN, TIBC, IRON, RETICCTPCT in the last 72 hours. Sepsis Labs: Recent Labs  Lab 10/09/20 0347 10/10/20 0427 10/11/20 0235 10/13/20 0455  WBC 23.3* 18.4* 18.1* 13.8*   Microbiology Recent Results (from the past 240 hour(s))  SARS Coronavirus 2 by RT PCR (hospital order, performed in [facility] hospital lab) Nasopharyngeal Peripheral     Status: Abnormal   Collection Time: 10/05/20  1:09 PM   Specimen: Peripheral; Nasopharyngeal  Result Value Ref  Range Status   SARS Coronavirus 2 POSITIVE (A) NEGATIVE Final    Comment: RESULT CALLED TO, READ BACK BY AND VERIFIED WITH: [place]. C, RN @ 0347 ON 10/05/2020, [email or web address].P (NOTE) SARS-CoV-2 target nucleic acids are DETECTED  SARS-CoV-2 RNA is generally detectable in upper respiratory specimens  during the acute phase of infection.  Positive results are indicative  of the presence of the identified virus, but do not rule out bacterial infection or co-infection with other pathogens not detected by the test.  Clinical correlation with patient history and  other diagnostic information is necessary to determine patient infection status.  The expected result is negative.  Fact Sheet for Patients:   StrictlyIdeas.no   Fact Sheet for Healthcare Providers:   BankingDealers.co.za    This test is not yet approved or cleared by the [place] FDA and  has been authorized for detection and/or diagnosis of SARS-CoV-2 by FDA under an Emergency Use Authorization (EUA).  This EUA will remain in effec t (meaning this test can be used) for the duration of  the COVID-19 declaration under Section 564(b)(1) of the Act, 21 U.S.C. section 360-bbb-3(b)(1), unless the authorization is terminated or revoked sooner.  Performed at [facility], [address] [place], [place]  [address]   Blood Culture (routine x 2)     Status: Abnormal   Collection Time: 10/05/20  1:09 PM   Specimen: BLOOD  Result Value Ref Range Status   Specimen Description   Final    BLOOD LEFT ANTECUBITAL Performed at [facility], [place] [address], [place], [place] [address]    Special Requests   Final    BOTTLES DRAWN AEROBIC AND ANAEROBIC Blood Culture results may not be optimal due to an inadequate volume of blood received in culture bottles Performed at [facility], [address] [place], [place] [address]    Culture  Setup Time   Final    GRAM POSITIVE RODS AEROBIC BOTTLE ONLY CRITICAL RESULT CALLED TO, READ BACK BY AND VERIFIED WITH: PHARMD N [place] [id number] AT 1530 BY CM    Culture (A)  Final    CORYNEBACTERIUM PSEUDODIPHTHERIAE Standardized susceptibility testing for this organism is not available. Performed at [facility], [place] [address] [place], [place] [address]    Report Status 10/09/2020 FINAL  Final  Blood Culture (routine x 2)     Status: None   Collection Time: 10/05/20  1:10 PM   Specimen: BLOOD RIGHT FOREARM  Result Value Ref Range Status   Specimen Description   Final    BLOOD RIGHT FOREARM Performed at [facility], [address] [place], [place] [address]    Special Requests   Final    BOTTLES DRAWN AEROBIC AND ANAEROBIC Blood Culture results may not be optimal due to an inadequate volume of blood received in culture bottles Performed at [facility], [address] [place], [place] [address]    Culture   Final    NO GROWTH 5 DAYS Performed at [facility], [place] [address], [place],  [address]    Report Status 10/10/2020 FINAL  Final  MRSA PCR Screening     Status: None   Collection Time: 10/05/20 10:35 PM   Specimen: Nasopharyngeal  Result Value Ref Range Status   MRSA by PCR NEGATIVE NEGATIVE Final    Comment:        The GeneXpert MRSA Assay (FDA approved for  NASAL specimens only), is  one component of a comprehensive MRSA colonization surveillance program. It is not intended to diagnose MRSA infection nor to guide or monitor treatment for MRSA infections. Performed at [facility], [place] [address], [place], [place] [address]   Culture, blood (routine x 2)     Status: None   Collection Time: 10/08/20 12:26 AM   Specimen: BLOOD  Result Value Ref Range Status   Specimen Description   Final    BLOOD RIGHT ANTECUBITAL Performed at [place] [address], [place], [place] [address]    Special Requests   Final    BOTTLES DRAWN AEROBIC ONLY Blood Culture results may not be optimal due to an excessive volume of blood received in culture bottles Performed at [place] [address], [place], [place] [address]    Culture   Final    NO GROWTH 5 DAYS Performed at [facility], [place] [address] [place], [place] [address]    Report Status 10/13/2020 FINAL  Final  Culture, blood (routine x 2)     Status: None   Collection Time: 10/08/20 12:26 AM   Specimen: BLOOD  Result Value Ref Range Status   Specimen Description   Final    BLOOD RIGHT HAND Performed at [place] [address], [place], [place] [address]    Special Requests   Final    BOTTLES DRAWN AEROBIC ONLY Blood Culture results may not be optimal due to an excessive volume of blood received in culture bottles Performed at [place] [address] [place], [place] [address]    Culture   Final    NO GROWTH 5 DAYS Performed at [facility], [place] [address] [place], [place] [address]    Report Status 10/13/2020 FINAL  Final  Culture, blood (routine x 2)     Status: None (Preliminary result)   Collection Time: 10/09/20 11:12 AM   Specimen: BLOOD RIGHT HAND  Result Value Ref Range Status   Specimen Description   Final    BLOOD RIGHT HAND Performed at [place] [address] [place], [place] [address]    Special Requests   Final    BOTTLES DRAWN AEROBIC ONLY Blood Culture adequate volume Performed at [place] [address] [place], [place] [address]    Culture   Final    NO GROWTH 4 DAYS Performed at [facility], [place] [address], [place], [place] [address]    Report Status PENDING  Incomplete  Culture, blood (routine x 2)     Status: None (Preliminary result)   Collection Time: 10/09/20 11:19 AM   Specimen: BLOOD  Result Value Ref Range Status   Specimen Description   Final    BLOOD LEFT ANTECUBITAL Performed at [facility] [place] [address] [place], [place] [address]    Special Requests   Final    BOTTLES DRAWN AEROBIC AND ANAEROBIC Blood Culture adequate volume Performed at [place] [address] [place], [place] [address]    Culture   Final    NO GROWTH 4 DAYS Performed at [facility], [place] [address], [place],  [address]    Report Status PENDING  Incomplete  MRSA PCR Screening     Status: None   Collection Time: 10/10/20  8:29 PM   Specimen: Nasal Mucosa; Nasopharyngeal  Result Value Ref Range  Status   MRSA by PCR NEGATIVE NEGATIVE Final    Comment:        The GeneXpert MRSA Assay (FDA approved for NASAL specimens only), is one component of a comprehensive MRSA colonization surveillance program. It is not intended to diagnose MRSA infection nor to guide or monitor treatment for MRSA infections. Performed at [facility], [place] [address] [place], [place] [address]   Aerobic/Anaerobic Culture (surgical/deep wound)     Status: None (Preliminary result)   Collection Time: 10/11/20  3:41 PM   Specimen: Soft Tissue, Other  Result Value Ref Range Status   Specimen Description TISSUE RIGHT LEG  Final   Special Requests NONE  Final   Gram Stain   Final    RARE WBC PRESENT, PREDOMINANTLY MONONUCLEAR NO ORGANISMS SEEN    Culture    Final    NO GROWTH < 12 HOURS Performed at [facility], [place] [address] [place], [place] [address]    Report Status PENDING  Incomplete     Medications:   . vitamin C  500 mg Oral Daily  . aspirin  81 mg Oral Daily  . benzonatate  200 mg Oral TID  . [START ON 10/14/2020] enoxaparin (LOVENOX) injection  60 mg Subcutaneous Q24H  . mouth rinse  15 mL Mouth Rinse BID  . sodium chloride  1 g Oral BID WC  . zinc sulfate  220 mg Oral Daily   Continuous Infusions: . sodium chloride 75 mL/hr at 10/11/20 1723  .  ceFAZolin (ANCEF) IV    . cefTRIAXone (ROCEPHIN)  IV 2 g (10/12/20 2140)  . DAPTOmycin (CUBICIN)  IV 700 mg (10/12/20 2035)      LOS: 8 days   Charlynne Cousins  [facility]  10/13/2020, 9:16 AM

## 2020-10-14 DIAGNOSIS — L02415 Cutaneous abscess of right lower limb: Secondary | ICD-10-CM | POA: Diagnosis not present

## 2020-10-14 DIAGNOSIS — U071 COVID-19: Secondary | ICD-10-CM | POA: Diagnosis not present

## 2020-10-14 DIAGNOSIS — L03115 Cellulitis of right lower limb: Secondary | ICD-10-CM | POA: Diagnosis not present

## 2020-10-14 LAB — BASIC METABOLIC PANEL
Anion gap: 8 (ref 5–15)
BUN: 15 mg/dL (ref 8–23)
CO2: 21 mmol/L — ABNORMAL LOW (ref 22–32)
Calcium: 7.8 mg/dL — ABNORMAL LOW (ref 8.9–10.3)
Chloride: 102 mmol/L (ref 98–111)
Creatinine, Ser: 0.86 mg/dL (ref 0.61–1.24)
GFR, Estimated: >60 mL/min (ref >60)
Glucose, Bld: 183 mg/dL — ABNORMAL HIGH (ref 70–99)
Potassium: 5.1 mmol/L (ref 3.5–5.1)
Sodium: 131 mmol/L — ABNORMAL LOW (ref 135–145)

## 2020-10-14 LAB — CBC WITH DIFFERENTIAL/PLATELET
Abs Immature Granulocytes: 0.76 10*3/uL — ABNORMAL HIGH (ref 0.00–0.07)
Basophils Absolute: 0 10*3/uL (ref 0.0–0.1)
Basophils Relative: 0 %
Eosinophils Absolute: 0 10*3/uL (ref 0.0–0.5)
Eosinophils Relative: 0 %
HCT: 26.2 % — ABNORMAL LOW (ref 39.0–52.0)
Hemoglobin: 8.5 g/dL — ABNORMAL LOW (ref 13.0–17.0)
Immature Granulocytes: 4 %
Lymphocytes Relative: 7 %
Lymphs Abs: 1.4 10*3/uL (ref 0.7–4.0)
MCH: 28.3 pg (ref 26.0–34.0)
MCHC: 32.4 g/dL (ref 30.0–36.0)
MCV: 87.3 fL (ref 80.0–100.0)
Monocytes Absolute: 0.6 10*3/uL (ref 0.1–1.0)
Monocytes Relative: 3 %
Neutro Abs: 15.7 10*3/uL — ABNORMAL HIGH (ref 1.7–7.7)
Neutrophils Relative %: 86 %
Platelets: 428 10*3/uL — ABNORMAL HIGH (ref 150–400)
RBC: 3 MIL/uL — ABNORMAL LOW (ref 4.22–5.81)
RDW: 14.1 % (ref 11.5–15.5)
WBC: 18.4 10*3/uL — ABNORMAL HIGH (ref 4.0–10.5)
nRBC: 0 % (ref 0.0–0.2)

## 2020-10-14 LAB — CULTURE, BLOOD (ROUTINE X 2)
Culture: NO GROWTH
Culture: NO GROWTH
Special Requests: ADEQUATE
Special Requests: ADEQUATE

## 2020-10-14 LAB — C-REACTIVE PROTEIN: CRP: 8.6 mg/dL — ABNORMAL HIGH (ref <1.0)

## 2020-10-14 LAB — SEDIMENTATION RATE: Sed Rate: 101 mm/hr — ABNORMAL HIGH (ref 0–16)

## 2020-10-14 MED ORDER — POLYETHYLENE GLYCOL 3350 17 G PO PACK
17.0000 g | PACK | Freq: Two times a day (BID) | ORAL | Status: AC
  Administered 2020-10-14: 17 g via ORAL
  Filled 2020-10-14 (×2): qty 1

## 2020-10-14 NOTE — Plan of Care (Signed)
  Problem: Education: Goal: Knowledge of General Education information will improve Description Including pain rating scale, medication(s)/side effects and non-pharmacologic comfort measures Outcome: Progressing   Problem: Health Behavior/Discharge Planning: Goal: Ability to manage health-related needs will improve Outcome: Progressing   

## 2020-10-14 NOTE — H&P (View-Only) (Signed)
Patient ID: Andres [name], male   DOB: [birth year], 65 y.o.   MRN: [id number] Patient is postoperative day 1 repeat irrigation and debridement of right leg.  From the initial irrigation and debridement 48 hours prior to yesterday's surgery patient had rapidly progressive necrosis of the fascia as well as necrosis of the muscle.  The entire anterior compartment was not viable.  The anterior compartment was excised this tissue was sent for cultures.  Approximately a quarter of the gastrocnemius and soleus muscle were also nonviable and these were excised as well.  The wound VAC is functioning well discussed with the patient that we will return to surgery tomorrow Sunday.  I discussed with the patient that limb salvage at this time is doubtful.  We will plan for an additional debridement tomorrow.

## 2020-10-14 NOTE — Plan of Care (Signed)
Patient is s/p repeat debridement with skin grafting to right leg by Dr. Sharol Given on 2/11. Patient's pain is being managed with prn meds as ordered. Wound VAC in place and WNL, still draining serosanguineous drainage. Patient refused Miralax at this time even though he has not had a BM since 2/8. Weaned patient down to 1L on nasal cannula, hoping to get him on room air by the morning. NAD or needs voiced. Will continue to monitor and continue current POC.

## 2020-10-14 NOTE — Progress Notes (Signed)
Patient ID: Andres [name], male   DOB: February 20, 1956, 65 y.o.   MRN: [id number] Patient is postoperative day 1 repeat irrigation and debridement of right leg.  From the initial irrigation and debridement 48 hours prior to yesterday's surgery patient had rapidly progressive necrosis of the fascia as well as necrosis of the muscle.  The entire anterior compartment was not viable.  The anterior compartment was excised this tissue was sent for cultures.  Approximately a quarter of the gastrocnemius and soleus muscle were also nonviable and these were excised as well.  The wound VAC is functioning well discussed with the patient that we will return to surgery tomorrow Sunday.  I discussed with the patient that limb salvage at this time is doubtful.  We will plan for an additional debridement tomorrow.

## 2020-10-14 NOTE — Progress Notes (Signed)
[facility] PROGRESS NOTE    Progress Note  Andres [name]  HKV:[id number] DOB: [birth year] DOA: 10/05/2020 PCP: Mackie Pai, PA-C     Brief Narrative:   Kadden Osterhout is an 65 y.o. male past medical history of hypertension comes into the ED on 10/05/2020 for worsening right lower extremity cellulitis, he had initially been on oral antibiotics but due to worsening presented to the ED MRI showed diffuse fairly marked subcutaneous soft tissue swelling and edema no evidence of abscess, with some myositis in the anterior tibial muscles.  Antibiotic regimen: Rocephin and daptomycin started on 10/06/2020  Cultured data: Negative till date  Procedures: 10/11/2020 right leg fasciotomy for compartment with excisional debridement of soft tissue muscle and fascia. 10/11/2020 wound VAC placement 10/13/2020 reintervention for debridement. 10/15/2020 schedule for surgical intervention  Assessment/Plan:   Cellulitis of right lower extremity with Myositis of the anterior tibial muscles:: Has remained afebrile, white blood cell count is elevated likely due to steroids. T-max of 98.5 on 10/13/2020 he has remained afebrile. Wound VAC in place for intervention on 10/15/2020 Continue daptomycin and Rocephin. 2 blood cultures was positive for Corynebacterium diphtheria I likely contaminant.  Incidental COVID-19 infection: Chest x-ray shows no infiltrate is currently chronic 1 L of oxygen to keep saturations greater 96% wean to room air. His completed his prophylactic treatment of IV remdesivir for 3 days.  Hypovolemic hyponatremia: Resolved with IV fluid hydration.   Acute kidney injury: Likely prerenal azotemia resolved with IV fluid hydration.  Essential hypertension Continue to hold antihypertensive medication, continue hydralazine IV.   DVT prophylaxis: lovenox Family Communication:none Status is: Inpatient  Remains inpatient appropriate because:Hemodynamically  unstable   Dispo: The patient is from: Home              Anticipated d/c is to: Home              Anticipated d/c date is: 3 days              Patient currently is not medically stable to d/c.   Difficult to place patient No        Code Status:     Code Status Orders  (From admission, onward)         Start     Ordered   10/05/20 2009  Full code  Continuous        10/05/20 2009        Code Status History    Date Active Date Inactive Code Status Order ID Comments User Context   11/08/2018 0939 11/09/2018 2124 Full Code [id number]  Barb Merino, MD Inpatient   Advance Care Planning Activity        IV Access:    Peripheral IV   Procedures and diagnostic studies:   No results found.   Medical Consultants:    None.   Subjective:    Andres [name] pain is controlled has not had a bowel movement.   Objective:    Vitals:   10/13/20 1944 10/14/20 0008 10/14/20 0432 10/14/20 0908  BP: (!) 155/75 (!) 154/79 (!) 132/92 129/77  Pulse: 68 80 79 70  Resp: 18 16 18 18   Temp: 97.8 F (36.6 C) 98.6 F (37 C) 98.6 F (37 C) 98.5 F (36.9 C)  TempSrc: Oral Oral Oral Oral  SpO2: 98% 96% 95% 100%  Weight:      Height:       SpO2: 100 % O2 Flow Rate (L/min): 1 L/min   Intake/Output Summary (Last 24 hours)  at 10/14/2020 0927 Last data filed at 10/14/2020 1517 Gross per 24 hour  Intake 500 ml  Output 2125 ml  Net -1625 ml   Filed Weights   10/05/20 1203 10/05/20 1430 10/05/20 1930  Weight: 107 kg 107 kg 116.9 kg    Exam: General exam: In no acute distress. Respiratory system: Good air movement and clear to auscultation. Cardiovascular system: S1 & S2 heard, RRR. No JVD. Gastrointestinal system: Abdomen is nondistended, soft and nontender.  Extremities: No pedal edema. Skin: No rashes, lesions or ulcers Psychiatry: Judgement and insight appear normal. Mood & affect appropriate.   Data Reviewed:    Labs: Basic Metabolic Panel: Recent  Labs  Lab 10/09/20 0347 10/10/20 0427 10/11/20 0235 10/12/20 0500 10/13/20 0455 10/14/20 0226  NA 132* 136 134*  --  134* 131*  K 4.2 3.9 4.3  --  4.4 5.1  CL 98 99 100  --  103 102  CO2 25 26 22   --  22 21*  GLUCOSE 189* 127* 83  --  91 183*  BUN 24* 22 17  --  14 15  CREATININE 1.04 0.94 0.92 0.92 0.91 0.86  CALCIUM 7.9* 8.3* 7.8*  --  7.7* 7.8*   GFR Estimated Creatinine Clearance: 115.8 mL/min (by C-G formula based on SCr of 0.86 mg/dL). Liver Function Tests: Recent Labs  Lab 10/08/20 0026 10/09/20 0347 10/10/20 0427  AST 27 22 21   ALT 43 38 43  ALKPHOS 67 59 50  BILITOT 0.3 0.5 0.3  PROT 6.4* 5.5* 5.8*  ALBUMIN 2.4* 2.1* 2.2*   No results for input(s): LIPASE, AMYLASE in the last 168 hours. No results for input(s): AMMONIA in the last 168 hours. Coagulation profile No results for input(s): INR, PROTIME in the last 168 hours. COVID-19 Labs  Recent Labs    10/14/20 0226  CRP 8.6*    Lab Results  Component Value Date   SARSCOV2NAA POSITIVE (A) 10/05/2020    CBC: Recent Labs  Lab 10/08/20 0026 10/09/20 0347 10/10/20 0427 10/11/20 0235 10/13/20 0455 10/14/20 0226  WBC 14.5* 23.3* 18.4* 18.1* 13.8* 18.4*  NEUTROABS 12.7* 19.4* 13.3* 14.8*  --  15.7*  HGB 11.4* 11.0* 11.1* 12.4* 10.4* 8.5*  HCT 34.4* 33.2* 33.9* 38.1* 32.9* 26.2*  MCV 87.5 86.9 88.7 87.8 90.4 87.3  PLT 250 296 351 398 387 428*   Cardiac Enzymes: Recent Labs  Lab 10/13/20 0455  CKTOTAL 67   BNP (last 3 results) No results for input(s): PROBNP in the last 8760 hours. CBG: No results for input(s): GLUCAP in the last 168 hours. D-Dimer: No results for input(s): DDIMER in the last 72 hours. Hgb A1c: No results for input(s): HGBA1C in the last 72 hours. Lipid Profile: No results for input(s): CHOL, HDL, LDLCALC, TRIG, CHOLHDL, LDLDIRECT in the last 72 hours. Thyroid function studies: No results for input(s): TSH, T4TOTAL, T3FREE, THYROIDAB in the last 72 hours.  Invalid  input(s): FREET3 Anemia work up: No results for input(s): VITAMINB12, FOLATE, FERRITIN, TIBC, IRON, RETICCTPCT in the last 72 hours. Sepsis Labs: Recent Labs  Lab 10/10/20 0427 10/11/20 0235 10/13/20 0455 10/14/20 0226  WBC 18.4* 18.1* 13.8* 18.4*   Microbiology Recent Results (from the past 240 hour(s))  SARS Coronavirus 2 by RT PCR (hospital order, performed in [facility] hospital lab) Nasopharyngeal Peripheral     Status: Abnormal   Collection Time: 10/05/20  1:09 PM   Specimen: Peripheral; Nasopharyngeal  Result Value Ref Range Status   SARS Coronavirus 2 POSITIVE (A)  NEGATIVE Final    Comment: RESULT CALLED TO, READ BACK BY AND VERIFIED WITH: [place]. C, RN @ 5102 ON 10/05/2020, [email or web address].P (NOTE) SARS-CoV-2 target nucleic acids are DETECTED  SARS-CoV-2 RNA is generally detectable in upper respiratory specimens  during the acute phase of infection.  Positive results are indicative  of the presence of the identified virus, but do not rule out bacterial infection or co-infection with other pathogens not detected by the test.  Clinical correlation with patient history and  other diagnostic information is necessary to determine patient infection status.  The expected result is negative.  Fact Sheet for Patients:   StrictlyIdeas.no   Fact Sheet for Healthcare Providers:   BankingDealers.co.za    This test is not yet approved or cleared by the [place] FDA and  has been authorized for detection and/or diagnosis of SARS-CoV-2 by FDA under an Emergency Use Authorization (EUA).  This EUA will remain in effec t (meaning this test can be used) for the duration of  the COVID-19 declaration under Section 564(b)(1) of the Act, 21 U.S.C. section 360-bbb-3(b)(1), unless the authorization is terminated or revoked sooner.  Performed at [facility], [address] [place], [place] [address]   Blood Culture  (routine x 2)     Status: Abnormal   Collection Time: 10/05/20  1:09 PM   Specimen: BLOOD  Result Value Ref Range Status   Specimen Description   Final    BLOOD LEFT ANTECUBITAL Performed at [facility], [place] [address] [place], [place] [address]    Special Requests   Final    BOTTLES DRAWN AEROBIC AND ANAEROBIC Blood Culture results may not be optimal due to an inadequate volume of blood received in culture bottles Performed at [facility], [address] [place], [place] [address]    Culture  Setup Time   Final    GRAM POSITIVE RODS AEROBIC BOTTLE ONLY CRITICAL RESULT CALLED TO, READ BACK BY AND VERIFIED WITH: PHARMD N [place] [id number] AT 1530 BY CM    Culture (A)  Final    CORYNEBACTERIUM PSEUDODIPHTHERIAE Standardized susceptibility testing for this organism is not available. Performed at [facility], [place] [address], [place], [place] [address]    Report Status 10/09/2020 FINAL  Final  Blood Culture (routine x 2)     Status: None   Collection Time: 10/05/20  1:10 PM   Specimen: BLOOD RIGHT FOREARM  Result Value Ref Range Status   Specimen Description   Final    BLOOD RIGHT FOREARM Performed at [facility], [address] [place], [place] [address]    Special Requests   Final    BOTTLES DRAWN AEROBIC AND ANAEROBIC Blood Culture results may not be optimal due to an inadequate volume of blood received in culture bottles Performed at [facility], [address] [place], [place] [address]    Culture   Final    NO GROWTH 5 DAYS Performed at [facility], [place] [address], [place], [place] [address]    Report Status 10/10/2020 FINAL  Final  MRSA PCR Screening     Status: None   Collection Time: 10/05/20 10:35 PM   Specimen: Nasopharyngeal  Result Value Ref Range Status   MRSA by PCR NEGATIVE NEGATIVE Final    Comment:        The GeneXpert MRSA Assay (FDA approved for NASAL specimens only), is one component of  a  comprehensive MRSA colonization surveillance program. It is not intended to diagnose MRSA infection nor to guide or monitor treatment for MRSA infections. Performed at [facility], [place] [address], [place], [place] [address]   Culture, blood (routine x 2)     Status: None   Collection Time: 10/08/20 12:26 AM   Specimen: BLOOD  Result Value Ref Range Status   Specimen Description   Final    BLOOD RIGHT ANTECUBITAL Performed at [place] [address], [place], [place] [address]    Special Requests   Final    BOTTLES DRAWN AEROBIC ONLY Blood Culture results may not be optimal due to an excessive volume of blood received in culture bottles Performed at [place] [address], [place], [place] [address]    Culture   Final    NO GROWTH 5 DAYS Performed at [facility], [place] [address], [place], [place] [address]    Report Status 10/13/2020 FINAL  Final  Culture, blood (routine x 2)     Status: None   Collection Time: 10/08/20 12:26 AM   Specimen: BLOOD  Result Value Ref Range Status   Specimen Description   Final    BLOOD RIGHT HAND Performed at [place] [address] [place], [place] [address]    Special Requests   Final    BOTTLES DRAWN AEROBIC ONLY Blood Culture results may not be optimal due to an excessive volume of blood received in culture bottles Performed at [place] [address] [place], [place] [address]    Culture   Final    NO GROWTH 5 DAYS Performed at [facility], [place] [address], [place], [place] [address]    Report Status 10/13/2020 FINAL  Final  Culture, blood (routine x 2)     Status: None (Preliminary result)   Collection Time: 10/09/20 11:12 AM   Specimen: BLOOD RIGHT HAND  Result Value Ref Range Status   Specimen Description   Final    BLOOD RIGHT HAND Performed at [place] [address],  [place], [place] [address]    Special Requests   Final    BOTTLES DRAWN AEROBIC ONLY Blood Culture adequate volume Performed at [place] [address], [place], [place] [address]    Culture   Final    NO GROWTH 4 DAYS Performed at [facility], [place] [address], [place], [place] [address]    Report Status PENDING  Incomplete  Culture, blood (routine x 2)     Status: None (Preliminary result)   Collection Time: 10/09/20 11:19 AM   Specimen: BLOOD  Result Value Ref Range Status   Specimen Description   Final    BLOOD LEFT ANTECUBITAL Performed at [place] [address] [place], [place] [address]    Special Requests   Final    BOTTLES DRAWN AEROBIC AND ANAEROBIC Blood Culture adequate volume Performed at [place] [address] [place], [place] [address]    Culture   Final    NO GROWTH 4 DAYS Performed at [facility], [place] [address], [place],  [address]    Report Status PENDING  Incomplete  MRSA PCR Screening     Status: None   Collection Time: 10/10/20  8:29 PM   Specimen: Nasal Mucosa; Nasopharyngeal  Result Value Ref Range Status   MRSA by PCR NEGATIVE NEGATIVE Final  Comment:        The GeneXpert MRSA Assay (FDA approved for NASAL specimens only), is one component of a comprehensive MRSA colonization surveillance program. It is not intended to diagnose MRSA infection nor to guide or monitor treatment for MRSA infections. Performed at [facility], [place] [address], [place], [place] [address]   Aerobic/Anaerobic Culture (surgical/deep wound)     Status: None (Preliminary result)   Collection Time: 10/11/20  3:41 PM   Specimen: Soft Tissue, Other  Result Value Ref Range Status   Specimen Description TISSUE RIGHT LEG  Final   Special Requests NONE  Final   Gram Stain   Final    RARE WBC PRESENT, PREDOMINANTLY MONONUCLEAR NO ORGANISMS SEEN    Culture   Final    RARE  STAPHYLOCOCCUS AUREUS NO ANAEROBES ISOLATED; CULTURE IN PROGRESS FOR 5 DAYS CRITICAL RESULT CALLED TO, READ BACK BY AND VERIFIED WITH: RN V.MYAGAI AT 1556 ON 10/13/2020 BY T.SAAD Performed at [facility], [place] [address] [place], [place] [address]    Report Status PENDING  Incomplete  Aerobic/Anaerobic Culture (surgical/deep wound)     Status: None (Preliminary result)   Collection Time: 10/13/20  2:14 PM   Specimen: Leg, Right; Tissue  Result Value Ref Range Status   Specimen Description TISSUE RIGHT LOWER LEG  Final   Special Requests RT LOWER LEG MUSCLE  Final   Gram Stain   Final    FEW WBC PRESENT, PREDOMINANTLY PMN NO ORGANISMS SEEN Performed at [facility], [place] [address] [place], [place] [address]    Culture PENDING  Incomplete   Report Status PENDING  Incomplete     Medications:   . vitamin C  500 mg Oral Daily  . aspirin  81 mg Oral Daily  . benzonatate  200 mg Oral TID  . enoxaparin (LOVENOX) injection  60 mg Subcutaneous Q24H  . mouth rinse  15 mL Mouth Rinse BID  . polyethylene glycol  17 g Oral BID  . sodium chloride  1 g Oral BID WC  . zinc sulfate  220 mg Oral Daily   Continuous Infusions: . sodium chloride 75 mL/hr at 10/11/20 1723  . sodium chloride 75 mL/hr at 10/13/20 1605  . cefTRIAXone (ROCEPHIN)  IV 2 g (10/13/20 2206)  . DAPTOmycin (CUBICIN)  IV 700 mg (10/13/20 2046)      LOS: 9 days   Charlynne Cousins  [facility]  10/14/2020, 9:27 AM

## 2020-10-15 ENCOUNTER — Inpatient Hospital Stay (HOSPITAL_COMMUNITY): Payer: Worker's Compensation | Admitting: Certified Registered Nurse Anesthetist

## 2020-10-15 ENCOUNTER — Encounter (HOSPITAL_COMMUNITY)
Admission: EM | Disposition: A | Discharge status: Rehabilitation Facility | Payer: Self-pay | Source: Home / Self Care | Attending: Internal Medicine

## 2020-10-15 DIAGNOSIS — U071 COVID-19: Secondary | ICD-10-CM | POA: Diagnosis not present

## 2020-10-15 DIAGNOSIS — M726 Necrotizing fasciitis: Secondary | ICD-10-CM | POA: Diagnosis not present

## 2020-10-15 DIAGNOSIS — L03115 Cellulitis of right lower limb: Secondary | ICD-10-CM | POA: Diagnosis not present

## 2020-10-15 DIAGNOSIS — L02415 Cutaneous abscess of right lower limb: Secondary | ICD-10-CM | POA: Diagnosis not present

## 2020-10-15 HISTORY — PX: AMPUTATION: SHX166

## 2020-10-15 LAB — BASIC METABOLIC PANEL
Anion gap: 8 (ref 5–15)
BUN: 15 mg/dL (ref 8–23)
CO2: 23 mmol/L (ref 22–32)
Calcium: 7.5 mg/dL — ABNORMAL LOW (ref 8.9–10.3)
Chloride: 103 mmol/L (ref 98–111)
Creatinine, Ser: 0.88 mg/dL (ref 0.61–1.24)
GFR, Estimated: >60 mL/min (ref >60)
Glucose, Bld: 155 mg/dL — ABNORMAL HIGH (ref 70–99)
Potassium: 4.1 mmol/L (ref 3.5–5.1)
Sodium: 134 mmol/L — ABNORMAL LOW (ref 135–145)

## 2020-10-15 LAB — SURGICAL PCR SCREEN
MRSA, PCR: NEGATIVE
Staphylococcus aureus: NEGATIVE

## 2020-10-15 LAB — CBC WITH DIFFERENTIAL/PLATELET
Abs Immature Granulocytes: 0.2 10*3/uL — ABNORMAL HIGH (ref 0.00–0.07)
Abs Immature Granulocytes: 0.24 10*3/uL — ABNORMAL HIGH (ref 0.00–0.07)
Basophils Absolute: 0 10*3/uL (ref 0.0–0.1)
Basophils Absolute: 0.1 10*3/uL (ref 0.0–0.1)
Basophils Relative: 0 %
Basophils Relative: 0 %
Eosinophils Absolute: 0.1 10*3/uL (ref 0.0–0.5)
Eosinophils Absolute: 0 10*3/uL (ref 0.0–0.5)
Eosinophils Relative: 1 %
Eosinophils Relative: 0 %
HCT: 19 % — ABNORMAL LOW (ref 39.0–52.0)
HCT: 25.5 % — ABNORMAL LOW (ref 39.0–52.0)
Hemoglobin: 6.4 g/dL — CL (ref 13.0–17.0)
Hemoglobin: 8.6 g/dL — ABNORMAL LOW (ref 13.0–17.0)
Immature Granulocytes: 2 %
Immature Granulocytes: 2 %
Lymphocytes Relative: 22 %
Lymphocytes Relative: 11 %
Lymphs Abs: 2.4 10*3/uL (ref 0.7–4.0)
Lymphs Abs: 1.5 10*3/uL (ref 0.7–4.0)
MCH: 29 pg (ref 26.0–34.0)
MCH: 27.7 pg (ref 26.0–34.0)
MCHC: 33.7 g/dL (ref 30.0–36.0)
MCHC: 33.7 g/dL (ref 30.0–36.0)
MCV: 86 fL (ref 80.0–100.0)
MCV: 82.3 fL (ref 80.0–100.0)
Monocytes Absolute: 0.7 10*3/uL (ref 0.1–1.0)
Monocytes Absolute: 0.3 10*3/uL (ref 0.1–1.0)
Monocytes Relative: 7 %
Monocytes Relative: 2 %
Neutro Abs: 7.2 10*3/uL (ref 1.7–7.7)
Neutro Abs: 12 10*3/uL — ABNORMAL HIGH (ref 1.7–7.7)
Neutrophils Relative %: 68 %
Neutrophils Relative %: 85 %
Platelets: 375 10*3/uL (ref 150–400)
Platelets: 422 10*3/uL — ABNORMAL HIGH (ref 150–400)
RBC: 2.21 MIL/uL — ABNORMAL LOW (ref 4.22–5.81)
RBC: 3.1 MIL/uL — ABNORMAL LOW (ref 4.22–5.81)
RDW: 14.2 % (ref 11.5–15.5)
RDW: 19.1 % — ABNORMAL HIGH (ref 11.5–15.5)
WBC: 10.6 10*3/uL — ABNORMAL HIGH (ref 4.0–10.5)
WBC: 14.1 10*3/uL — ABNORMAL HIGH (ref 4.0–10.5)
nRBC: 0 % (ref 0.0–0.2)
nRBC: 0 % (ref 0.0–0.2)

## 2020-10-15 LAB — ABO/RH: ABO/RH(D): A POS

## 2020-10-15 LAB — PREPARE RBC (CROSSMATCH)

## 2020-10-15 SURGERY — AMPUTATION, ABOVE KNEE
Anesthesia: General | Site: Knee | Laterality: Right

## 2020-10-15 MED ORDER — LIDOCAINE 2% (20 MG/ML) 5 ML SYRINGE
INTRAMUSCULAR | Status: AC
  Filled 2020-10-15: qty 5

## 2020-10-15 MED ORDER — LIDOCAINE 2% (20 MG/ML) 5 ML SYRINGE
INTRAMUSCULAR | Status: DC | PRN
  Administered 2020-10-15: 60 mg via INTRAVENOUS

## 2020-10-15 MED ORDER — FENTANYL CITRATE (PF) 100 MCG/2ML IJ SOLN
INTRAMUSCULAR | Status: AC
  Filled 2020-10-15: qty 2

## 2020-10-15 MED ORDER — SUGAMMADEX SODIUM 200 MG/2ML IV SOLN
INTRAVENOUS | Status: DC | PRN
  Administered 2020-10-15: 100 mg via INTRAVENOUS

## 2020-10-15 MED ORDER — ONDANSETRON HCL 4 MG PO TABS
4.0000 mg | ORAL_TABLET | Freq: Four times a day (QID) | ORAL | Status: DC | PRN
  Administered 2020-10-17: 4 mg via ORAL
  Filled 2020-10-15 (×3): qty 1

## 2020-10-15 MED ORDER — FENTANYL CITRATE (PF) 250 MCG/5ML IJ SOLN
INTRAMUSCULAR | Status: DC | PRN
  Administered 2020-10-15 (×5): 50 ug via INTRAVENOUS

## 2020-10-15 MED ORDER — ROCURONIUM BROMIDE 100 MG/10ML IV SOLN
INTRAVENOUS | Status: DC | PRN
  Administered 2020-10-15: 40 mg via INTRAVENOUS

## 2020-10-15 MED ORDER — FENTANYL CITRATE (PF) 100 MCG/2ML IJ SOLN
25.0000 ug | INTRAMUSCULAR | Status: DC | PRN
  Administered 2020-10-15: 50 ug via INTRAVENOUS
  Administered 2020-10-15: 25 ug via INTRAVENOUS

## 2020-10-15 MED ORDER — AMISULPRIDE (ANTIEMETIC) 5 MG/2ML IV SOLN: 10.0000 mg | Freq: Once | INTRAVENOUS | Status: DC | PRN

## 2020-10-15 MED ORDER — SODIUM CHLORIDE 0.9% IV SOLUTION: Freq: Once | INTRAVENOUS | Status: AC

## 2020-10-15 MED ORDER — SUCCINYLCHOLINE CHLORIDE 200 MG/10ML IV SOSY
PREFILLED_SYRINGE | INTRAVENOUS | Status: AC
  Filled 2020-10-15: qty 10

## 2020-10-15 MED ORDER — CEFAZOLIN SODIUM-DEXTROSE 2-4 GM/100ML-% IV SOLN
2.0000 g | INTRAVENOUS | Status: AC
  Administered 2020-10-15: 2 g via INTRAVENOUS

## 2020-10-15 MED ORDER — ROCURONIUM BROMIDE 10 MG/ML (PF) SYRINGE
PREFILLED_SYRINGE | INTRAVENOUS | Status: AC
  Filled 2020-10-15: qty 10

## 2020-10-15 MED ORDER — DOCUSATE SODIUM 100 MG PO CAPS
100.0000 mg | ORAL_CAPSULE | Freq: Two times a day (BID) | ORAL | Status: DC
  Administered 2020-10-15 – 2020-10-20 (×10): 100 mg via ORAL
  Filled 2020-10-15 (×10): qty 1

## 2020-10-15 MED ORDER — OXYCODONE HCL 5 MG PO TABS
10.0000 mg | ORAL_TABLET | ORAL | Status: DC | PRN
  Administered 2020-10-16 – 2020-10-18 (×9): 15 mg via ORAL
  Administered 2020-10-18 – 2020-10-19 (×2): 10 mg via ORAL
  Filled 2020-10-15: qty 3
  Filled 2020-10-15 (×2): qty 2
  Filled 2020-10-15 (×8): qty 3

## 2020-10-15 MED ORDER — HYDROMORPHONE HCL 1 MG/ML IJ SOLN
INTRAMUSCULAR | Status: DC | PRN
  Administered 2020-10-15: .5 mg via INTRAVENOUS

## 2020-10-15 MED ORDER — DEXAMETHASONE SODIUM PHOSPHATE 10 MG/ML IJ SOLN
INTRAMUSCULAR | Status: AC
  Filled 2020-10-15: qty 1

## 2020-10-15 MED ORDER — EPHEDRINE 5 MG/ML INJ
INTRAVENOUS | Status: AC
  Filled 2020-10-15: qty 10

## 2020-10-15 MED ORDER — ONDANSETRON HCL 4 MG/2ML IJ SOLN: 4.0000 mg | Freq: Four times a day (QID) | INTRAMUSCULAR | Status: DC | PRN

## 2020-10-15 MED ORDER — PROPOFOL 10 MG/ML IV BOLUS
INTRAVENOUS | Status: AC
  Filled 2020-10-15: qty 20

## 2020-10-15 MED ORDER — DEXAMETHASONE SODIUM PHOSPHATE 10 MG/ML IJ SOLN
INTRAMUSCULAR | Status: DC | PRN
  Administered 2020-10-15: 5 mg via INTRAVENOUS

## 2020-10-15 MED ORDER — ONDANSETRON HCL 4 MG/2ML IJ SOLN
INTRAMUSCULAR | Status: AC
  Filled 2020-10-15: qty 2

## 2020-10-15 MED ORDER — OXYMETAZOLINE HCL 0.05 % NA SOLN
NASAL | Status: DC | PRN
  Administered 2020-10-15: 2 via NASAL

## 2020-10-15 MED ORDER — MAGNESIUM CITRATE PO SOLN: 1.0000 | Freq: Once | ORAL | Status: DC | PRN

## 2020-10-15 MED ORDER — CHLORHEXIDINE GLUCONATE 4 % EX LIQD: 60.0000 mL | Freq: Once | CUTANEOUS | Status: DC

## 2020-10-15 MED ORDER — 0.9 % SODIUM CHLORIDE (POUR BTL) OPTIME
TOPICAL | Status: DC | PRN
  Administered 2020-10-15: 1000 mL

## 2020-10-15 MED ORDER — MIDAZOLAM HCL 2 MG/2ML IJ SOLN
INTRAMUSCULAR | Status: AC
  Filled 2020-10-15: qty 2

## 2020-10-15 MED ORDER — LACTATED RINGERS IV SOLN: INTRAVENOUS | Status: DC | PRN

## 2020-10-15 MED ORDER — BISACODYL 10 MG RE SUPP: 10.0000 mg | Freq: Every day | RECTAL | Status: DC | PRN

## 2020-10-15 MED ORDER — PHENYLEPHRINE 40 MCG/ML (10ML) SYRINGE FOR IV PUSH (FOR BLOOD PRESSURE SUPPORT)
PREFILLED_SYRINGE | INTRAVENOUS | Status: AC
  Filled 2020-10-15: qty 10

## 2020-10-15 MED ORDER — CEFAZOLIN SODIUM 1 G IJ SOLR
INTRAMUSCULAR | Status: AC
  Filled 2020-10-15: qty 20

## 2020-10-15 MED ORDER — OXYMETAZOLINE HCL 0.05 % NA SOLN
NASAL | Status: AC
  Filled 2020-10-15: qty 30

## 2020-10-15 MED ORDER — HYDROMORPHONE HCL 1 MG/ML IJ SOLN
0.5000 mg | INTRAMUSCULAR | Status: DC | PRN
  Administered 2020-10-15 – 2020-10-17 (×8): 1 mg via INTRAVENOUS
  Filled 2020-10-15 (×8): qty 1

## 2020-10-15 MED ORDER — OXYCODONE HCL 5 MG PO TABS
5.0000 mg | ORAL_TABLET | ORAL | Status: DC | PRN
  Administered 2020-10-15 (×2): 10 mg via ORAL
  Administered 2020-10-15: 5 mg via ORAL
  Administered 2020-10-16 (×2): 10 mg via ORAL
  Filled 2020-10-15: qty 1
  Filled 2020-10-15 (×4): qty 2

## 2020-10-15 MED ORDER — HYDROMORPHONE HCL 1 MG/ML IJ SOLN
INTRAMUSCULAR | Status: AC
  Filled 2020-10-15: qty 0.5

## 2020-10-15 MED ORDER — ACETAMINOPHEN 325 MG PO TABS
325.0000 mg | ORAL_TABLET | Freq: Four times a day (QID) | ORAL | Status: DC | PRN
  Administered 2020-10-17: 650 mg via ORAL
  Filled 2020-10-15 (×2): qty 2

## 2020-10-15 MED ORDER — PROPOFOL 10 MG/ML IV BOLUS
INTRAVENOUS | Status: DC | PRN
Start: 2020-10-15 — End: 2020-10-15
  Administered 2020-10-15: 150 mg via INTRAVENOUS

## 2020-10-15 MED ORDER — METOCLOPRAMIDE HCL 5 MG/ML IJ SOLN: 5.0000 mg | Freq: Three times a day (TID) | INTRAMUSCULAR | Status: DC | PRN

## 2020-10-15 MED ORDER — FENTANYL CITRATE (PF) 250 MCG/5ML IJ SOLN
INTRAMUSCULAR | Status: AC
  Filled 2020-10-15: qty 5

## 2020-10-15 MED ORDER — POVIDONE-IODINE 10 % EX SWAB: 2.0000 "application " | Freq: Once | CUTANEOUS | Status: DC

## 2020-10-15 MED ORDER — SODIUM CHLORIDE 0.9 % IV SOLN: INTRAVENOUS | Status: DC

## 2020-10-15 MED ORDER — POLYETHYLENE GLYCOL 3350 17 G PO PACK
17.0000 g | PACK | Freq: Every day | ORAL | Status: DC | PRN
  Administered 2020-10-16 – 2020-10-17 (×2): 17 g via ORAL
  Filled 2020-10-15: qty 1

## 2020-10-15 MED ORDER — METHOCARBAMOL 500 MG PO TABS
500.0000 mg | ORAL_TABLET | Freq: Four times a day (QID) | ORAL | Status: DC | PRN
  Administered 2020-10-15 – 2020-10-18 (×4): 500 mg via ORAL
  Filled 2020-10-15 (×4): qty 1

## 2020-10-15 MED ORDER — SODIUM CHLORIDE 0.9 % IR SOLN
Status: DC | PRN
  Administered 2020-10-15: 3000 mL

## 2020-10-15 MED ORDER — MIDAZOLAM HCL 2 MG/2ML IJ SOLN
INTRAMUSCULAR | Status: DC | PRN
  Administered 2020-10-15: 2 mg via INTRAVENOUS

## 2020-10-15 MED ORDER — METOCLOPRAMIDE HCL 5 MG PO TABS
5.0000 mg | ORAL_TABLET | Freq: Three times a day (TID) | ORAL | Status: DC | PRN
  Filled 2020-10-15: qty 1

## 2020-10-15 MED ORDER — METHOCARBAMOL 1000 MG/10ML IJ SOLN
500.0000 mg | Freq: Four times a day (QID) | INTRAVENOUS | Status: DC | PRN
  Filled 2020-10-15: qty 5

## 2020-10-15 SURGICAL SUPPLY — 38 items
BLADE SAW RECIP 87.9 MT (BLADE) ×2 IMPLANT
BLADE SURG 21 STRL SS (BLADE) ×3 IMPLANT
BNDG COHESIVE 6X5 TAN STRL LF (GAUZE/BANDAGES/DRESSINGS) ×2 IMPLANT
CANISTER WOUNDNEG PRESSURE 500 (CANNISTER) ×2 IMPLANT
COVER SURGICAL LIGHT HANDLE (MISCELLANEOUS) ×6 IMPLANT
DRAPE DERMATAC (DRAPES) ×4 IMPLANT
DRAPE INCISE IOBAN 66X45 STRL (DRAPES) ×6 IMPLANT
DRAPE U-SHAPE 47X51 STRL (DRAPES) ×3 IMPLANT
DRESSING PREVENA PLUS CUSTOM (GAUZE/BANDAGES/DRESSINGS) ×1 IMPLANT
DRSG PREVENA PLUS CUSTOM (GAUZE/BANDAGES/DRESSINGS) ×3
DURAPREP 26ML APPLICATOR (WOUND CARE) ×3 IMPLANT
ELECT REM PT RETURN 9FT ADLT (ELECTROSURGICAL)
ELECTRODE REM PT RTRN 9FT ADLT (ELECTROSURGICAL) IMPLANT
GLOVE BIOGEL PI IND STRL 9 (GLOVE) ×2 IMPLANT
GLOVE BIOGEL PI INDICATOR 9 (GLOVE) ×1
GLOVE SURG ORTHO 9.0 STRL STRW (GLOVE) ×5 IMPLANT
GOWN STRL REUS W/ TWL XL LVL3 (GOWN DISPOSABLE) ×4 IMPLANT
GOWN STRL REUS W/TWL XL LVL3 (GOWN DISPOSABLE) ×6
HANDPIECE INTERPULSE COAX TIP (DISPOSABLE) ×3
KIT BASIN OR (CUSTOM PROCEDURE TRAY) ×3 IMPLANT
KIT TURNOVER KIT B (KITS) ×3 IMPLANT
MANIFOLD NEPTUNE II (INSTRUMENTS) ×3 IMPLANT
NS IRRIG 1000ML POUR BTL (IV SOLUTION) ×3 IMPLANT
PACK ORTHO EXTREMITY (CUSTOM PROCEDURE TRAY) ×3 IMPLANT
PAD ARMBOARD 7.5X6 YLW CONV (MISCELLANEOUS) ×6 IMPLANT
PREVENA RESTOR AXIOFORM 29X28 (GAUZE/BANDAGES/DRESSINGS) ×2 IMPLANT
SET HNDPC FAN SPRY TIP SCT (DISPOSABLE) ×1 IMPLANT
STAPLER VISISTAT 35W (STAPLE) ×2 IMPLANT
STOCKINETTE IMPERVIOUS 9X36 MD (GAUZE/BANDAGES/DRESSINGS) IMPLANT
STOCKINETTE IMPERVIOUS LG (DRAPES) ×2 IMPLANT
SUT ETHILON 2 0 PSLX (SUTURE) ×5 IMPLANT
SUT VIC AB 1 CTX 36 (SUTURE) ×3
SUT VIC AB 1 CTX36XBRD ANBCTRL (SUTURE) ×1 IMPLANT
SWAB CULTURE ESWAB REG 1ML (MISCELLANEOUS) IMPLANT
TAPE STRIPS DRAPE STRL (GAUZE/BANDAGES/DRESSINGS) ×2 IMPLANT
TOWEL GREEN STERILE (TOWEL DISPOSABLE) ×3 IMPLANT
TUBE CONNECTING 12X1/4 (SUCTIONS) ×3 IMPLANT
YANKAUER SUCT BULB TIP NO VENT (SUCTIONS) ×3 IMPLANT

## 2020-10-15 NOTE — Progress Notes (Signed)
[facility] PROGRESS NOTE    Progress Note  Andres [name]  LPF:[id number] DOB: 01/28/1956 DOA: 10/05/2020 PCP: Mackie Pai, PA-C     Brief Narrative:   Andres [name] is an 65 y.o. male past medical history of hypertension comes into the ED on 10/05/2020 for worsening right lower extremity cellulitis, he had initially been on oral antibiotics but due to worsening presented to the ED MRI showed diffuse fairly marked subcutaneous soft tissue swelling and edema no evidence of abscess, with some myositis in the anterior tibial muscles.  Antibiotic regimen: Rocephin and daptomycin started on 10/06/2020  Cultured data: 1 out of 2 blood cultures positive for Corynebacterium-to evaluate likely a contaminant.  Procedures: 10/11/2020 right leg fasciotomy for compartment with excisional debridement of soft tissue muscle and fascia. 10/11/2020 wound VAC placement 10/13/2020 reintervention for debridement. 10/15/2020 surgical debridement and wound VAC placement  Assessment/Plan:   Cellulitis of right lower extremity with Myositis of the anterior tibial muscles:: Has remained afebrile, white blood cell count is elevated likely due to steroids. T-max of 98.9 on 10/15/2020, has remained afebrile. Status post surgical intervention on today with debridement. Continue daptomycin and Rocephin. Orthopedic surgery to dictate when to consult physical therapy and weightbearing.  Incidental COVID-19 infection: He has completed his treatment of IV remdesivir, he has been weaned to room air.  Hypovolemic hyponatremia: Resolved with IV fluid hydration.  Acute kidney injury: Likely prerenal azotemia resolved with IV fluid hydration.  Essential hypertension Continue to hold antihypertensive medication, continue hydralazine IV.   DVT prophylaxis: lovenox Family Communication:none Status is: Inpatient  Remains inpatient appropriate because:Hemodynamically unstable   Dispo: The patient is  from: Home              Anticipated d/c is to: Home              Anticipated d/c date is: 3 days              Patient currently is not medically stable to d/c.   Difficult to place patient No        Code Status:     Code Status Orders  (From admission, onward)         Start     Ordered   10/05/20 2009  Full code  Continuous        10/05/20 2009        Code Status History    Date Active Date Inactive Code Status Order ID Comments User Context   11/08/2018 0939 11/09/2018 2124 Full Code [id number]  Barb Merino, MD Inpatient   Advance Care Planning Activity        IV Access:    Peripheral IV   Procedures and diagnostic studies:   No results found.   Medical Consultants:    None.   Subjective:    Andres [name] no new complaints   Objective:    Vitals:   10/14/20 1405 10/14/20 1933 10/15/20 0424 10/15/20 0928  BP: 137/72 131/75 125/70 (!) 147/71  Pulse: 79 83 83 85  Resp: 17 18 16 17   Temp: 98.2 F (36.8 C) 98.9 F (37.2 C) 98.9 F (37.2 C) 98.2 F (36.8 C)  TempSrc: Oral Oral Oral   SpO2: 97% 98% 98% 97%  Weight:      Height:       SpO2: 97 % O2 Flow Rate (L/min): 3 L/min   Intake/Output Summary (Last 24 hours) at 10/15/2020 0943 Last data filed at 10/15/2020 0929 Gross per 24 hour  Intake  1430 ml  Output 1125 ml  Net 305 ml   Filed Weights   10/05/20 1203 10/05/20 1430 10/05/20 1930  Weight: 107 kg 107 kg 116.9 kg    Exam: General exam: In no acute distress. Respiratory system: Good air movement and clear to auscultation. Cardiovascular system: S1 & S2 heard, RRR. No JVD. Gastrointestinal system: Abdomen is nondistended, soft and nontender.  Extremities: Wound VAC in place and wrapped leg Skin: No rashes, lesions or ulcers Psychiatry: Judgement and insight appear normal. Mood & affect appropriate.  Data Reviewed:    Labs: Basic Metabolic Panel: Recent Labs  Lab 10/10/20 0427 10/11/20 0235 10/12/20 0500  10/13/20 0455 10/14/20 0226 10/15/20 0245  NA 136 134*  --  134* 131* 134*  K 3.9 4.3  --  4.4 5.1 4.1  CL 99 100  --  103 102 103  CO2 26 22  --  22 21* 23  GLUCOSE 127* 83  --  91 183* 155*  BUN 22 17  --  14 15 15   CREATININE 0.94 0.92 0.92 0.91 0.86 0.88  CALCIUM 8.3* 7.8*  --  7.7* 7.8* 7.5*   GFR Estimated Creatinine Clearance: 113.1 mL/min (by C-G formula based on SCr of 0.88 mg/dL). Liver Function Tests: Recent Labs  Lab 10/09/20 0347 10/10/20 0427  AST 22 21  ALT 38 43  ALKPHOS 59 50  BILITOT 0.5 0.3  PROT 5.5* 5.8*  ALBUMIN 2.1* 2.2*   No results for input(s): LIPASE, AMYLASE in the last 168 hours. No results for input(s): AMMONIA in the last 168 hours. Coagulation profile No results for input(s): INR, PROTIME in the last 168 hours. COVID-19 Labs  Recent Labs    10/14/20 0226  CRP 8.6*    Lab Results  Component Value Date   SARSCOV2NAA POSITIVE (A) 10/05/2020    CBC: Recent Labs  Lab 10/09/20 0347 10/10/20 0427 10/11/20 0235 10/13/20 0455 10/14/20 0226 10/15/20 0245  WBC 23.3* 18.4* 18.1* 13.8* 18.4* 10.6*  NEUTROABS 19.4* 13.3* 14.8*  --  15.7* 7.2  HGB 11.0* 11.1* 12.4* 10.4* 8.5* 6.4*  HCT 33.2* 33.9* 38.1* 32.9* 26.2* 19.0*  MCV 86.9 88.7 87.8 90.4 87.3 86.0  PLT 296 351 398 387 428* 375   Cardiac Enzymes: Recent Labs  Lab 10/13/20 0455  CKTOTAL 67   BNP (last 3 results) No results for input(s): PROBNP in the last 8760 hours. CBG: No results for input(s): GLUCAP in the last 168 hours. D-Dimer: No results for input(s): DDIMER in the last 72 hours. Hgb A1c: No results for input(s): HGBA1C in the last 72 hours. Lipid Profile: No results for input(s): CHOL, HDL, LDLCALC, TRIG, CHOLHDL, LDLDIRECT in the last 72 hours. Thyroid function studies: No results for input(s): TSH, T4TOTAL, T3FREE, THYROIDAB in the last 72 hours.  Invalid input(s): FREET3 Anemia work up: No results for input(s): VITAMINB12, FOLATE, FERRITIN, TIBC, IRON,  RETICCTPCT in the last 72 hours. Sepsis Labs: Recent Labs  Lab 10/11/20 0235 10/13/20 0455 10/14/20 0226 10/15/20 0245  WBC 18.1* 13.8* 18.4* 10.6*   Microbiology Recent Results (from the past 240 hour(s))  SARS Coronavirus 2 by RT PCR (hospital order, performed in [facility] hospital lab) Nasopharyngeal Peripheral     Status: Abnormal   Collection Time: 10/05/20  1:09 PM   Specimen: Peripheral; Nasopharyngeal  Result Value Ref Range Status   SARS Coronavirus 2 POSITIVE (A) NEGATIVE Final    Comment: RESULT CALLED TO, READ BACK BY AND VERIFIED WITH: Rainey Pines, RN @ 1452  ON 10/05/2020, [email or web address].P (NOTE) SARS-CoV-2 target nucleic acids are DETECTED  SARS-CoV-2 RNA is generally detectable in upper respiratory specimens  during the acute phase of infection.  Positive results are indicative  of the presence of the identified virus, but do not rule out bacterial infection or co-infection with other pathogens not detected by the test.  Clinical correlation with patient history and  other diagnostic information is necessary to determine patient infection status.  The expected result is negative.  Fact Sheet for Patients:   StrictlyIdeas.no   Fact Sheet for Healthcare Providers:   BankingDealers.co.za    This test is not yet approved or cleared by the [place] FDA and  has been authorized for detection and/or diagnosis of SARS-CoV-2 by FDA under an Emergency Use Authorization (EUA).  This EUA will remain in effec t (meaning this test can be used) for the duration of  the COVID-19 declaration under Section 564(b)(1) of the Act, 21 U.S.C. section 360-bbb-3(b)(1), unless the authorization is terminated or revoked sooner.  Performed at [facility], [address] [place], [place] [address]   Blood Culture (routine x 2)     Status: Abnormal   Collection Time: 10/05/20  1:09 PM   Specimen: BLOOD  Result Value  Ref Range Status   Specimen Description   Final    BLOOD LEFT ANTECUBITAL Performed at [facility], [place] [address] [place], [place] [address]    Special Requests   Final    BOTTLES DRAWN AEROBIC AND ANAEROBIC Blood Culture results may not be optimal due to an inadequate volume of blood received in culture bottles Performed at [facility], [address] [place], [place] [address]    Culture  Setup Time   Final    GRAM POSITIVE RODS AEROBIC BOTTLE ONLY CRITICAL RESULT CALLED TO, READ BACK BY AND VERIFIED WITH: PHARMD N Rodriguez Hevia [id number] AT 1530 BY CM    Culture (A)  Final    CORYNEBACTERIUM PSEUDODIPHTHERIAE Standardized susceptibility testing for this organism is not available. Performed at [facility], [place] [address] [place], [place] [address]    Report Status 10/09/2020 FINAL  Final  Blood Culture (routine x 2)     Status: None   Collection Time: 10/05/20  1:10 PM   Specimen: BLOOD RIGHT FOREARM  Result Value Ref Range Status   Specimen Description   Final    BLOOD RIGHT FOREARM Performed at [facility], [address] [place], [place] [address]    Special Requests   Final    BOTTLES DRAWN AEROBIC AND ANAEROBIC Blood Culture results may not be optimal due to an inadequate volume of blood received in culture bottles Performed at [facility], [address] [place], [place] [address]    Culture   Final    NO GROWTH 5 DAYS Performed at [facility], [place] [address], [place], [place] [address]    Report Status 10/10/2020 FINAL  Final  MRSA PCR Screening     Status: None   Collection Time: 10/05/20 10:35 PM   Specimen: Nasopharyngeal  Result Value Ref Range Status   MRSA by PCR NEGATIVE NEGATIVE Final    Comment:        The GeneXpert MRSA Assay (FDA approved for NASAL specimens only), is one component of a comprehensive MRSA colonization surveillance program. It is not intended to diagnose MRSA infection nor to  guide or monitor treatment for  MRSA infections. Performed at [facility], [place] [address] [place], [place] [address]   Culture, blood (routine x 2)     Status: None   Collection Time: 10/08/20 12:26 AM   Specimen: BLOOD  Result Value Ref Range Status   Specimen Description   Final    BLOOD RIGHT ANTECUBITAL Performed at [place] [address], [place], [place] [address]    Special Requests   Final    BOTTLES DRAWN AEROBIC ONLY Blood Culture results may not be optimal due to an excessive volume of blood received in culture bottles Performed at [place] [address] [place], [place] [address]    Culture   Final    NO GROWTH 5 DAYS Performed at [facility], [place] [address], [place], [place] [address]    Report Status 10/13/2020 FINAL  Final  Culture, blood (routine x 2)     Status: None   Collection Time: 10/08/20 12:26 AM   Specimen: BLOOD  Result Value Ref Range Status   Specimen Description   Final    BLOOD RIGHT HAND Performed at [place] [address], [place], [place] [address]    Special Requests   Final    BOTTLES DRAWN AEROBIC ONLY Blood Culture results may not be optimal due to an excessive volume of blood received in culture bottles Performed at [place] [address] [place], [place] [address]    Culture   Final    NO GROWTH 5 DAYS Performed at [facility], [place] [address] [place], [place] [address]    Report Status 10/13/2020 FINAL  Final  Culture, blood (routine x 2)     Status: None   Collection Time: 10/09/20 11:12 AM   Specimen: BLOOD RIGHT HAND  Result Value Ref Range Status   Specimen Description   Final    BLOOD RIGHT HAND Performed at [place] [address] [place], [place] [address]    Special Requests   Final    BOTTLES DRAWN AEROBIC ONLY Blood Culture adequate volume Performed at [facility] [address], [place], [place] [address]    Culture   Final    NO GROWTH 5 DAYS Performed at [facility], [place] [address] [place], [place] [address]    Report Status 10/14/2020 FINAL  Final  Culture, blood (routine x 2)     Status: None   Collection Time: 10/09/20 11:19 AM   Specimen: BLOOD  Result Value Ref Range Status   Specimen Description   Final    BLOOD LEFT ANTECUBITAL Performed at [place] [address], [place], [place] [address]    Special Requests   Final    BOTTLES DRAWN AEROBIC AND ANAEROBIC Blood Culture adequate volume Performed at [place] [address] [place], [place] [address]    Culture   Final    NO GROWTH 5 DAYS Performed at [facility], [place] [address] [place], [place] [address]    Report Status 10/14/2020 FINAL  Final  MRSA PCR Screening     Status: None   Collection Time: 10/10/20  8:29 PM   Specimen: Nasal Mucosa; Nasopharyngeal  Result Value Ref Range Status   MRSA by PCR NEGATIVE NEGATIVE Final    Comment:        The GeneXpert MRSA Assay (FDA approved for NASAL specimens only), is  one component of a comprehensive MRSA colonization surveillance program. It is not intended to diagnose MRSA infection nor to guide or monitor treatment for MRSA infections. Performed at [facility], [place] [address] [place], [place] [address]   Aerobic/Anaerobic Culture (surgical/deep wound)     Status: None (Preliminary result)   Collection Time: 10/11/20  3:41 PM   Specimen: Soft Tissue, Other  Result Value Ref Range Status   Specimen Description TISSUE RIGHT LEG  Final   Special Requests NONE  Final   Gram Stain   Final    RARE WBC PRESENT, PREDOMINANTLY MONONUCLEAR NO ORGANISMS SEEN    Culture   Final    RARE STAPHYLOCOCCUS AUREUS NO ANAEROBES ISOLATED; CULTURE IN PROGRESS FOR 5 DAYS CRITICAL RESULT CALLED TO, READ BACK BY AND VERIFIED WITH: RN V.MYAGAI AT  1556 ON 10/13/2020 BY T.SAAD SUSCEPTIBILITIES TO FOLLOW Performed at [facility], [place] [address] [place], [place] [address]    Report Status PENDING  Incomplete  Aerobic/Anaerobic Culture (surgical/deep wound)     Status: None (Preliminary result)   Collection Time: 10/13/20  2:14 PM   Specimen: Leg, Right; Tissue  Result Value Ref Range Status   Specimen Description TISSUE RIGHT LOWER LEG  Final   Special Requests RT LOWER LEG MUSCLE  Final   Gram Stain   Final    FEW WBC PRESENT, PREDOMINANTLY PMN NO ORGANISMS SEEN    Culture   Final    NO GROWTH < 24 HOURS Performed at [facility], [place] [address] [place], [place] [address]    Report Status PENDING  Incomplete  Surgical pcr screen     Status: None   Collection Time: 10/15/20  1:24 AM   Specimen: Nasal Mucosa; Nasal Swab  Result Value Ref Range Status   MRSA, PCR NEGATIVE NEGATIVE Final   Staphylococcus aureus NEGATIVE NEGATIVE Final    Comment: (NOTE) The Xpert SA Assay (FDA approved for NASAL specimens in patients 81 years of age and older), is one component of a comprehensive surveillance program. It is not intended to diagnose infection nor to guide or monitor treatment. Performed at [facility], [place] [address] [place], [place] [address]      Medications:   . [MAR Hold] vitamin C  500 mg Oral Daily  . [MAR Hold] aspirin  81 mg Oral Daily  . [MAR Hold] benzonatate  200 mg Oral TID  . chlorhexidine  60 mL Topical Once  . [MAR Hold] enoxaparin (LOVENOX) injection  60 mg Subcutaneous Q24H  . fentaNYL      . [MAR Hold] mouth rinse  15 mL Mouth Rinse BID  . [MAR Hold] polyethylene glycol  17 g Oral BID  . povidone-iodine  2 application Topical Once  . [MAR Hold] sodium chloride  1 g Oral BID WC  . [MAR Hold] zinc sulfate  220 mg Oral Daily   Continuous Infusions: . [MAR Hold] cefTRIAXone (ROCEPHIN)  IV 2 g (10/14/20 2211)  . [MAR Hold] DAPTOmycin (CUBICIN)  IV 700 mg (10/14/20 2115)       LOS: 10 days   Charlynne Cousins  [facility]  10/15/2020, 9:43 AM

## 2020-10-15 NOTE — Anesthesia Procedure Notes (Signed)
Procedure Name: Intubation Date/Time: 10/15/2020 8:15 AM Performed by: Darletta Moll, CRNA Pre-anesthesia Checklist: Patient identified, Emergency Drugs available, Suction available and Patient being monitored Patient Re-evaluated:Patient Re-evaluated prior to induction Oxygen Delivery Method: Circle system utilized Preoxygenation: Pre-oxygenation with 100% oxygen Induction Type: IV induction Ventilation: Mask ventilation without difficulty and Oral airway inserted - appropriate to patient size Laryngoscope Size: Mac and 4 Grade View: Grade I Tube type: Oral Tube size: 7.5 mm Number of attempts: 1 Airway Equipment and Method: Stylet Placement Confirmation: ETT inserted through vocal cords under direct vision,  positive ETCO2 and breath sounds checked- equal and bilateral Secured at: 22 cm Tube secured with: Tape Dental Injury: Teeth and Oropharynx as per pre-operative assessment

## 2020-10-15 NOTE — Plan of Care (Signed)
No acute changes since the previous night that I took care of him. Pain still being controlled with prn meds as ordered. Planned for surgery in the morning - patient aware and understands. Will continue to monitor and continue current POC.

## 2020-10-15 NOTE — Progress Notes (Signed)
Pt is back to the unit after surgery. Pt is A&OX4; wound vac is in place; no output. VS are taken. Pt's pain level 10/10. Call bell within reach.

## 2020-10-15 NOTE — Op Note (Signed)
10/15/2020  9:19 AM  PATIENT:  Andres [name]    PRE-OPERATIVE DIAGNOSIS:  ABSCESS RIGHT LEG  POST-OPERATIVE DIAGNOSIS:  Same  PROCEDURE:  AMPUTATION ABOVE KNEE  SURGEON:  Newt Minion, MD  PHYSICIAN ASSISTANT:None ANESTHESIA:   General  PREOPERATIVE INDICATIONS:  [name] is a  65 y.o. male with a diagnosis of ABSCESS RIGHT LEG who failed conservative measures and elected for surgical management.    The risks benefits and alternatives were discussed with the patient preoperatively including but not limited to the risks of infection, bleeding, nerve injury, cardiopulmonary complications, the need for revision surgery, among others, and the patient was willing to proceed.  OPERATIVE IMPLANTS: Praveena customizable and arthroform wound VAC  Patient received 2 units of packed red blood cells during surgery. Blood loss from surgery approximately 100 cc.  @ENCIMAGES @  OPERATIVE FINDINGS: Muscle healthy viable with good color consistency and contractility at the amputation site.  OPERATIVE PROCEDURE: Patient was brought the operating room and underwent general anesthetic.  After adequate levels anesthesia were obtained patient's right lower extremity was evaluated patient had further progressive necrotic changes the entire skin bridge over the tibia was nonviable there was progressive nonviable muscles in both the gastroc and soleus muscles as well as increased necrotic changes in the lateral compartment the anterior compartment was completely resected.  Patient complains of increasing pain in his foot and ankle.  Due to the progression of the necrotizing fasciitis it was determined to be necessary to proceed with above-the-knee amputation.  This was discussed with the patient and his wife before surgery and they both agreed to proceed if necessary with above-knee amputation at this time.  The right leg was wrapped out of the sterile field with Ioban and impervious stockinette  the right lower extremity was then prepped using DuraPrep draped into a sterile field a timeout was called.  A fishmouth incision was made just proximal to the patella this was carried down through healthy viable muscle.  The vascular bundle medially was clamped and suture ligated with 2-0 silk x2.  The remainder the amputation was completed a reciprocating saw was used to amputate through the femur.  Electrocautery was used for hemostasis the wound was then further irrigated with normal saline.  There is no signs of infection at the level amputation.  The deep and superficial fascia layers were closed using #1 Vicryl the skin was closed using 2-0 nylon and staples.  The customizable and Arnell Sieving form wound VAC was applied this had a good suction fit patient was extubated taken the PACU in stable condition.   DISCHARGE PLANNING:  Antibiotic duration: Continue IV antibiotics per infectious disease  Weightbearing: Nonweightbearing on the right  Pain medication: Opioid pathway  Dressing care/ Wound VAC: Continue wound VAC for 1 week  Ambulatory devices: Walker  Discharge to: Anticipate discharge to inpatient versus outpatient rehab.  Follow-up: In the office 1 week post operative.

## 2020-10-15 NOTE — Anesthesia Postprocedure Evaluation (Signed)
Anesthesia Post Note  Patient: Andres [name]  Procedure(s) Performed: AMPUTATION ABOVE KNEE (Right Knee)     Patient location during evaluation: PACU Anesthesia Type: General Level of consciousness: awake and alert Pain management: pain level controlled Vital Signs Assessment: post-procedure vital signs reviewed and stable Respiratory status: spontaneous breathing, nonlabored ventilation, respiratory function stable and patient connected to nasal cannula oxygen Cardiovascular status: blood pressure returned to baseline and stable Postop Assessment: no apparent nausea or vomiting Anesthetic complications: no   No complications documented.  Last Vitals:  Vitals:   10/15/20 1013 10/15/20 1046  BP: (!) 147/73 (!) 165/69  Pulse: 78 65  Resp: 17 19  Temp: 36.7 C   SpO2: 99% 99%    Last Pain:  Vitals:   10/15/20 1046  TempSrc:   PainSc: 10-Worst pain ever                 Tiajuana Amass

## 2020-10-15 NOTE — Anesthesia Preprocedure Evaluation (Signed)
Anesthesia Evaluation  Patient identified by MRN, date of birth, ID band Patient awake    Reviewed: Allergy & Precautions, NPO status , Patient's Chart, lab work & pertinent test results  Airway Mallampati: II  TM Distance: >3 FB Neck ROM: Full    Dental  (+) Dental Advisory Given   Pulmonary former smoker,    breath sounds clear to auscultation       Cardiovascular hypertension, Pt. on medications  Rhythm:Regular Rate:Normal     Neuro/Psych negative neurological ROS     GI/Hepatic negative GI ROS, Neg liver ROS,   Endo/Other  negative endocrine ROS  Renal/GU negative Renal ROS     Musculoskeletal   Abdominal   Peds  Hematology  (+) anemia ,   Anesthesia Other Findings   Reproductive/Obstetrics                             Anesthesia Physical Anesthesia Plan  ASA: IV  Anesthesia Plan: General   Post-op Pain Management:    Induction: Intravenous  PONV Risk Score and Plan: 2 and Dexamethasone, Ondansetron and Treatment may vary due to age or medical condition  Airway Management Planned: LMA  Additional Equipment:   Intra-op Plan:   Post-operative Plan: Extubation in OR  Informed Consent: I have reviewed the patients History and Physical, chart, labs and discussed the procedure including the risks, benefits and alternatives for the proposed anesthesia with the patient or authorized representative who has indicated his/her understanding and acceptance.     Dental advisory given  Plan Discussed with: CRNA  Anesthesia Plan Comments:         Anesthesia Quick Evaluation

## 2020-10-15 NOTE — Interval H&P Note (Signed)
History and Physical Interval Note:  10/15/2020 8:04 AM  Andres [name]  has presented today for surgery, with the diagnosis of ABSCESS RIGHT LEG.  The various methods of treatment have been discussed with the patient and family. After consideration of risks, benefits and other options for treatment, the patient has consented to  Procedure(s): AMPUTATION ABOVE KNEE (Right) as a surgical intervention.  The patient's history has been reviewed, patient examined, no change in status, stable for surgery.  I have reviewed the patient's chart and labs.  Questions were answered to the patient's satisfaction.     Newt Minion

## 2020-10-15 NOTE — Progress Notes (Signed)
Called the blood bank to see if the blood is ready so the first unit can be started before he goes down to pre op. They said it will be another 15 mins before the first unit is ready and that they will call when it is ready.

## 2020-10-15 NOTE — Progress Notes (Signed)
PT Cancellation Note  Patient Details Name: Andres [name] MRN: [id number] DOB: 1955-12-02   Cancelled Treatment:    Reason Eval/Treat Not Completed: Other (comment)   Politely declining PT session due to pain;   Will follow up tomorrow;   Roney Marion, [place] Pager [phone] Office [phone]    Colletta [place] 10/15/2020, 4:47 PM

## 2020-10-15 NOTE — Transfer of Care (Signed)
Immediate Anesthesia Transfer of Care Note  Patient: Andres [name]  Procedure(s) Performed: AMPUTATION ABOVE KNEE (Right Knee)  Patient Location: PACU  Anesthesia Type:General  Level of Consciousness: drowsy and patient cooperative  Airway & Oxygen Therapy: Patient Spontanous Breathing  Post-op Assessment: Report given to RN, Post -op Vital signs reviewed and stable and Patient moving all extremities X 4  Post vital signs: Reviewed and stable  Last Vitals:  Vitals Value Taken Time  BP 147/71 10/15/20 0928  Temp    Pulse 75 10/15/20 0932  Resp 17 10/15/20 0932  SpO2 92 % 10/15/20 0932  Vitals shown include unvalidated device data.  Last Pain:  Vitals:   10/15/20 0456  TempSrc:   PainSc: 5       Patients Stated Pain Goal: 0 (61/84/85 9276)  Complications: No complications documented.

## 2020-10-15 NOTE — Progress Notes (Signed)
0655 Patient going down to pre-op via bed with transport. Patient voided before going and notified his wife that he was going to preop. I was unable to initiate blood transfusion on floor since it was not ready before they came to pick up the patient. Will report off to day shift nurse.

## 2020-10-16 ENCOUNTER — Encounter (HOSPITAL_COMMUNITY): Payer: Self-pay | Admitting: Orthopedic Surgery

## 2020-10-16 DIAGNOSIS — L03115 Cellulitis of right lower limb: Secondary | ICD-10-CM | POA: Diagnosis not present

## 2020-10-16 LAB — TYPE AND SCREEN
ABO/RH(D): A POS
Antibody Screen: NEGATIVE
Unit division: 0
Unit division: 0

## 2020-10-16 LAB — BPAM RBC
Blood Product Expiration Date: 202203012359
Blood Product Expiration Date: 202203012359
ISSUE DATE / TIME: 202202130716
ISSUE DATE / TIME: 202202130716
Unit Type and Rh: 6200
Unit Type and Rh: 6200

## 2020-10-16 LAB — CBC WITH DIFFERENTIAL/PLATELET
Abs Immature Granulocytes: 0.19 10*3/uL — ABNORMAL HIGH (ref 0.00–0.07)
Basophils Absolute: 0 10*3/uL (ref 0.0–0.1)
Basophils Relative: 0 %
Eosinophils Absolute: 0 10*3/uL (ref 0.0–0.5)
Eosinophils Relative: 0 %
HCT: 22.3 % — ABNORMAL LOW (ref 39.0–52.0)
Hemoglobin: 7.7 g/dL — ABNORMAL LOW (ref 13.0–17.0)
Immature Granulocytes: 2 %
Lymphocytes Relative: 16 %
Lymphs Abs: 1.9 10*3/uL (ref 0.7–4.0)
MCH: 28.8 pg (ref 26.0–34.0)
MCHC: 34.5 g/dL (ref 30.0–36.0)
MCV: 83.5 fL (ref 80.0–100.0)
Monocytes Absolute: 0.8 10*3/uL (ref 0.1–1.0)
Monocytes Relative: 7 %
Neutro Abs: 9 10*3/uL — ABNORMAL HIGH (ref 1.7–7.7)
Neutrophils Relative %: 75 %
Platelets: 396 10*3/uL (ref 150–400)
RBC: 2.67 MIL/uL — ABNORMAL LOW (ref 4.22–5.81)
RDW: 19.2 % — ABNORMAL HIGH (ref 11.5–15.5)
WBC: 11.9 10*3/uL — ABNORMAL HIGH (ref 4.0–10.5)
nRBC: 0 % (ref 0.0–0.2)

## 2020-10-16 LAB — AEROBIC/ANAEROBIC CULTURE W GRAM STAIN (SURGICAL/DEEP WOUND)

## 2020-10-16 NOTE — Progress Notes (Signed)
Patient ID: Andres [name], male   DOB: [birth year], 65 y.o.   MRN: [id number] Patient is postoperative day 1 right above-the-knee amputation.  Patient's hemoglobin was 8.6 after surgery and after receiving 2 units of packed red blood cells.  Hemoglobin currently 7.7 white blood cell count has dropped to 11.9.  There is no drainage in the wound VAC canister.  Cultures were positive for MRSA.  Margins were clear.  Would continue IV antibiotics for 3 days postoperatively.  Anticipate patient will need inpatient versus outpatient rehab.

## 2020-10-16 NOTE — Plan of Care (Signed)
Patient is s/p right AKA on 2/13 by Dr. Sharol Given. Wound vac still in place on continuous at 125 mmHg. Patient is accepting of amputation. Pain still controlled with IV and PO prn meds as ordered. SCD to left lower exttremity, continuous pulse ox in place, currently on room air. Will continue to monitor and continue current POC.

## 2020-10-16 NOTE — Progress Notes (Signed)
Physical Therapy Treatment Patient Details Name: Andres [name] MRN: [id number] DOB: Mar 25, 1956 Today's Date: 10/16/2020    History of Present Illness 65 y.o. 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  Was also noted Covid positive on admission.  PMHx:  basal cell CA, HTN,    PT Comments    Pt was seen for mobility today, struggling with pain and fear of worsening it. Finally got partially over to side with pt using bed to upright his trunk and could not completely sit unsupported.  Due to pt lifting up the RLE off the bed, finally worked on rolling with bed pad and shifting to L side.  Pt gets that he is in need of mobility, just having a struggle with tolerance of moving RLE.  Better with meds after starting the visit, but will anticipate premedication being very useful.  Pt is motivated but will begin talking and lose track of time.  Follow Up Recommendations  SNF     Equipment Recommendations  None recommended by PT    Recommendations for Other Services       Precautions / Restrictions Precautions Precautions: Fall Precaution Comments: wound vac Restrictions Weight Bearing Restrictions: Yes RLE Weight Bearing: Non weight bearing  Premedication recommended    Mobility  Bed Mobility Overal bed mobility: Needs Assistance Bed Mobility: Supine to Sit;Sit to Supine     Supine to sit: Total assist Sit to supine: Min guard   General bed mobility comments: pt cannot move well due to pain to get OOB    Transfers                 General transfer comment: declines attempt by PT  Ambulation/Gait             General Gait Details: declines to try   Stairs             Wheelchair Mobility    Modified Rankin (Stroke Patients Only)        Balance Overall balance assessment: Needs assistance Sitting-balance support: Feet supported Sitting balance-Leahy Scale: Poor Sitting balance - Comments: could not sit up with PT helping him to sit Postural control: Posterior lean                                  Cognition Arousal/Alertness: Awake/alert;Lethargic;Suspect due to medications (after meds given) Behavior During Therapy: [facility] for tasks assessed/performed;Anxious Overall Cognitive Status: Within Functional Limits for tasks assessed                                 General Comments: pt reports being very interested in getting up to move but is not going very far in longer block of Time      Exercises General Exercises - Lower Extremity Ankle Circles/Pumps: AAROM;5 reps;Left Quad Sets: AROM;10 reps;Left Gluteal Sets: Left;Right;5 reps    General Comments General comments (skin integrity, edema, etc.): Pt was attempted to scoot to side of bed but never got both legs off bed.  Pt is becoming lethargic in session as well from pain meds, unable to focus completely      Pertinent Vitals/Pain Pain Assessment: 0-10 Pain Score: 8  Faces Pain Scale: Hurts whole lot Pain Location: RLE with movement Pain Descriptors / Indicators: Operative site guarding    Home Living                      Prior Function            PT Goals (current goals can now be found in the care plan section) Acute Rehab PT Goals Patient Stated Goal: return to independence with mobilitly PT Goal Formulation: With patient    Frequency    Min 3X/week      PT Plan Current plan remains appropriate    Co-evaluation              AM-PAC PT "6 Clicks" Mobility   Outcome Measure  Help needed turning from your back to your side while in a flat bed without using bedrails?: A Lot Help needed moving from lying on your back to sitting on the side of a flat bed without using bedrails?: A Lot Help needed  moving to and from a bed to a chair (including a wheelchair)?: A Lot Help needed standing up from a chair using your arms (e.g., wheelchair or bedside chair)?: A Lot Help needed to walk in hospital room?: A Lot Help needed climbing 3-5 steps with a railing? : A Lot 6 Click Score: 12    End of Session Equipment Utilized During Treatment: Gait belt Activity Tolerance: Patient limited by pain Patient left: in bed;with call bell/phone within reach;with bed alarm set Nurse Communication: Mobility status PT Visit Diagnosis: Difficulty in walking, not elsewhere classified (R26.2);Pain Pain - Right/Left: Right Pain - part of body: Leg     Time: 1435-1521 PT Time Calculation (min) (ACUTE ONLY): 46 min  Charges:  $Therapeutic Exercise: 8-22 mins $Therapeutic Activity: 8-22 mins                    Ramond Dial 10/16/2020, 4:41 PM  Mee Hives, PT MS Acute Rehab Dept. Number: [place] and [place]

## 2020-10-16 NOTE — Plan of Care (Signed)
  Problem: Health Behavior/Discharge Planning: Goal: Ability to manage health-related needs will improve Outcome: Progressing   Problem: Clinical Measurements: Goal: Ability to maintain clinical measurements within normal limits will improve Outcome: Progressing Goal: Will remain free from infection Outcome: Progressing Goal: Diagnostic test results will improve Outcome: Progressing   

## 2020-10-16 NOTE — Progress Notes (Signed)
[facility] PROGRESS NOTE    Progress Note  Andres [name]  VQQ:[id number] DOB: 06-30-1956 DOA: 10/05/2020 PCP: Mackie Pai, PA-C     Brief Narrative:   Andres [name] is an 65 y.o. male past medical history of hypertension comes into the ED on 10/05/2020 for worsening right lower extremity cellulitis, he had initially been on oral antibiotics but due to worsening presented to the ED MRI showed diffuse fairly marked subcutaneous soft tissue swelling and edema no evidence of abscess, with some myositis in the anterior tibial muscles.  Antibiotic regimen: Rocephin and daptomycin started on 10/06/2020  Cultured data: 1 out of 2 blood cultures positive for Corynebacterium-to evaluate likely a contaminant.  Procedures: 10/11/2020 right leg fasciotomy for compartment with excisional debridement of soft tissue muscle and fascia. 10/11/2020 wound VAC placement 10/13/2020 reintervention for debridement. 10/15/2020 status post right above-the-knee amputation  Assessment/Plan:   Cellulitis of right lower extremity with Myositis of the anterior tibial muscles:: Has remained afebrile, white blood cell count is elevated likely due to steroids. T-max of 98.7, has remained afebrile. Status post above-the-knee amputation. Continue Rocephin and daptomycin until 10/18/2021 Physical therapy has been consulted.  Incidental COVID-19 infection: He has completed his treatment of IV remdesivir, he has been weaned to room air. He has completed his isolation.  On 06/14/2021 he could come off isolation.  Hypovolemic hyponatremia: Resolved with IV fluid hydration.  Acute kidney injury: Likely prerenal azotemia resolved with IV fluid hydration.  Essential hypertension Continue to hold antihypertensive medication, continue hydralazine IV.   DVT prophylaxis: lovenox Family Communication:none Status is: Inpatient  Remains inpatient appropriate because:Hemodynamically unstable   Dispo: The  patient is from: Home              Anticipated d/c is to: Home              Anticipated d/c date is: 3 days              Patient currently is not medically stable to d/c.   Difficult to place patient No        Code Status:     Code Status Orders  (From admission, onward)         Start     Ordered   10/05/20 2009  Full code  Continuous        10/05/20 2009        Code Status History    Date Active Date Inactive Code Status Order ID Comments User Context   11/08/2018 0939 11/09/2018 2124 Full Code [id number]  Barb Merino, MD Inpatient   Advance Care Planning Activity        IV Access:    Peripheral IV   Procedures and diagnostic studies:   No results found.   Medical Consultants:    None.   Subjective:    Andres [name] relates pain is controlled no complaints.   Objective:    Vitals:   10/15/20 1947 10/16/20 0002 10/16/20 0457 10/16/20 0851  BP: 128/84 138/75 133/68 133/71  Pulse: 90 71 67 71  Resp:    18  Temp: 98.2 F (36.8 C) 98.7 F (37.1 C) 98.5 F (36.9 C) 98.2 F (36.8 C)  TempSrc: Oral Oral Oral   SpO2: 100% 95% 97% 93%  Weight:      Height:       SpO2: 93 % O2 Flow Rate (L/min): 1 L/min   Intake/Output Summary (Last 24 hours) at 10/16/2020 1030 Last data filed at 10/16/2020 0500 Gross per  24 hour  Intake 950 ml  Output 2025 ml  Net -1075 ml   Filed Weights   10/05/20 1203 10/05/20 1430 10/05/20 1930  Weight: 107 kg 107 kg 116.9 kg    Exam: General exam: In no acute distress. Respiratory system: Good air movement and clear to auscultation. Cardiovascular system: S1 & S2 heard, RRR. No JVD. Gastrointestinal system: Abdomen is nondistended, soft and nontender.  Extremities: No pedal edema. Skin: No rashes, lesions or ulcers Psychiatry: Judgement and insight appear normal. Mood & affect appropriate.  Data Reviewed:    Labs: Basic Metabolic Panel: Recent Labs  Lab 10/10/20 0427 10/11/20 0235 10/12/20 0500  10/13/20 0455 10/14/20 0226 10/15/20 0245  NA 136 134*  --  134* 131* 134*  K 3.9 4.3  --  4.4 5.1 4.1  CL 99 100  --  103 102 103  CO2 26 22  --  22 21* 23  GLUCOSE 127* 83  --  91 183* 155*  BUN 22 17  --  14 15 15   CREATININE 0.94 0.92 0.92 0.91 0.86 0.88  CALCIUM 8.3* 7.8*  --  7.7* 7.8* 7.5*   GFR Estimated Creatinine Clearance: 113.1 mL/min (by C-G formula based on SCr of 0.88 mg/dL). Liver Function Tests: Recent Labs  Lab 10/10/20 0427  AST 21  ALT 43  ALKPHOS 50  BILITOT 0.3  PROT 5.8*  ALBUMIN 2.2*   No results for input(s): LIPASE, AMYLASE in the last 168 hours. No results for input(s): AMMONIA in the last 168 hours. Coagulation profile No results for input(s): INR, PROTIME in the last 168 hours. COVID-19 Labs  Recent Labs    10/14/20 0226  CRP 8.6*    Lab Results  Component Value Date   SARSCOV2NAA POSITIVE (A) 10/05/2020    CBC: Recent Labs  Lab 10/11/20 0235 10/13/20 0455 10/14/20 0226 10/15/20 0245 10/15/20 1146 10/16/20 0343  WBC 18.1* 13.8* 18.4* 10.6* 14.1* 11.9*  NEUTROABS 14.8*  --  15.7* 7.2 12.0* 9.0*  HGB 12.4* 10.4* 8.5* 6.4* 8.6* 7.7*  HCT 38.1* 32.9* 26.2* 19.0* 25.5* 22.3*  MCV 87.8 90.4 87.3 86.0 82.3 83.5  PLT 398 387 428* 375 422* 396   Cardiac Enzymes: Recent Labs  Lab 10/13/20 0455  CKTOTAL 67   BNP (last 3 results) No results for input(s): PROBNP in the last 8760 hours. CBG: No results for input(s): GLUCAP in the last 168 hours. D-Dimer: No results for input(s): DDIMER in the last 72 hours. Hgb A1c: No results for input(s): HGBA1C in the last 72 hours. Lipid Profile: No results for input(s): CHOL, HDL, LDLCALC, TRIG, CHOLHDL, LDLDIRECT in the last 72 hours. Thyroid function studies: No results for input(s): TSH, T4TOTAL, T3FREE, THYROIDAB in the last 72 hours.  Invalid input(s): FREET3 Anemia work up: No results for input(s): VITAMINB12, FOLATE, FERRITIN, TIBC, IRON, RETICCTPCT in the last 72  hours. Sepsis Labs: Recent Labs  Lab 10/14/20 0226 10/15/20 0245 10/15/20 1146 10/16/20 0343  WBC 18.4* 10.6* 14.1* 11.9*   Microbiology Recent Results (from the past 240 hour(s))  Culture, blood (routine x 2)     Status: None   Collection Time: 10/08/20 12:26 AM   Specimen: BLOOD  Result Value Ref Range Status   Specimen Description   Final    BLOOD RIGHT ANTECUBITAL Performed at [facility], [place] [address], [place], [place] [address]    Special Requests   Final    BOTTLES DRAWN AEROBIC ONLY Blood Culture results may not be optimal  due to an excessive volume of blood received in culture bottles Performed at [place] [address], [place], [place] [address]    Culture   Final    NO GROWTH 5 DAYS Performed at [facility], [place] [address] [place], [place] [address]    Report Status 10/13/2020 FINAL  Final  Culture, blood (routine x 2)     Status: None   Collection Time: 10/08/20 12:26 AM   Specimen: BLOOD  Result Value Ref Range Status   Specimen Description   Final    BLOOD RIGHT HAND Performed at [place] [address] [place], [place] [address]    Special Requests   Final    BOTTLES DRAWN AEROBIC ONLY Blood Culture results may not be optimal due to an excessive volume of blood received in culture bottles Performed at [place] [address] [place], [place] [address]    Culture   Final    NO GROWTH 5 DAYS Performed at [facility], [place] [address] [place], [place] [address]    Report Status 10/13/2020 FINAL  Final  Culture, blood (routine x 2)     Status: None   Collection Time: 10/09/20 11:12 AM   Specimen: BLOOD RIGHT HAND  Result Value Ref Range Status   Specimen Description   Final    BLOOD RIGHT HAND Performed at [facility] [address] [place], [place] [address]    Special Requests   Final    BOTTLES DRAWN AEROBIC ONLY  Blood Culture adequate volume Performed at [facility] [address], [place], [place] [address]    Culture   Final    NO GROWTH 5 DAYS Performed at [facility], [place] [address] [place], [place] [address]    Report Status 10/14/2020 FINAL  Final  Culture, blood (routine x 2)     Status: None   Collection Time: 10/09/20 11:19 AM   Specimen: BLOOD  Result Value Ref Range Status   Specimen Description   Final    BLOOD LEFT ANTECUBITAL Performed at [facility] [place] [place] [address], [place], [place] [address]    Special Requests   Final    BOTTLES DRAWN AEROBIC AND ANAEROBIC Blood Culture adequate volume Performed at [facility] [address], [place], [place] [address]    Culture   Final    NO GROWTH 5 DAYS Performed at [facility], [place] [address] [place], [place] [address]    Report Status 10/14/2020 FINAL  Final  MRSA PCR Screening     Status: None   Collection Time: 10/10/20  8:29 PM   Specimen: Nasal Mucosa; Nasopharyngeal  Result Value Ref Range Status   MRSA by PCR NEGATIVE NEGATIVE Final    Comment:        The GeneXpert MRSA Assay (FDA approved for NASAL specimens only), is one component of a comprehensive MRSA colonization surveillance program. It is not intended to diagnose MRSA infection nor to guide or monitor treatment for MRSA infections. Performed at [facility], [place] [address] [place],  [address]   Aerobic/Anaerobic Culture (surgical/deep wound)     Status: None (Preliminary result)   Collection Time: 10/11/20  3:41 PM   Specimen: Soft Tissue, Other  Result Value Ref Range Status   Specimen Description TISSUE RIGHT LEG  Final   Special Requests NONE  Final   Gram Stain  Final    RARE WBC PRESENT, PREDOMINANTLY MONONUCLEAR NO ORGANISMS SEEN    Culture   Final    RARE METHICILLIN RESISTANT STAPHYLOCOCCUS AUREUS NO ANAEROBES ISOLATED; CULTURE IN PROGRESS FOR 5  DAYS CRITICAL RESULT CALLED TO, READ BACK BY AND VERIFIED WITH: RN V.MYAGAI AT 1556 ON 10/13/2020 BY T.SAAD Performed at [facility], [place] [address], [place], [place] [address]    Report Status PENDING  Incomplete   Organism ID, Bacteria METHICILLIN RESISTANT STAPHYLOCOCCUS AUREUS  Final      Susceptibility   Methicillin resistant staphylococcus aureus - MIC*    CIPROFLOXACIN >=8 RESISTANT Resistant     ERYTHROMYCIN >=8 RESISTANT Resistant     GENTAMICIN <=0.5 SENSITIVE Sensitive     OXACILLIN >=4 RESISTANT Resistant     TETRACYCLINE <=1 SENSITIVE Sensitive     VANCOMYCIN <=0.5 SENSITIVE Sensitive     TRIMETH/SULFA 160 RESISTANT Resistant     CLINDAMYCIN <=0.25 SENSITIVE Sensitive     RIFAMPIN <=0.5 SENSITIVE Sensitive     Inducible Clindamycin NEGATIVE Sensitive     * RARE METHICILLIN RESISTANT STAPHYLOCOCCUS AUREUS  Aerobic/Anaerobic Culture (surgical/deep wound)     Status: None (Preliminary result)   Collection Time: 10/13/20  2:14 PM   Specimen: Leg, Right; Tissue  Result Value Ref Range Status   Specimen Description TISSUE RIGHT LOWER LEG  Final   Special Requests RT LOWER LEG MUSCLE  Final   Gram Stain   Final    FEW WBC PRESENT, PREDOMINANTLY PMN NO ORGANISMS SEEN    Culture   Final    NO GROWTH 2 DAYS NO ANAEROBES ISOLATED; CULTURE IN PROGRESS FOR 5 DAYS Performed at [facility], [address], [place], [place] [address]    Report Status PENDING  Incomplete  Surgical pcr screen     Status: None   Collection Time: 10/15/20  1:24 AM   Specimen: Nasal Mucosa; Nasal Swab  Result Value Ref Range Status   MRSA, PCR NEGATIVE NEGATIVE Final   Staphylococcus aureus NEGATIVE NEGATIVE Final    Comment: (NOTE) The Xpert SA Assay (FDA approved for NASAL specimens in patients 23 years of age and older), is one component of a comprehensive surveillance program. It is not intended to diagnose infection nor to guide or monitor treatment. Performed at [facility], [place] [address] [place],  [address]      Medications:   . vitamin C  500 mg Oral Daily  . aspirin  81 mg Oral Daily  . benzonatate  200 mg Oral TID  . docusate sodium  100 mg Oral BID  . enoxaparin (LOVENOX) injection  60 mg Subcutaneous Q24H  . mouth rinse  15 mL Mouth Rinse BID  . sodium chloride  1 g Oral BID WC  . zinc sulfate  220 mg Oral Daily   Continuous Infusions: . sodium chloride 10 mL/hr at 10/15/20 1227  . cefTRIAXone (ROCEPHIN)  IV 2 g (10/15/20 2244)  . DAPTOmycin (CUBICIN)  IV 700 mg (10/15/20 2153)  . methocarbamol (ROBAXIN) IV        LOS: 11 days   Charlynne Cousins  [facility]  10/16/2020, 10:30 AM

## 2020-10-17 DIAGNOSIS — L03115 Cellulitis of right lower limb: Secondary | ICD-10-CM | POA: Diagnosis not present

## 2020-10-17 LAB — SURGICAL PATHOLOGY

## 2020-10-17 MED ORDER — HYDROMORPHONE HCL 1 MG/ML IJ SOLN
1.0000 mg | INTRAMUSCULAR | Status: DC | PRN
  Administered 2020-10-17: 1 mg via INTRAVENOUS
  Administered 2020-10-17 – 2020-10-18 (×5): 2 mg via INTRAVENOUS
  Administered 2020-10-20: 1 mg via INTRAVENOUS
  Filled 2020-10-17 (×3): qty 2
  Filled 2020-10-17: qty 1
  Filled 2020-10-17 (×2): qty 2
  Filled 2020-10-17: qty 1

## 2020-10-17 MED ORDER — HYDROMORPHONE HCL 1 MG/ML IJ SOLN
1.0000 mg | Freq: Once | INTRAMUSCULAR | Status: AC
Start: 2020-10-17 — End: 2020-10-17
  Administered 2020-10-17: 1 mg via INTRAVENOUS
  Filled 2020-10-17: qty 1

## 2020-10-17 NOTE — Progress Notes (Signed)
Patient ID: Andres [name], male   DOB: September 09, 1955, 65 y.o.   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  Patient states he is having difficulty mobilizing with therapy.  Anticipate discharge to skilled nursing.  The wound VAC has no drainage.

## 2020-10-17 NOTE — Anesthesia Postprocedure Evaluation (Signed)
Anesthesia Post Note  Patient: Andres [name]  Procedure(s) Performed: REPEAT DEBRIDEMENT RIGHT LEG (Right )     Patient location during evaluation: PACU Anesthesia Type: General Level of consciousness: sedated and patient cooperative Pain management: pain level controlled Vital Signs Assessment: post-procedure vital signs reviewed and stable Respiratory status: spontaneous breathing Cardiovascular status: stable Anesthetic complications: no   No complications documented.  Last Vitals:  Vitals:   10/17/20 0400 10/17/20 0821  BP: (!) 152/71 (!) 144/80  Pulse: 80 83  Resp: 17 18  Temp: 37.3 C 36.8 C  SpO2: 96% 99%    Last Pain:  Vitals:   10/17/20 0821  TempSrc: Oral  PainSc:                  [name]

## 2020-10-17 NOTE — TOC Progression Note (Addendum)
Transition of Care [facility]) - Progression Note    Patient Details  Name: Andres [name] MRN: [id number] Date of Birth: 05-01-1956  Transition of [facility]) CM/SW Contact  Sharin Mons, RN Phone Number: 10/17/2020, 12:22 PM  Clinical Narrative:    NCM received call from [place] , Wallene Huh [phone]), Lenna [email or web address]. NCM shared PT's evaluation/recommendations. Melissa requested clinicals regarding hospitalization. Clinical faxed to [phone]. TOC team will continue to monitor and assist with TOC needs.....   Expected Discharge Plan: [place] Barriers to Discharge: Continued Medical Work up  Expected Discharge Plan and Services Expected Discharge Plan: [place] arrangements for the past 2 months: Single Family Home                                       Social Determinants of Health (SDOH) Interventions    Readmission Risk Interventions No flowsheet data found.

## 2020-10-17 NOTE — Progress Notes (Signed)
Physical Therapy Treatment Patient Details Name: Andres [name] MRN: [id number] DOB: [birth year] Today's Date: 10/17/2020    History of Present Illness 65 y.o. 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  Was also noted Covid positive on admission.  PMHx:  basal cell CA, HTN,    PT Comments    Patient received in bed. Reports he got pain medicine prior to my arrival. Continues to report 9/10 pain at rest in R LE. Unable to tolerate any movement at all despite assistance, cues, relaxation techniques. Went out to [employer] and she was able to give him IV dilaudid. Waited 10-15 minutes and returned. Patient continued to report 8/10 pain but appeared more relaxed and able to tolerate a very little bit of movement of right LE with assist. Patient is quite anxious, very pain limited and requires increased time for all activities. Patient will continue to benefit from skilled PT while here to improve functional independence, and strength.  I am hopeful that once his pain is better managed he will be able to progress.      Follow Up Recommendations  SNF     Equipment Recommendations  None recommended by PT;Other (comment) (TBD)    Recommendations for Other Services       Precautions / Restrictions Precautions Precaution Comments: wound vac Restrictions Weight Bearing Restrictions: Yes RLE Weight Bearing: Non weight bearing    Mobility  Bed Mobility Overal bed mobility: Needs Assistance             General bed mobility comments: unable to attempt due to high pain level    Transfers                 General transfer comment: unable to attempt due to pain  Ambulation/Gait                 Stairs             Wheelchair Mobility    Modified Rankin  (Stroke Patients Only)       Balance                                            Cognition Arousal/Alertness: Awake/alert Behavior During Therapy: WFL for tasks assessed/performed Overall Cognitive Status: Within Functional Limits for tasks assessed                                 General Comments: patient is VERY pain limited. He was medicated with Oxy prior to session. Continued to report 10/10 pain. Spoke with RN, then dilaudid was given during session which seemed to help slightly. Pain level still reported at 8/10 at rest.      Exercises Other Exercises Other Exercises: L LE exercises: ap, heel slides, SLR x 10. R LE quad set, hip abd/add, SLR x 5-10 with trace to minimal movement demonstrated.    General Comments        Pertinent Vitals/Pain Pain Score: 8  Pain Location: RLE at rest Pain Descriptors / Indicators: Operative site guarding;Discomfort;Grimacing;Guarding;Moaning Pain Intervention(s): Monitored during session;RN gave pain meds during session;Premedicated before session;Utilized relaxation techniques  Home Living                      Prior Function            PT Goals (current goals can now be found in the care plan section) Acute Rehab PT Goals Patient Stated Goal: return to independence with mobilitly PT Goal Formulation: With patient Time For Goal Achievement: 10/24/20 Potential to Achieve Goals: Fair Progress towards PT goals: Not progressing toward goals - comment (patient is pain limited and unable to attempt/tolerate much movement at all)    Frequency    Min 3X/week      PT Plan Current plan remains appropriate    Co-evaluation              AM-PAC PT "6 Clicks" Mobility   Outcome Measure  Help needed turning from your back to your side while in a flat bed without using bedrails?: Total Help needed moving from lying on your back to sitting on the side of a flat bed without using  bedrails?: Total Help needed moving to and from a bed to a chair (including a wheelchair)?: Total Help needed standing up from a chair using your arms (e.g., wheelchair or bedside chair)?: Total Help needed to walk in hospital room?: Total Help needed climbing 3-5 steps with a railing? : Total 6 Click Score: 6    End of Session   Activity Tolerance: Patient limited by pain Patient left: in bed;with call bell/phone within reach;with bed alarm set Nurse Communication: Mobility status;Patient requests pain meds PT Visit Diagnosis: Other abnormalities of gait and mobility (R26.89);Pain;Muscle weakness (generalized) (M62.81) Pain - Right/Left: Right     Time: 0930 [phone] PT Time Calculation (min) (ACUTE ONLY): 20 min  Charges:  $Therapeutic Exercise: 23-37 mins                     Temesha Queener, PT, GCS 10/17/20,10:57 AM

## 2020-10-17 NOTE — Progress Notes (Signed)
[facility] PROGRESS NOTE    Progress Note  Andres [name]  NFA:[id number] DOB: [birth year] DOA: 10/05/2020 PCP: Mackie Pai, PA-C     Brief Narrative:   Andres [name] is an 65 y.o. male past medical history of hypertension comes into the ED on 10/05/2020 for worsening right lower extremity cellulitis, he had initially been on oral antibiotics but due to worsening presented to the ED MRI showed diffuse fairly marked subcutaneous soft tissue swelling and edema no evidence of abscess, with some myositis in the anterior tibial muscles.  Antibiotic regimen: Rocephin and daptomycin started on 10/06/2020  Cultured data: 1 out of 2 blood cultures positive for Corynebacterium-to evaluate likely a contaminant.  Procedures: 10/11/2020 right leg fasciotomy for compartment with excisional debridement of soft tissue muscle and fascia. 10/11/2020 wound VAC placement 10/13/2020 reintervention for debridement. 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 T-max of 98.7, has remained afebrile. Blood cultures have remained negative till date. Cultures from tissue which grew MRSA. Status post above-the-knee amputation. He will continue IV Rocephin and daptomycin until 10/18/2021 Physical therapy has been consulted skilled nursing facility.  Incidental COVID-19 infection: He has completed his treatment of IV remdesivir, he has been weaned to room air. He has completed his isolation.   He is off isolation.  Hypovolemic hyponatremia: Resolved with IV fluid hydration.  Acute kidney injury: Likely prerenal azotemia resolved with IV fluid hydration.  Essential hypertension Continue to hold antihypertensive medication, continue hydralazine IV.   DVT prophylaxis: lovenox Family  Communication:none Status is: Inpatient  Remains inpatient appropriate because:Hemodynamically unstable   Dispo: The patient is from: Home              Anticipated d/c is to: Home              Anticipated d/c date is: 3 days              Patient currently is not medically stable to d/c.   Difficult to place patient No        Code Status:     Code Status Orders  (From admission, onward)         Start     Ordered   10/05/20 2009  Full code  Continuous        10/05/20 2009        Code Status History    Date Active Date Inactive Code Status Order ID Comments User Context   11/08/2018 0939 11/09/2018 2124 Full Code [id number]  Barb Merino, MD Inpatient   Advance Care Planning Activity        IV Access:    Peripheral IV   Procedures and diagnostic studies:   No results found.   Medical Consultants:    None.   Subjective:    Andres [name] pain is controlled.   Objective:    Vitals:   10/16/20 1900 10/17/20 0000 10/17/20 0400 10/17/20 0821  BP: 138/69 131/72 (!) 152/71 (!) 144/80  Pulse: 78 80 80 83  Resp: 19 17 17 18   Temp: 99.4 F (37.4 C) 98.8 F (37.1 C) 99.2 F (37.3 C) 98.3 F (36.8 C)  TempSrc: Oral Oral Oral Oral  SpO2: 95% 97% 96% 99%  Weight:      Height:       SpO2: 99 % O2 Flow Rate (L/min): 1  L/min   Intake/Output Summary (Last 24 hours) at 10/17/2020 0952 Last data filed at 10/17/2020 0100 Gross per 24 hour  Intake 600 ml  Output 1775 ml  Net -1175 ml   Filed Weights   10/05/20 1203 10/05/20 1430 10/05/20 1930  Weight: 107 kg 107 kg 116.9 kg    Exam: General exam: In no acute distress. Respiratory system: Good air movement and clear to auscultation. Cardiovascular system: S1 & S2 heard, RRR. No JVD. Gastrointestinal system: Abdomen is nondistended, soft and nontender.  Extremities: Above-the-knee amputation, wound VAC in place Skin: No rashes, lesions or ulcers Psychiatry: Judgement and insight appear  normal. Mood & affect appropriate.  Data Reviewed:    Labs: Basic Metabolic Panel: Recent Labs  Lab 10/11/20 0235 10/12/20 0500 10/13/20 0455 10/14/20 0226 10/15/20 0245  NA 134*  --  134* 131* 134*  K 4.3  --  4.4 5.1 4.1  CL 100  --  103 102 103  CO2 22  --  22 21* 23  GLUCOSE 83  --  91 183* 155*  BUN 17  --  14 15 15   CREATININE 0.92 0.92 0.91 0.86 0.88  CALCIUM 7.8*  --  7.7* 7.8* 7.5*   GFR Estimated Creatinine Clearance: 113.1 mL/min (by C-G formula based on SCr of 0.88 mg/dL). Liver Function Tests: No results for input(s): AST, ALT, ALKPHOS, BILITOT, PROT, ALBUMIN in the last 168 hours. No results for input(s): LIPASE, AMYLASE in the last 168 hours. No results for input(s): AMMONIA in the last 168 hours. Coagulation profile No results for input(s): INR, PROTIME in the last 168 hours. COVID-19 Labs  No results for input(s): DDIMER, FERRITIN, LDH, CRP in the last 72 hours.  Lab Results  Component Value Date   SARSCOV2NAA POSITIVE (A) 10/05/2020    CBC: Recent Labs  Lab 10/11/20 0235 10/13/20 0455 10/14/20 0226 10/15/20 0245 10/15/20 1146 10/16/20 0343  WBC 18.1* 13.8* 18.4* 10.6* 14.1* 11.9*  NEUTROABS 14.8*  --  15.7* 7.2 12.0* 9.0*  HGB 12.4* 10.4* 8.5* 6.4* 8.6* 7.7*  HCT 38.1* 32.9* 26.2* 19.0* 25.5* 22.3*  MCV 87.8 90.4 87.3 86.0 82.3 83.5  PLT 398 387 428* 375 422* 396   Cardiac Enzymes: Recent Labs  Lab 10/13/20 0455  CKTOTAL 67   BNP (last 3 results) No results for input(s): PROBNP in the last 8760 hours. CBG: No results for input(s): GLUCAP in the last 168 hours. D-Dimer: No results for input(s): DDIMER in the last 72 hours. Hgb A1c: No results for input(s): HGBA1C in the last 72 hours. Lipid Profile: No results for input(s): CHOL, HDL, LDLCALC, TRIG, CHOLHDL, LDLDIRECT in the last 72 hours. Thyroid function studies: No results for input(s): TSH, T4TOTAL, T3FREE, THYROIDAB in the last 72 hours.  Invalid input(s): FREET3 Anemia  work up: No results for input(s): VITAMINB12, FOLATE, FERRITIN, TIBC, IRON, RETICCTPCT in the last 72 hours. Sepsis Labs: Recent Labs  Lab 10/14/20 0226 10/15/20 0245 10/15/20 1146 10/16/20 0343  WBC 18.4* 10.6* 14.1* 11.9*   Microbiology Recent Results (from the past 240 hour(s))  Culture, blood (routine x 2)     Status: None   Collection Time: 10/08/20 12:26 AM   Specimen: BLOOD  Result Value Ref Range Status   Specimen Description   Final    BLOOD RIGHT ANTECUBITAL Performed at [facility], [place] [address], [place], [place] [address]    Special Requests   Final    BOTTLES DRAWN AEROBIC ONLY Blood Culture results may not  be optimal due to an excessive volume of blood received in culture bottles Performed at [facility] [address] [place], [place] [address]    Culture   Final    NO GROWTH 5 DAYS Performed at [facility], [place] [address], [place], [place] [address]    Report Status 10/13/2020 FINAL  Final  Culture, blood (routine x 2)     Status: None   Collection Time: 10/08/20 12:26 AM   Specimen: BLOOD  Result Value Ref Range Status   Specimen Description   Final    BLOOD RIGHT HAND Performed at [facility] [address] [place], [place] [address]    Special Requests   Final    BOTTLES DRAWN AEROBIC ONLY Blood Culture results may not be optimal due to an excessive volume of blood received in culture bottles Performed at [facility] [address] [place], [place] [address]    Culture   Final    NO GROWTH 5 DAYS Performed at [facility], [place] [address] [place], [place] [address]    Report Status 10/13/2020 FINAL  Final  Culture, blood (routine x 2)     Status: None   Collection Time: 10/09/20 11:12 AM   Specimen: BLOOD RIGHT HAND  Result Value Ref Range Status   Specimen Description   Final    BLOOD RIGHT HAND Performed at [facility] [address] [place], [place] [address]    Special Requests   Final    BOTTLES DRAWN AEROBIC ONLY Blood Culture adequate volume Performed at [facility] [address], [place], [place] [address]    Culture   Final    NO GROWTH 5 DAYS Performed at [facility], [place] [address] [place], [place] [address]    Report Status 10/14/2020 FINAL  Final  Culture, blood (routine x 2)     Status: None   Collection Time: 10/09/20 11:19 AM   Specimen: BLOOD  Result Value Ref Range Status   Specimen Description   Final    BLOOD LEFT ANTECUBITAL Performed at [facility] [address] [place],  [address]    Special Requests   Final    BOTTLES DRAWN AEROBIC AND ANAEROBIC Blood Culture adequate volume Performed at [facility] [address] [place],  [address]    Culture   Final    NO GROWTH 5 DAYS Performed at [facility], [place] [address] [place],  [address]    Report Status 10/14/2020 FINAL  Final  MRSA PCR Screening     Status: None   Collection Time: 10/10/20  8:29 PM   Specimen: Nasal Mucosa; Nasopharyngeal  Result Value Ref Range Status   MRSA by PCR NEGATIVE NEGATIVE Final    Comment:        The GeneXpert MRSA Assay (FDA approved for NASAL specimens only), is one component of a comprehensive MRSA colonization surveillance program. It is not intended to diagnose MRSA infection nor to guide or monitor treatment for MRSA infections. Performed at [facility], [place] [address], [place],  [address]   Aerobic/Anaerobic Culture (surgical/deep wound)     Status: None   Collection Time: 10/11/20  3:41 PM   Specimen: Soft Tissue, Other  Result Value Ref Range Status   Specimen Description TISSUE RIGHT LEG  Final   Special Requests NONE  Final   Gram Stain  Final    RARE WBC PRESENT, PREDOMINANTLY MONONUCLEAR NO ORGANISMS SEEN    Culture   Final    RARE METHICILLIN RESISTANT  STAPHYLOCOCCUS AUREUS CRITICAL RESULT CALLED TO, READ BACK BY AND VERIFIED WITH: RN V.MYAGAI AT 1556 ON 10/13/2020 BY T.SAAD NO ANAEROBES ISOLATED Performed at [facility], [place] [address] [place], [place] [address]    Report Status 10/16/2020 FINAL  Final   Organism ID, Bacteria METHICILLIN RESISTANT STAPHYLOCOCCUS AUREUS  Final      Susceptibility   Methicillin resistant staphylococcus aureus - MIC*    CIPROFLOXACIN >=8 RESISTANT Resistant     ERYTHROMYCIN >=8 RESISTANT Resistant     GENTAMICIN <=0.5 SENSITIVE Sensitive     OXACILLIN >=4 RESISTANT Resistant     TETRACYCLINE <=1 SENSITIVE Sensitive     VANCOMYCIN <=0.5 SENSITIVE Sensitive     TRIMETH/SULFA 160 RESISTANT Resistant     CLINDAMYCIN <=0.25 SENSITIVE Sensitive     RIFAMPIN <=0.5 SENSITIVE Sensitive     Inducible Clindamycin NEGATIVE Sensitive     * RARE METHICILLIN RESISTANT STAPHYLOCOCCUS AUREUS  Aerobic/Anaerobic Culture (surgical/deep wound)     Status: None (Preliminary result)   Collection Time: 10/13/20  2:14 PM   Specimen: Leg, Right; Tissue  Result Value Ref Range Status   Specimen Description TISSUE RIGHT LOWER LEG  Final   Special Requests RT LOWER LEG MUSCLE  Final   Gram Stain   Final    FEW WBC PRESENT, PREDOMINANTLY PMN NO ORGANISMS SEEN    Culture   Final    NO GROWTH 3 DAYS NO ANAEROBES ISOLATED; CULTURE IN PROGRESS FOR 5 DAYS Performed at [facility], [address], [place], [place] [address]    Report Status PENDING  Incomplete  Surgical pcr screen     Status: None   Collection Time: 10/15/20  1:24 AM   Specimen: Nasal Mucosa; Nasal Swab  Result Value Ref Range Status   MRSA, PCR NEGATIVE NEGATIVE Final   Staphylococcus aureus NEGATIVE NEGATIVE Final    Comment: (NOTE) The Xpert SA Assay (FDA approved for NASAL specimens in patients 49 years of age and older), is one component of a comprehensive surveillance program. It is not intended to diagnose infection nor to guide or  monitor treatment. Performed at [facility], [place] [address] [place],  [address]      Medications:   . vitamin C  500 mg Oral Daily  . aspirin  81 mg Oral Daily  . benzonatate  200 mg Oral TID  . docusate sodium  100 mg Oral BID  . enoxaparin (LOVENOX) injection  60 mg Subcutaneous Q24H  . mouth rinse  15 mL Mouth Rinse BID  . sodium chloride  1 g Oral BID WC  . zinc sulfate  220 mg Oral Daily   Continuous Infusions: . sodium chloride 10 mL/hr at 10/16/20 2105  . cefTRIAXone (ROCEPHIN)  IV Stopped (10/17/20 0831)  . DAPTOmycin (CUBICIN)  IV Stopped (10/17/20 0830)  . methocarbamol (ROBAXIN) IV        LOS: 12 days   Charlynne Cousins  [facility]  10/17/2020, 9:52 AM

## 2020-10-18 DIAGNOSIS — L03115 Cellulitis of right lower limb: Secondary | ICD-10-CM | POA: Diagnosis not present

## 2020-10-18 LAB — AEROBIC/ANAEROBIC CULTURE W GRAM STAIN (SURGICAL/DEEP WOUND): Culture: NO GROWTH

## 2020-10-18 LAB — HEMOGLOBIN A1C
Hgb A1c MFr Bld: 5.9 % — ABNORMAL HIGH (ref 4.8–5.6)
Mean Plasma Glucose: 122.63 mg/dL

## 2020-10-18 MED ORDER — GABAPENTIN 100 MG PO CAPS
100.0000 mg | ORAL_CAPSULE | Freq: Three times a day (TID) | ORAL | Status: DC
  Administered 2020-10-18 (×3): 100 mg via ORAL
  Filled 2020-10-18 (×3): qty 1

## 2020-10-18 NOTE — NC FL2 (Signed)
[place] LEVEL OF CARE SCREENING TOOL     IDENTIFICATION  Patient Name: Andres [name] Birthdate: 17-Mar-1956 Sex: male Admission Date (Current Location): 10/05/2020  [facility] and [place] Number:  Herbalist and Address:  [facility], [place] [address], [place], [place] [address]      Provider Number: [id number]  Attending Physician Name and Address:  Barb Merino, MD  Relative Name and Phone Number:  Marliss Coots [phone]    Current Level of Care: Hospital Recommended Level of Care: [place] Prior Approval Number:    Date Approved/Denied:   PASRR Number: [id number]  Discharge Plan: SNF    Current Diagnoses: Patient Active Problem List   Diagnosis Date Noted  . Necrotizing fasciitis of lower leg ([place])   . Abscess of right lower leg   . Cellulitis 10/05/2020  . Cellulitis of right lower extremity 10/05/2020  . COVID-19 virus infection 10/05/2020  . COVID   . Hyponatremia   . Chest pain 11/08/2018  . Accelerated hypertension 11/08/2018  . Encounter to establish care 07/09/2017  . Essential hypertension 08/16/2015  . Sensorineural hearing loss (SNHL), bilateral 08/16/2015  . Impaired fasting glucose 08/23/2014  . Obesity (BMI 30-39.9) 08/23/2014  . History of nonmelanoma skin cancer 07/15/2012  . Allergic rhinitis 10/24/2011  . Basal cell carcinoma of back 07/10/2011    Orientation RESPIRATION BLADDER Height & Weight     Self,Time,Situation,Place  Normal Continent Weight: 257 lb 11.5 oz (116.9 kg) Height:  5\' 11"  (180.3 cm)  BEHAVIORAL SYMPTOMS/MOOD NEUROLOGICAL BOWEL NUTRITION STATUS      Continent (WDL) Diet (See discharge summary)  AMBULATORY STATUS COMMUNICATION OF NEEDS Skin   Limited Assist Verbally Surgical wounds,Other (Comment) (incision closed leg R, incision closed leg R,Incision closed thigh right,neg. pressure wound therapy,clean,dry,intact,dressing in place,Negative pressure wound  therapy leg anterior,R,medial,Negative pressure wound therapy,thigh,R,distal)                       Personal Care Assistance Level of Assistance  Bathing,Feeding,Dressing Bathing Assistance: Limited assistance Feeding assistance: Independent Dressing Assistance: Limited assistance     Functional Limitations Info  Sight,Hearing,Speech Sight Info: Impaired Hearing Info: Impaired Speech Info: Adequate    SPECIAL CARE FACTORS FREQUENCY  PT (By licensed PT),OT (By licensed OT)     PT Frequency: 5x min weekly OT Frequency: 5x min weekly            Contractures Contractures Info: Not present    Additional Factors Info  Code Status,Allergies Code Status Info: FULL Allergies Info: Niacin And Related           Current Medications (10/18/2020):  This is the current hospital active medication list Current Facility-Administered Medications  Medication Dose Route Frequency Provider Last Rate Last Admin  . 0.9 %  sodium chloride infusion   Intravenous Continuous Newt Minion, MD 10 mL/hr at 10/16/20 2105 New Bag at 10/16/20 2105  . acetaminophen (TYLENOL) tablet 325-650 mg  325-650 mg Oral Q6H PRN Newt Minion, MD   650 mg at 10/17/20 1240  . ascorbic acid (VITAMIN C) tablet 500 mg  500 mg Oral Daily Newt Minion, MD   500 mg at 10/18/20 9024  . aspirin chewable tablet 81 mg  81 mg Oral Daily Newt Minion, MD   81 mg at 10/18/20 0973  . benzonatate (TESSALON) capsule 200 mg  200 mg Oral TID Newt Minion, MD   200 mg at 10/18/20 5329  .  bisacodyl (DULCOLAX) suppository 10 mg  10 mg Rectal Daily PRN Newt Minion, MD      . cefTRIAXone (ROCEPHIN) 2 g in sodium chloride 0.9 % 100 mL IVPB  2 g Intravenous Q24H Charlynne Cousins, MD 200 mL/hr at 10/17/20 2021 2 g at 10/17/20 2021  . DAPTOmycin (CUBICIN) 700 mg in sodium chloride 0.9 % IVPB  700 mg Intravenous Q2000 Charlynne Cousins, MD 228 mL/hr at 10/17/20 1945 700 mg at 10/17/20 1945  . docusate sodium (COLACE)  capsule 100 mg  100 mg Oral BID Newt Minion, MD   100 mg at 10/18/20 4098  . enoxaparin (LOVENOX) injection 60 mg  60 mg Subcutaneous Q24H Newt Minion, MD   60 mg at 10/17/20 1947  . gabapentin (NEURONTIN) capsule 100 mg  100 mg Oral TID Barb Merino, MD   100 mg at 10/18/20 1124  . guaiFENesin-dextromethorphan (ROBITUSSIN DM) 100-10 MG/5ML syrup 5 mL  5 mL Oral Q6H PRN Newt Minion, MD      . hydrALAZINE (APRESOLINE) injection 10 mg  10 mg Intravenous Q4H PRN Newt Minion, MD      . HYDROmorphone (DILAUDID) injection 1-2 mg  1-2 mg Intravenous Q3H PRN Charlynne Cousins, MD   2 mg at 10/18/20 1457  . magnesium citrate solution 1 Bottle  1 Bottle Oral Once PRN Newt Minion, MD      . MEDLINE mouth rinse  15 mL Mouth Rinse BID Newt Minion, MD   15 mL at 10/18/20 0903  . methocarbamol (ROBAXIN) tablet 500 mg  500 mg Oral Q6H PRN Newt Minion, MD   500 mg at 10/17/20 1715   Or  . methocarbamol (ROBAXIN) 500 mg in dextrose 5 % 50 mL IVPB  500 mg Intravenous Q6H PRN Newt Minion, MD      . metoCLOPramide (REGLAN) tablet 5-10 mg  5-10 mg Oral Q8H PRN Newt Minion, MD       Or  . metoCLOPramide (REGLAN) injection 5-10 mg  5-10 mg Intravenous Q8H PRN Newt Minion, MD      . ondansetron [facility]) tablet 4 mg  4 mg Oral Q6H PRN Newt Minion, MD   4 mg at 10/17/20 0827   Or  . ondansetron (ZOFRAN) injection 4 mg  4 mg Intravenous Q6H PRN Newt Minion, MD      . oxyCODONE (Oxy IR/ROXICODONE) immediate release tablet 10-15 mg  10-15 mg Oral Q4H PRN Newt Minion, MD   15 mg at 10/18/20 1257  . oxyCODONE (Oxy IR/ROXICODONE) immediate release tablet 5-10 mg  5-10 mg Oral Q4H PRN Newt Minion, MD   10 mg at 10/16/20 0503  . polyethylene glycol (MIRALAX / GLYCOLAX) packet 17 g  17 g Oral Daily PRN Newt Minion, MD   17 g at 10/17/20 0826  . sodium chloride tablet 1 g  1 g Oral BID WC Newt Minion, MD   1 g at 10/18/20 1191  . zinc sulfate capsule 220 mg  220 mg Oral Daily  Newt Minion, MD   220 mg at 10/18/20 4782     Discharge Medications: Please see discharge summary for a list of discharge medications.  Relevant Imaging Results:  Relevant Lab Results:   Additional Information [id number]  Trula Ore, LCSWA

## 2020-10-18 NOTE — Progress Notes (Deleted)
Pt not hooked up to IV abx yet. He was supposed to get into shower 2 hours ago, but still has not took shower, so I am waiting until he finishes to hook him up. Pt also complaining about his pain meds, and not getting them brought to him every 4 hours. I informed him that they are PRN meds, not scheduled, and he needs to call for them when he needs them.

## 2020-10-18 NOTE — Progress Notes (Addendum)
PROGRESS NOTE    Andres [name]  QMV:[id number] DOB: [birth year] DOA: 10/05/2020 PCP: Mackie Pai, PA-C    Brief Narrative:  65 year old gentleman with history of hypertension presented to the hospital with right lower extremity cellulitis, failed outpatient therapy with antibiotics on 10/05/20.  MRI showed diffuse subcutaneous soft tissue swelling and edema with no evidence of abscess.  Treated with Rocephin and daptomycin.  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   Assessment & Plan:   Principal Problem:   Cellulitis of right lower extremity Active Problems:   Essential hypertension   Cellulitis   Hyponatremia   COVID-19 virus infection   Abscess of right lower leg   Necrotizing fasciitis of lower leg (HCC)  Necrotizing fasciitis of the right lower extremity with myositis secondary to MRSA infection: Antibiotics 2/3-2/9 2/9 right leg fasciotomy for compartment syndrome and excision of soft tissue muscle and fascia. 2/11 repeat debridement 2/13 right above-knee amputation due to significant infection present. Blood cultures 1/2 with Corynebacterium likely contaminant Wound culture with rare MRSA. Rocephin and daptomycin until 2/16 to complete 2 weeks of therapy. Remains in significant pain needing IV opiates and oral opiates for pain control. Will add gabapentin to help with phantom pain. Work with PT OT.  Will benefit with acute aggressive rehab.  Will refer to CIR.  Also referred to SNF as backup.  Incidental COVID-19 infection: Completed IV remdesivir.  Currently off isolation.  Hypovolemic hyponatremia: Improved.  Acute renal failure: Improved.  Essential hypertension: Blood pressure stable.  Acute blood loss anemia: Hemoglobin was less than 7.  Received 2 units PRBC on 2/13.  Recheck tomorrow morning to ensure stabilization.   DVT prophylaxis: SCDs Start: 10/15/20 1048 SCDs Start:  10/13/20 1550 SCDs Start: 10/11/20 1705   Code Status: Full code Family Communication: None at the bedside Disposition Plan: Status is: Inpatient  Remains inpatient appropriate because:Ongoing active pain requiring inpatient pain management   Dispo: The patient is from: Home              Anticipated d/c is to: CIR              Anticipated d/c date is: 2 days              Patient currently is not medically stable to d/c.   Difficult to place patient No         Consultants:   Orthopedics  Procedures:   Multiple procedures as above  Antimicrobials:   Rocephin and daptomycin 2/3-2/16   Subjective: Patient was seen and examined.  He was still emotionally distraught with sudden onset of events.  Has significant pain and needed IV Dilaudid before participating with therapies. Denies any nausea vomiting.  Denies chest pain or shortness of breath.  Objective: Vitals:   10/17/20 1536 10/17/20 1928 10/18/20 0549 10/18/20 0855  BP: 131/67 (!) 149/72 136/72 140/70  Pulse: 69 78 82 77  Resp: 17 15 15 16   Temp: 98.7 F (37.1 C) 98.2 F (36.8 C) 98.8 F (37.1 C) 99 F (37.2 C)  TempSrc: Oral Oral Oral Oral  SpO2: 96% 98% 96% 99%  Weight:      Height:        Intake/Output Summary (Last 24 hours) at 10/18/2020 1455 Last data filed at 10/18/2020 1258 Gross per 24 hour  Intake 480 ml  Output 3350 ml  Net -2870 ml   Filed Weights   10/05/20 1203 10/05/20 1430 10/05/20 1930  Weight: 107 kg 107 kg 116.9 kg    Examination:  General exam: Appears in mild distress, anxious with pain and acute events. Respiratory system: Clear to auscultation. Respiratory effort normal.  No added sounds. Cardiovascular system: S1 & S2 heard, RRR. No JVD, murmurs, rubs, gallops or clicks.  Gastrointestinal system: Abdomen is nondistended, soft and nontender. No organomegaly or masses felt. Normal bowel sounds heard. Central nervous system: Alert and oriented. No focal neurological  deficits. Extremities: Symmetric 5 x 5 power. Right above-knee amputation the stump with wound VAC placed, no drainage on wound VAC.   Data Reviewed: I have personally reviewed following labs and imaging studies  CBC: Recent Labs  Lab 10/13/20 0455 10/14/20 0226 10/15/20 0245 10/15/20 1146 10/16/20 0343  WBC 13.8* 18.4* 10.6* 14.1* 11.9*  NEUTROABS  --  15.7* 7.2 12.0* 9.0*  HGB 10.4* 8.5* 6.4* 8.6* 7.7*  HCT 32.9* 26.2* 19.0* 25.5* 22.3*  MCV 90.4 87.3 86.0 82.3 83.5  PLT 387 428* 375 422* 696   Basic Metabolic Panel: Recent Labs  Lab 10/12/20 0500 10/13/20 0455 10/14/20 0226 10/15/20 0245  NA  --  134* 131* 134*  K  --  4.4 5.1 4.1  CL  --  103 102 103  CO2  --  22 21* 23  GLUCOSE  --  91 183* 155*  BUN  --  14 15 15   CREATININE 0.92 0.91 0.86 0.88  CALCIUM  --  7.7* 7.8* 7.5*   GFR: Estimated Creatinine Clearance: 113.1 mL/min (by C-G formula based on SCr of 0.88 mg/dL). Liver Function Tests: No results for input(s): AST, ALT, ALKPHOS, BILITOT, PROT, ALBUMIN in the last 168 hours. No results for input(s): LIPASE, AMYLASE in the last 168 hours. No results for input(s): AMMONIA in the last 168 hours. Coagulation Profile: No results for input(s): INR, PROTIME in the last 168 hours. Cardiac Enzymes: Recent Labs  Lab 10/13/20 0455  CKTOTAL 67   BNP (last 3 results) No results for input(s): PROBNP in the last 8760 hours. HbA1C: No results for input(s): HGBA1C in the last 72 hours. CBG: No results for input(s): GLUCAP in the last 168 hours. Lipid Profile: No results for input(s): CHOL, HDL, LDLCALC, TRIG, CHOLHDL, LDLDIRECT in the last 72 hours. Thyroid Function Tests: No results for input(s): TSH, T4TOTAL, FREET4, T3FREE, THYROIDAB in the last 72 hours. Anemia Panel: No results for input(s): VITAMINB12, FOLATE, FERRITIN, TIBC, IRON, RETICCTPCT in the last 72 hours. Sepsis Labs: No results for input(s): PROCALCITON, LATICACIDVEN in the last 168  hours.  Recent Results (from the past 240 hour(s))  Culture, blood (routine x 2)     Status: None   Collection Time: 10/09/20 11:12 AM   Specimen: BLOOD RIGHT HAND  Result Value Ref Range Status   Specimen Description   Final    BLOOD RIGHT HAND Performed at [facility] [address], [place], [place] [address]    Special Requests   Final    BOTTLES DRAWN AEROBIC ONLY Blood Culture adequate volume Performed at [facility] [address] [place], [place] [address]    Culture   Final    NO GROWTH 5 DAYS Performed at [facility], [place] [address], [place], [place] [address]    Report Status 10/14/2020 FINAL  Final  Culture, blood (routine x 2)     Status: None   Collection Time: 10/09/20 11:19 AM   Specimen: BLOOD  Result Value Ref Range Status   Specimen Description  Final    BLOOD LEFT ANTECUBITAL Performed at [facility] [address], [place], [place] [address]    Special Requests   Final    BOTTLES DRAWN AEROBIC AND ANAEROBIC Blood Culture adequate volume Performed at [facility] [address] [place], [place] [address]    Culture   Final    NO GROWTH 5 DAYS Performed at [facility], [place] [address], [place], [place] [address]    Report Status 10/14/2020 FINAL  Final  MRSA PCR Screening     Status: None   Collection Time: 10/10/20  8:29 PM   Specimen: Nasal Mucosa; Nasopharyngeal  Result Value Ref Range Status   MRSA by PCR NEGATIVE NEGATIVE Final    Comment:        The GeneXpert MRSA Assay (FDA approved for NASAL specimens only), is one component of a comprehensive MRSA colonization surveillance program. It is not intended to diagnose MRSA infection nor to guide or monitor treatment for MRSA infections. Performed at [facility], [place] [address] [place], [place] [address]   Aerobic/Anaerobic Culture (surgical/deep wound)     Status: None   Collection  Time: 10/11/20  3:41 PM   Specimen: Soft Tissue, Other  Result Value Ref Range Status   Specimen Description TISSUE RIGHT LEG  Final   Special Requests NONE  Final   Gram Stain   Final    RARE WBC PRESENT, PREDOMINANTLY MONONUCLEAR NO ORGANISMS SEEN    Culture   Final    RARE METHICILLIN RESISTANT STAPHYLOCOCCUS AUREUS CRITICAL RESULT CALLED TO, READ BACK BY AND VERIFIED WITH: RN V.MYAGAI AT 8588 ON 10/13/2020 BY T.SAAD NO ANAEROBES ISOLATED Performed at [facility], [place] [address], [place], [place] [address]    Report Status 10/16/2020 FINAL  Final   Organism ID, Bacteria METHICILLIN RESISTANT STAPHYLOCOCCUS AUREUS  Final      Susceptibility   Methicillin resistant staphylococcus aureus - MIC*    CIPROFLOXACIN >=8 RESISTANT Resistant     ERYTHROMYCIN >=8 RESISTANT Resistant     GENTAMICIN <=0.5 SENSITIVE Sensitive     OXACILLIN >=4 RESISTANT Resistant     TETRACYCLINE <=1 SENSITIVE Sensitive     VANCOMYCIN <=0.5 SENSITIVE Sensitive     TRIMETH/SULFA 160 RESISTANT Resistant     CLINDAMYCIN <=0.25 SENSITIVE Sensitive     RIFAMPIN <=0.5 SENSITIVE Sensitive     Inducible Clindamycin NEGATIVE Sensitive     * RARE METHICILLIN RESISTANT STAPHYLOCOCCUS AUREUS  Aerobic/Anaerobic Culture (surgical/deep wound)     Status: None   Collection Time: 10/13/20  2:14 PM   Specimen: Leg, Right; Tissue  Result Value Ref Range Status   Specimen Description TISSUE RIGHT LOWER LEG  Final   Special Requests RT LOWER LEG MUSCLE  Final   Gram Stain   Final    FEW WBC PRESENT, PREDOMINANTLY PMN NO ORGANISMS SEEN    Culture   Final    No growth aerobically or anaerobically. Performed at [facility], [place] [address] [place], [place] [address]    Report Status 10/18/2020 FINAL  Final  Surgical pcr screen     Status: None   Collection Time: 10/15/20  1:24 AM   Specimen: Nasal Mucosa; Nasal Swab  Result Value Ref Range Status   MRSA, PCR NEGATIVE NEGATIVE Final   Staphylococcus  aureus NEGATIVE NEGATIVE Final    Comment: (NOTE) The Xpert SA Assay (FDA approved for NASAL specimens in patients 31 years of age and  older), is one component of a comprehensive surveillance program. It is not intended to diagnose infection nor to guide or monitor treatment. Performed at [facility], [place] [address] [place], [place] [address]          Radiology Studies: No results found.      Scheduled Meds: . vitamin C  500 mg Oral Daily  . aspirin  81 mg Oral Daily  . benzonatate  200 mg Oral TID  . docusate sodium  100 mg Oral BID  . enoxaparin (LOVENOX) injection  60 mg Subcutaneous Q24H  . gabapentin  100 mg Oral TID  . mouth rinse  15 mL Mouth Rinse BID  . sodium chloride  1 g Oral BID WC  . zinc sulfate  220 mg Oral Daily   Continuous Infusions: . sodium chloride 10 mL/hr at 10/16/20 2105  . cefTRIAXone (ROCEPHIN)  IV 2 g (10/17/20 2021)  . DAPTOmycin (CUBICIN)  IV 700 mg (10/17/20 1945)  . methocarbamol (ROBAXIN) IV       LOS: 13 days    Time spent: 32 minutes    Barb Merino, MD [facility] Pager [phone]

## 2020-10-18 NOTE — Progress Notes (Signed)
Physical Therapy Treatment Patient Details Name: Andres [name] MRN: [id number] DOB: 1956-05-02 Today's Date: 10/18/2020    History of Present Illness 65 y.o. 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  Was also noted Covid positive on admission.  PMHx:  basal cell CA, HTN,    PT Comments    Patient received in bed, wife present for session. Patient in better spirits today. Received pain medication 20 min prior to arrival. Patient is agreeable to PT session. He requires encouragement and increased time for all mobility. He requires min assist for supine to sit and to get scooted to edge of bed using pad. With increased time and effort patient is able to lateral scoot to drop arm recliner with +2 min guard/supervision for safety. Cues needed for breathing, safety and technique. Patient very pleased with progress today and is motivated to improve. He will benefit from intensive therapy to achieve functional independence.        Follow Up Recommendations  CIR;SNF;Supervision for mobility/OOB- if cannot go to CIR, then SNF     Equipment Recommendations  Other (comment) (TBD)    Recommendations for Other Services Rehab consult     Precautions / Restrictions Precautions Precautions: Fall Restrictions Weight Bearing Restrictions: Yes RLE Weight Bearing: Non weight bearing    Mobility  Bed Mobility Overal bed mobility: Needs Assistance Bed Mobility: Supine to Sit     Supine to sit: Min assist;+2 for safety/equipment     General bed mobility comments: heavy use of rails, increased time    Transfers Overall transfer level: Needs assistance Equipment used: None Transfers: Lateral/Scoot Transfers          Lateral/Scoot Transfers: Min assist;From elevated surface;+2  safety/equipment General transfer comment: increased time and effort with cues and supervision for safety. Requires encouragement, relaxation technique cues.  Ambulation/Gait             General Gait Details: unable   Stairs             Wheelchair Mobility    Modified Rankin (Stroke Patients Only)       Balance Overall balance assessment: Needs assistance Sitting-balance support: Feet supported Sitting balance-Leahy Scale: Fair Sitting balance - Comments: able to sit with supervision Postural control: Left lateral lean                                  Cognition Arousal/Alertness: Awake/alert Behavior During Therapy: WFL for tasks assessed/performed Overall Cognitive Status: Within Functional Limits for tasks assessed                                 General Comments: Patient with improved pain tolerance this session. Continues to require increased time for mobility, but much improved from yesterday      Exercises Amputee Exercises Quad Sets: AROM;Right;5 reps Hip ABduction/ADduction: AROM;Right;5 reps Straight Leg Raises: AROM;5 reps;Right    General Comments        Pertinent Vitals/Pain Pain Assessment: Faces Faces Pain Scale: Hurts even more Pain Location: R LE with movement Pain Descriptors / Indicators: Operative site guarding;Discomfort;Grimacing;Guarding;Moaning Pain Intervention(s): Limited activity within patient's tolerance;Monitored during session;Repositioned;Premedicated before session    Home  Living                      Prior Function            PT Goals (current goals can now be found in the care plan section) Acute Rehab PT Goals Patient Stated Goal: return to independence with mobilitly PT Goal Formulation: With patient Time For Goal Achievement: 10/24/20 Potential to Achieve Goals: Fair Progress towards PT goals: Progressing toward goals    Frequency    Min 3X/week      PT Plan  Discharge plan needs to be updated    Co-evaluation              AM-PAC PT "6 Clicks" Mobility   Outcome Measure  Help needed turning from your back to your side while in a flat bed without using bedrails?: A Lot Help needed moving from lying on your back to sitting on the side of a flat bed without using bedrails?: A Lot Help needed moving to and from a bed to a chair (including a wheelchair)?: A Little Help needed standing up from a chair using your arms (e.g., wheelchair or bedside chair)?: Total Help needed to walk in hospital room?: Total Help needed climbing 3-5 steps with a railing? : Total 6 Click Score: 10    End of Session   Activity Tolerance: Patient limited by pain;Patient limited by fatigue Patient left: in chair;with call bell/phone within reach;with family/visitor present Nurse Communication: Mobility status PT Visit Diagnosis: Other abnormalities of gait and mobility (R26.89);Pain;Muscle weakness (generalized) (M62.81) Pain - Right/Left: Right Pain - part of body: Leg     Time: 1700-1749 PT Time Calculation (min) (ACUTE ONLY): 37 min  Charges:  $Therapeutic Exercise: 23-37 mins $Therapeutic Activity: 23-37 mins                     Alida Greiner, PT, GCS 10/18/20,2:33 PM

## 2020-10-18 NOTE — Evaluation (Signed)
Occupational Therapy Evaluation Patient Details Name: Andres [name] MRN: [id number] DOB: [birth year] Today's Date: 10/18/2020    History of Present Illness 65 y.o. 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  Was also noted Covid positive on admission.  PMHx:  basal cell CA, HTN,   Clinical Impression   Pt presents with decline in function and safety with ADLs and ADL mobility with impaired strength, balance and endurance. Pt limited by pain and was pre medicated before session. Pt would benefit from acute OT services to address impairments to maximize level of function and safety    Follow Up Recommendations  CIR;Other (comment) (if cannot go to CIR, then SNF)    Equipment Recommendations       Recommendations for Other Services Rehab consult     Precautions / Restrictions Precautions Precautions: Fall Restrictions Weight Bearing Restrictions: Yes RLE Weight Bearing: Non weight bearing      Mobility Bed Mobility Overal bed mobility: Needs Assistance Bed Mobility: Supine to Sit     Supine to sit: Min assist;+2 for safety/equipment Sit to supine: Min guard   General bed mobility comments: heavy use of rails, increased time    Transfers Overall transfer level: Needs assistance Equipment used: None Transfers: Lateral/Scoot Transfers          Lateral/Scoot Transfers: Min assist;From elevated surface;+2 safety/equipment General transfer comment: increased time and effort with cues and supervision for safety. Requires encouragement, relaxation technique cues.    Balance Overall balance assessment: Needs assistance Sitting-balance support: Feet supported Sitting balance-Leahy Scale: Fair Sitting balance - Comments: able to sit with supervision Postural  control: Left lateral lean Standing balance support: Bilateral upper extremity supported;During functional activity Standing balance-Leahy Scale: Poor                             ADL either performed or assessed with clinical judgement   ADL Overall ADL's : Needs assistance/impaired Eating/Feeding: Independent;Sitting   Grooming: Wash/dry hands;Wash/dry face;Min guard;Sitting   Upper Body Bathing: Min guard;Sitting   Lower Body Bathing: Maximal assistance;Sitting/lateral leans   Upper Body Dressing : Min guard;Sitting   Lower Body Dressing: Maximal assistance;Sitting/lateral leans   Toilet Transfer: Minimal assistance;+2 for safety/equipment Toilet Transfer Details (indicate cue type and reason): simulated to drop arm recliner, lateral scoot Toileting- Clothing Manipulation and Hygiene: Total assistance       Functional mobility during ADLs: Minimal assistance;+2 for safety/equipment General ADL Comments: pt and wife educated on complensatory LB ADL technqiues     Vision Baseline Vision/History: Wears glasses Patient Visual Report: No change from baseline       Perception     Praxis      Pertinent Vitals/Pain Pain Assessment: Faces Faces Pain Scale: Hurts even more Pain Location: R LE with movement Pain Descriptors / Indicators: Operative site guarding;Discomfort;Grimacing;Guarding;Moaning Pain Intervention(s): Limited activity within patient's tolerance;Monitored during session;Repositioned;Premedicated before session     Hand Dominance     Extremity/Trunk Assessment             Communication     Cognition Arousal/Alertness: Awake/alert Behavior During Therapy: WFL for tasks assessed/performed Overall Cognitive Status: Within Functional Limits for tasks assessed  General Comments: Patient with improved pain tolerance this session. Continues to require increased time for mobility, but much improved  from yesterday   General Comments       Exercises Amputee Exercises Quad Sets: AROM;Right;5 reps Hip ABduction/ADduction: AROM;Right;5 reps Straight Leg Raises: AROM;5 reps;Right   Shoulder Instructions      Home Living                                          Prior Functioning/Environment                   OT Problem List: Decreased strength;Impaired balance (sitting and/or standing);Pain;Decreased activity tolerance;Decreased knowledge of use of DME or AE      OT Treatment/Interventions: Self-care/ADL training;Patient/family education;Therapeutic activities;DME and/or AE instruction    OT Goals(Current goals can be found in the care plan section) Acute Rehab OT Goals Patient Stated Goal: return to independence with mobilitly OT Goal Formulation: With patient/family Time For Goal Achievement: 11/01/20 Potential to Achieve Goals: Good ADL Goals Pt Will Perform Grooming: with supervision;with set-up;sitting Pt Will Perform Upper Body Bathing: with supervision;with set-up;sitting Pt Will Perform Lower Body Bathing: with mod assist;sitting/lateral leans Pt Will Perform Upper Body Dressing: with supervision;with set-up;sitting Pt Will Transfer to Toilet: with min assist;with min guard assist;stand pivot transfer;with transfer board (lateral scoot) Pt Will Perform Toileting - Clothing Manipulation and hygiene: with mod assist;sitting/lateral leans  OT Frequency: Min 2X/week   Barriers to D/C:            Co-evaluation              AM-PAC OT "6 Clicks" Daily Activity     Outcome Measure Help from another person eating meals?: None Help from another person taking care of personal grooming?: A Little Help from another person toileting, which includes using toliet, bedpan, or urinal?: A Lot Help from another person bathing (including washing, rinsing, drying)?: A Lot Help from another person to put on and taking off regular upper body clothing?: A  Little Help from another person to put on and taking off regular lower body clothing?: A Lot 6 Click Score: 16   End of Session Nurse Communication: Mobility status  Activity Tolerance: Patient limited by pain Patient left:    OT Visit Diagnosis: Other abnormalities of gait and mobility (R26.89);Muscle weakness (generalized) (M62.81);Pain Pain - Right/Left: Right Pain - part of body: Leg                Time: 9937-1696 OT Time Calculation (min): 37 min Charges:  OT General Charges $OT Visit: 1 Visit OT Evaluation $OT Eval Moderate Complexity: 1 Mod    Britt Bottom 10/18/2020, 4:03 PM

## 2020-10-18 NOTE — Plan of Care (Signed)
  Problem: Education: Goal: Knowledge of General Education information will improve Description: Including pain rating scale, medication(s)/side effects and non-pharmacologic comfort measures Outcome: Progressing   Problem: Health Behavior/Discharge Planning: Goal: Ability to manage health-related needs will improve Outcome: Progressing   Problem: Activity: Goal: Risk for activity intolerance will decrease Outcome: Progressing   Problem: Nutrition: Goal: Adequate nutrition will be maintained Outcome: Progressing   Problem: Coping: Goal: Level of anxiety will decrease Outcome: Progressing   Problem: Elimination: Goal: Will not experience complications related to urinary retention Outcome: Progressing   Problem: Pain Managment: Goal: General experience of comfort will improve Outcome: Progressing   Problem: Safety: Goal: Ability to remain free from injury will improve Outcome: Progressing

## 2020-10-18 NOTE — Progress Notes (Incomplete)
Inpatient Rehab Admissions Coordinator:

## 2020-10-18 NOTE — TOC Initial Note (Signed)
Transition of Care [facility]) - Initial/Assessment Note    Patient Details  Name: Andres [name] MRN: [id number] Date of Birth: [birth year]  Transition [facility]) CM/SW Contact:    Trula Ore, [place] Phone Number: 10/18/2020, 3:09 PM  Clinical Narrative:                   CSW received consult for possible SNF placement at time of discharge. CSW spoke with patient and patients spouse at bedside regarding PT recommendation of SNF placement at time of discharge. . Patient and patients spouse expressed understanding of PT recommendation and is agreeable to SNF placement at time of discharge. Patients spouse gave CSW permission to fax out initial referral near [facility] and [place] area.CSW discussed insurance authorization process and Medicare SNF ratings list. Patient has not received the COVID vaccines. No further questions reported at this time. CSW to continue to follow and assist with discharge planning needs.  Expected Discharge Plan: Skilled Nursing Facility Barriers to Discharge: Continued Medical Work up   Patient Goals and CMS Choice Patient states their goals for this hospitalization and ongoing recovery are:: to go to SNF CMS Medicare.gov Compare Post Acute Care list provided to:: Patient Represenative (must comment) (patients spouse [name]) Choice offered to / list presented to : Patient,Spouse (spouse [place])  Expected Discharge Plan and Services Expected Discharge Plan: [place]       Living arrangements for the past 2 months: Single Family Home                                      Prior Living Arrangements/Services Living arrangements for the past 2 months: Single Family Home Lives with:: Self,Spouse Patient language and need for interpreter reviewed:: Yes Do you feel safe going back to the place where you live?: No   SNF  Need for Family Participation in Patient Care: Yes (Comment) Care giver support system in place?: Yes  (comment)   Criminal Activity/Legal Involvement Pertinent to Current Situation/Hospitalization: No - Comment as needed  Activities of Daily Living Home Assistive Devices/Equipment: Cane (specify quad or straight),Dentures (specify type),Eyeglasses,Hearing aid ADL Screening (condition at time of admission) Patient's cognitive ability adequate to safely complete daily activities?: Yes Is the patient deaf or have difficulty hearing?: Yes Does the patient have difficulty seeing, even when wearing glasses/contacts?: No Does the patient have difficulty concentrating, remembering, or making decisions?: No Patient able to express need for assistance with ADLs?: Yes Does the patient have difficulty dressing or bathing?: No Independently performs ADLs?: Yes (appropriate for developmental age) Does the patient have difficulty walking or climbing stairs?: No Weakness of Legs: None Weakness of Arms/Hands: None  Permission Sought/Granted Permission sought to share information with : Case Manager,Family Chief Financial Officer Permission granted to share information with : Yes, Verbal Permission Granted  Share Information with NAME: Marliss Coots  Permission granted to share info w AGENCY: SNF  Permission granted to share info w Relationship: spouse  Permission granted to share info w Contact Information: Marliss Coots [phone]  Emotional Assessment Appearance:: Appears stated age Attitude/Demeanor/Rapport: Gracious Affect (typically observed): Calm Orientation: : Oriented to Self,Oriented to Place,Oriented to  Time,Oriented to Situation Alcohol / Substance Use: Not Applicable Psych Involvement: No (comment)  Admission diagnosis:  Cellulitis [L03.90] Cellulitis of right lower extremity [L03.115] COVID [U07.1] Patient Active Problem List   Diagnosis Date Noted  . Necrotizing fasciitis of lower leg ([place])   .  Abscess of right lower leg   . Cellulitis 10/05/2020  . Cellulitis of right  lower extremity 10/05/2020  . COVID-19 virus infection 10/05/2020  . COVID   . Hyponatremia   . Chest pain 11/08/2018  . Accelerated hypertension 11/08/2018  . Encounter to establish care 07/09/2017  . Essential hypertension 08/16/2015  . Sensorineural hearing loss (SNHL), bilateral 08/16/2015  . Impaired fasting glucose 08/23/2014  . Obesity (BMI 30-39.9) 08/23/2014  . History of nonmelanoma skin cancer 07/15/2012  . Allergic rhinitis 10/24/2011  . Basal cell carcinoma of back 07/10/2011   PCP:  Mackie Pai, PA-C Pharmacy:   [id number] [place], [place] [address] Phone: [phone] Fax: [phone]     Social Determinants of Health (SDOH) Interventions    Readmission Risk Interventions No flowsheet data found.

## 2020-10-19 DIAGNOSIS — L03115 Cellulitis of right lower limb: Secondary | ICD-10-CM | POA: Diagnosis not present

## 2020-10-19 LAB — CREATININE, SERUM
Creatinine, Ser: 0.82 mg/dL (ref 0.61–1.24)
GFR, Estimated: >60 mL/min (ref >60)

## 2020-10-19 MED ORDER — POLYETHYLENE GLYCOL 3350 17 G PO PACK: 17.0000 g | PACK | Freq: Every day | ORAL | 0 refills | Status: DC | PRN

## 2020-10-19 MED ORDER — GABAPENTIN 100 MG PO CAPS
200.0000 mg | ORAL_CAPSULE | Freq: Three times a day (TID) | ORAL | Status: DC
  Administered 2020-10-19 – 2020-10-20 (×4): 200 mg via ORAL
  Filled 2020-10-19 (×4): qty 2

## 2020-10-19 MED ORDER — DOCUSATE SODIUM 100 MG PO CAPS: 100.0000 mg | ORAL_CAPSULE | Freq: Two times a day (BID) | ORAL | 0 refills | Status: DC

## 2020-10-19 MED ORDER — GABAPENTIN 100 MG PO CAPS: 200.0000 mg | ORAL_CAPSULE | Freq: Three times a day (TID) | ORAL | Status: DC

## 2020-10-19 MED ORDER — OXYCODONE HCL 10 MG PO TABS: 10.0000 mg | ORAL_TABLET | ORAL | 0 refills | Status: DC | PRN

## 2020-10-19 MED ORDER — METHOCARBAMOL 500 MG PO TABS: 500.0000 mg | ORAL_TABLET | Freq: Four times a day (QID) | ORAL | Status: DC | PRN

## 2020-10-19 MED ORDER — OXYCODONE HCL 5 MG PO TABS
10.0000 mg | ORAL_TABLET | ORAL | Status: DC | PRN
  Administered 2020-10-19 – 2020-10-20 (×5): 10 mg via ORAL
  Filled 2020-10-19 (×5): qty 2

## 2020-10-19 NOTE — Plan of Care (Signed)
  Problem: Education: Goal: Knowledge of General Education information will improve Description: Including pain rating scale, medication(s)/side effects and non-pharmacologic comfort measures Outcome: Progressing   Problem: Health Behavior/Discharge Planning: Goal: Ability to manage health-related needs will improve Outcome: Progressing   Problem: Clinical Measurements: Goal: Ability to maintain clinical measurements within normal limits will improve Outcome: Progressing   Problem: Activity: Goal: Risk for activity intolerance will decrease Outcome: Progressing   Problem: Nutrition: Goal: Adequate nutrition will be maintained Outcome: Progressing   Problem: Coping: Goal: Level of anxiety will decrease Outcome: Progressing   Problem: Elimination: Goal: Will not experience complications related to urinary retention Outcome: Progressing   Problem: Pain Managment: Goal: General experience of comfort will improve Outcome: Progressing

## 2020-10-19 NOTE — Progress Notes (Signed)
Inpatient Rehab Admissions:  Inpatient Rehab Consult received.  I met with patient and his spouse Marliss Coots) at the bedside for rehabilitation assessment and to discuss goals and expectations of an inpatient rehab admission.  Case Manager from [facility]) also present through part of session.  We discussed ELOS of about 2 weeks, and goals of supervision to mod I, and that it was likely he would primarily use a w/c with possibility of some short distance ambulation.  Melissa is going to check on coverage for CIR.  I did let them know that we would be unlikely to have a bed for him tomorrow, and no planned discharges from CIR over the weekend.  I will put him on our list for possible admission as soon as a bed becomes available if he does not discharge to another venue (SNF versus home) before then.   Signed: Shann Medal, PT, DPT Admissions Coordinator [phone] 10/19/20  1:33 PM

## 2020-10-19 NOTE — Progress Notes (Signed)
Patient is postop day 4 status post right above-knee amputation. He is lying in bed having some mild stomach pain.   Wound VAC has to x but is functioning. Not alarming. No drainage in the canister.  Per patient he is going to be speaking with social worker today regarding discharging to a facility temporarily. Would need 1 week follow-up with [place].

## 2020-10-19 NOTE — Plan of Care (Signed)

## 2020-10-19 NOTE — Progress Notes (Signed)
PROGRESS NOTE    Andres [name]  LHT:[id number] DOB: 1955/11/02 DOA: 10/05/2020 PCP: Mackie Pai, PA-C    Brief Narrative:  65 year old gentleman with history of hypertension presented to the hospital with right lower extremity cellulitis, failed outpatient therapy with antibiotics on 10/05/20.  MRI showed diffuse subcutaneous soft tissue swelling and edema with no evidence of abscess.  Treated with Rocephin and daptomycin.  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   Assessment & Plan:   Principal Problem:   Cellulitis of right lower extremity Active Problems:   Essential hypertension   Cellulitis   Hyponatremia   COVID-19 virus infection   Abscess of right lower leg   Necrotizing fasciitis of lower leg ([facility])  Necrotizing fasciitis of the right lower extremity with myositis secondary to MRSA infection: Antibiotics 2/3-2/9 2/9 right leg fasciotomy for compartment syndrome and excision of soft tissue muscle and fascia. 2/11 repeat debridement 2/13 right above-knee amputation due to significant infection present. Blood cultures 1/2 with Corynebacterium likely contaminant Wound culture with rare MRSA. Rocephin and daptomycin until 2/16 to complete 2 weeks of therapy. Remains in significant pain needing IV opiates and oral opiates for pain control. Wound VAC management as per surgery. Gabapentin is helping, will increase the dose today. Work with PT OT.  Will benefit with acute aggressive rehab.  Will refer to CIR.  Also referred to SNF as backup. Increased dose of oxycodone to 10 mg every 4 hours in anticipation of using less IV opiates.  Incidental COVID-19 infection: Completed IV remdesivir.  Currently off isolation.  Hypovolemic hyponatremia: Improved.  Acute renal failure: Improved.  Essential hypertension: Blood pressure stable.  Acute blood loss anemia: Hemoglobin was less than 7.   Received 2 units PRBC on 2/13.  Hemoglobin remains stable since then.  DVT prophylaxis: SCDs Start: 10/15/20 1048 SCDs Start: 10/13/20 1550 SCDs Start: 10/11/20 1705   Code Status: Full code Family Communication: None at the bedside Disposition Plan: Status is: Inpatient  Remains inpatient appropriate because:Ongoing active pain requiring inpatient pain management   Dispo: The patient is from: Home              Anticipated d/c is to: CIR vs SNF               Anticipated d/c date is: When bed available.              Patient currently is medically stable.   Difficult to place patient No         Consultants:   Orthopedics  Procedures:   Multiple procedures as above  Antimicrobials:   Rocephin and daptomycin 2/3-2/16   Subjective: Patient seen and examined.  His pain is slightly controlled better than yesterday.  He used injectable Dilaudid 2 times since yesterday.  Denies nausea vomiting.  Denies any chest pain or shortness of breath.  Objective: Vitals:   10/18/20 0855 10/18/20 1457 10/18/20 2034 10/19/20 0330  BP: 140/70 103/61 125/64 127/64  Pulse: 77 83 80 77  Resp: 16 17 19 16   Temp: 99 F (37.2 C) 97.8 F (36.6 C) 98 F (36.7 C) 98.5 F (36.9 C)  TempSrc: Oral Oral Oral Oral  SpO2: 99% 99% 97% 96%  Weight:      Height:        Intake/Output Summary (Last 24 hours) at 10/19/2020 1137 Last data filed at 10/19/2020 0856 Gross per 24 hour  Intake 240 ml  Output 1900 ml  Net -1660 ml   Filed Weights   10/05/20 1203 10/05/20 1430 10/05/20 1930  Weight: 107 kg 107 kg 116.9 kg    Examination:  General exam: Appears comfortable. Respiratory system: Clear to auscultation. Respiratory effort normal.  No added sounds. Cardiovascular system: S1 & S2 heard, RRR. No JVD, murmurs, rubs, gallops or clicks.  Gastrointestinal system: Abdomen is nondistended, soft and nontender. No organomegaly or masses felt. Normal bowel sounds heard. Central nervous system:  Alert and oriented. No focal neurological deficits. Extremities: Symmetric 5 x 5 power. Right above-knee amputation the stump with wound VAC placed, no drainage on wound VAC.   Data Reviewed: I have personally reviewed following labs and imaging studies  CBC: Recent Labs  Lab 10/13/20 0455 10/14/20 0226 10/15/20 0245 10/15/20 1146 10/16/20 0343  WBC 13.8* 18.4* 10.6* 14.1* 11.9*  NEUTROABS  --  15.7* 7.2 12.0* 9.0*  HGB 10.4* 8.5* 6.4* 8.6* 7.7*  HCT 32.9* 26.2* 19.0* 25.5* 22.3*  MCV 90.4 87.3 86.0 82.3 83.5  PLT 387 428* 375 422* 716   Basic Metabolic Panel: Recent Labs  Lab 10/13/20 0455 10/14/20 0226 10/15/20 0245 10/19/20 0454  NA 134* 131* 134*  --   K 4.4 5.1 4.1  --   CL 103 102 103  --   CO2 22 21* 23  --   GLUCOSE 91 183* 155*  --   BUN 14 15 15   --   CREATININE 0.91 0.86 0.88 0.82  CALCIUM 7.7* 7.8* 7.5*  --    GFR: Estimated Creatinine Clearance: 121.4 mL/min (by C-G formula based on SCr of 0.82 mg/dL). Liver Function Tests: No results for input(s): AST, ALT, ALKPHOS, BILITOT, PROT, ALBUMIN in the last 168 hours. No results for input(s): LIPASE, AMYLASE in the last 168 hours. No results for input(s): AMMONIA in the last 168 hours. Coagulation Profile: No results for input(s): INR, PROTIME in the last 168 hours. Cardiac Enzymes: Recent Labs  Lab 10/13/20 0455  CKTOTAL 67   BNP (last 3 results) No results for input(s): PROBNP in the last 8760 hours. HbA1C: Recent Labs    10/18/20 1528  HGBA1C 5.9*   CBG: No results for input(s): GLUCAP in the last 168 hours. Lipid Profile: No results for input(s): CHOL, HDL, LDLCALC, TRIG, CHOLHDL, LDLDIRECT in the last 72 hours. Thyroid Function Tests: No results for input(s): TSH, T4TOTAL, FREET4, T3FREE, THYROIDAB in the last 72 hours. Anemia Panel: No results for input(s): VITAMINB12, FOLATE, FERRITIN, TIBC, IRON, RETICCTPCT in the last 72 hours. Sepsis Labs: No results for input(s): PROCALCITON,  LATICACIDVEN in the last 168 hours.  Recent Results (from the past 240 hour(s))  MRSA PCR Screening     Status: None   Collection Time: 10/10/20  8:29 PM   Specimen: Nasal Mucosa; Nasopharyngeal  Result Value Ref Range Status   MRSA by PCR NEGATIVE NEGATIVE Final    Comment:        The GeneXpert MRSA Assay (FDA approved for NASAL specimens only), is one component of a comprehensive MRSA colonization surveillance program. It is not intended to diagnose MRSA infection nor to guide or monitor treatment for MRSA infections. Performed at [facility], [place] [address] [place], [place] [address]   Aerobic/Anaerobic Culture (surgical/deep wound)     Status: None   Collection Time: 10/11/20  3:41 PM   Specimen: Soft Tissue, Other  Result Value Ref Range Status   Specimen Description TISSUE RIGHT LEG  Final   Special Requests NONE  Final  Gram Stain   Final    RARE WBC PRESENT, PREDOMINANTLY MONONUCLEAR NO ORGANISMS SEEN    Culture   Final    RARE METHICILLIN RESISTANT STAPHYLOCOCCUS AUREUS CRITICAL RESULT CALLED TO, READ BACK BY AND VERIFIED WITH: RN V.MYAGAI AT 0981 ON 10/13/2020 BY T.SAAD NO ANAEROBES ISOLATED Performed at [facility], [place] [address] [place], [place] [address]    Report Status 10/16/2020 FINAL  Final   Organism ID, Bacteria METHICILLIN RESISTANT STAPHYLOCOCCUS AUREUS  Final      Susceptibility   Methicillin resistant staphylococcus aureus - MIC*    CIPROFLOXACIN >=8 RESISTANT Resistant     ERYTHROMYCIN >=8 RESISTANT Resistant     GENTAMICIN <=0.5 SENSITIVE Sensitive     OXACILLIN >=4 RESISTANT Resistant     TETRACYCLINE <=1 SENSITIVE Sensitive     VANCOMYCIN <=0.5 SENSITIVE Sensitive     TRIMETH/SULFA 160 RESISTANT Resistant     CLINDAMYCIN <=0.25 SENSITIVE Sensitive     RIFAMPIN <=0.5 SENSITIVE Sensitive     Inducible Clindamycin NEGATIVE Sensitive     * RARE METHICILLIN RESISTANT STAPHYLOCOCCUS AUREUS  Aerobic/Anaerobic Culture  (surgical/deep wound)     Status: None   Collection Time: 10/13/20  2:14 PM   Specimen: Leg, Right; Tissue  Result Value Ref Range Status   Specimen Description TISSUE RIGHT LOWER LEG  Final   Special Requests RT LOWER LEG MUSCLE  Final   Gram Stain   Final    FEW WBC PRESENT, PREDOMINANTLY PMN NO ORGANISMS SEEN    Culture   Final    No growth aerobically or anaerobically. Performed at [facility], [place] [address] [place], [place] [address]    Report Status 10/18/2020 FINAL  Final  Surgical pcr screen     Status: None   Collection Time: 10/15/20  1:24 AM   Specimen: Nasal Mucosa; Nasal Swab  Result Value Ref Range Status   MRSA, PCR NEGATIVE NEGATIVE Final   Staphylococcus aureus NEGATIVE NEGATIVE Final    Comment: (NOTE) The Xpert SA Assay (FDA approved for NASAL specimens in patients 41 years of age and older), is one component of a comprehensive surveillance program. It is not intended to diagnose infection nor to guide or monitor treatment. Performed at [facility], [place] [address] [place], [place] [address]          Radiology Studies: No results found.      Scheduled Meds: . vitamin C  500 mg Oral Daily  . aspirin  81 mg Oral Daily  . benzonatate  200 mg Oral TID  . docusate sodium  100 mg Oral BID  . enoxaparin (LOVENOX) injection  60 mg Subcutaneous Q24H  . gabapentin  200 mg Oral TID  . mouth rinse  15 mL Mouth Rinse BID  . sodium chloride  1 g Oral BID WC  . zinc sulfate  220 mg Oral Daily   Continuous Infusions: . sodium chloride 10 mL/hr at 10/16/20 2105  . methocarbamol (ROBAXIN) IV       LOS: 14 days    Time spent: 32 minutes    Barb Merino, MD [facility] Pager [phone]

## 2020-10-19 NOTE — TOC Progression Note (Incomplete)
Transition of Care [facility]) - Progression Note    Patient Details  Name: Andres [name] MRN: [id number] Date of Birth: 25-Nov-1955  [facility]) CM/SW Contact  Sharin Mons, RN Phone Number: 10/19/2020, 12:26 PM  Clinical Narrative:    Pt with only one bed offer noted, [facility]. [facility] rescinded bed acceptance 2/2 workman's compensation case.    Expected Discharge Plan: Skilled Nursing Facility Barriers to Discharge: Insurance Authorization,No SNF bed  Expected Discharge Plan and Services Expected Discharge Plan: [place] arrangements for the past 2 months: Single Family Home                                       Social Determinants of Health (SDOH) Interventions    Readmission Risk Interventions No flowsheet data found.

## 2020-10-20 ENCOUNTER — Inpatient Hospital Stay (HOSPITAL_COMMUNITY)
Admission: RE | Admit: 2020-10-20 | Discharge: 2020-11-03 | DRG: 560 | Disposition: A | Discharge status: Home Health | Payer: Worker's Compensation | Source: Intra-hospital | Attending: Physical Medicine & Rehabilitation | Admitting: Physical Medicine & Rehabilitation

## 2020-10-20 ENCOUNTER — Other Ambulatory Visit: Payer: Self-pay

## 2020-10-20 ENCOUNTER — Encounter (HOSPITAL_COMMUNITY): Payer: Self-pay | Admitting: Physical Medicine & Rehabilitation

## 2020-10-20 DIAGNOSIS — G8918 Other acute postprocedural pain: Secondary | ICD-10-CM

## 2020-10-20 DIAGNOSIS — Z87891 Personal history of nicotine dependence: Secondary | ICD-10-CM

## 2020-10-20 DIAGNOSIS — K5903 Drug induced constipation: Secondary | ICD-10-CM | POA: Diagnosis not present

## 2020-10-20 DIAGNOSIS — Z8616 Personal history of COVID-19: Secondary | ICD-10-CM | POA: Diagnosis not present

## 2020-10-20 DIAGNOSIS — Z888 Allergy status to other drugs, medicaments and biological substances status: Secondary | ICD-10-CM

## 2020-10-20 DIAGNOSIS — R7301 Impaired fasting glucose: Secondary | ICD-10-CM | POA: Diagnosis present

## 2020-10-20 DIAGNOSIS — Z6835 Body mass index (BMI) 35.0-35.9, adult: Secondary | ICD-10-CM

## 2020-10-20 DIAGNOSIS — I10 Essential (primary) hypertension: Secondary | ICD-10-CM | POA: Diagnosis not present

## 2020-10-20 DIAGNOSIS — S78111A Complete traumatic amputation at level between right hip and knee, initial encounter: Secondary | ICD-10-CM

## 2020-10-20 DIAGNOSIS — Z8679 Personal history of other diseases of the circulatory system: Secondary | ICD-10-CM

## 2020-10-20 DIAGNOSIS — F329 Major depressive disorder, single episode, unspecified: Secondary | ICD-10-CM | POA: Diagnosis present

## 2020-10-20 DIAGNOSIS — Z89611 Acquired absence of right leg above knee: Secondary | ICD-10-CM | POA: Diagnosis not present

## 2020-10-20 DIAGNOSIS — E871 Hypo-osmolality and hyponatremia: Secondary | ICD-10-CM

## 2020-10-20 DIAGNOSIS — G547 Phantom limb syndrome without pain: Secondary | ICD-10-CM | POA: Diagnosis present

## 2020-10-20 DIAGNOSIS — E669 Obesity, unspecified: Secondary | ICD-10-CM | POA: Diagnosis present

## 2020-10-20 DIAGNOSIS — Z85828 Personal history of other malignant neoplasm of skin: Secondary | ICD-10-CM

## 2020-10-20 DIAGNOSIS — Z7982 Long term (current) use of aspirin: Secondary | ICD-10-CM

## 2020-10-20 DIAGNOSIS — Z4781 Encounter for orthopedic aftercare following surgical amputation: Principal | ICD-10-CM

## 2020-10-20 DIAGNOSIS — G546 Phantom limb syndrome with pain: Secondary | ICD-10-CM | POA: Diagnosis present

## 2020-10-20 DIAGNOSIS — T50905A Adverse effect of unspecified drugs, medicaments and biological substances, initial encounter: Secondary | ICD-10-CM | POA: Diagnosis present

## 2020-10-20 DIAGNOSIS — Z79899 Other long term (current) drug therapy: Secondary | ICD-10-CM

## 2020-10-20 DIAGNOSIS — R7303 Prediabetes: Secondary | ICD-10-CM | POA: Diagnosis present

## 2020-10-20 DIAGNOSIS — L03115 Cellulitis of right lower limb: Secondary | ICD-10-CM | POA: Diagnosis not present

## 2020-10-20 DIAGNOSIS — D62 Acute posthemorrhagic anemia: Secondary | ICD-10-CM | POA: Diagnosis not present

## 2020-10-20 HISTORY — DX: Complete traumatic amputation at level between right hip and knee, initial encounter: S78.111A

## 2020-10-20 HISTORY — DX: Major depressive disorder, single episode, unspecified: F32.9

## 2020-10-20 LAB — CBC WITH DIFFERENTIAL/PLATELET
Abs Immature Granulocytes: 0.04 10*3/uL (ref 0.00–0.07)
Basophils Absolute: 0.1 10*3/uL (ref 0.0–0.1)
Basophils Relative: 1 %
Eosinophils Absolute: 0.2 10*3/uL (ref 0.0–0.5)
Eosinophils Relative: 3 %
HCT: 21.6 % — ABNORMAL LOW (ref 39.0–52.0)
Hemoglobin: 7 g/dL — ABNORMAL LOW (ref 13.0–17.0)
Immature Granulocytes: 1 %
Lymphocytes Relative: 31 %
Lymphs Abs: 2.4 10*3/uL (ref 0.7–4.0)
MCH: 28.2 pg (ref 26.0–34.0)
MCHC: 32.4 g/dL (ref 30.0–36.0)
MCV: 87.1 fL (ref 80.0–100.0)
Monocytes Absolute: 0.5 10*3/uL (ref 0.1–1.0)
Monocytes Relative: 6 %
Neutro Abs: 4.4 10*3/uL (ref 1.7–7.7)
Neutrophils Relative %: 58 %
Platelets: 505 10*3/uL — ABNORMAL HIGH (ref 150–400)
RBC: 2.48 MIL/uL — ABNORMAL LOW (ref 4.22–5.81)
RDW: 19.1 % — ABNORMAL HIGH (ref 11.5–15.5)
WBC: 7.6 10*3/uL (ref 4.0–10.5)
nRBC: 0 % (ref 0.0–0.2)

## 2020-10-20 MED ORDER — POLYETHYLENE GLYCOL 3350 17 G PO PACK: 17.0000 g | PACK | Freq: Every day | ORAL | Status: DC | PRN

## 2020-10-20 MED ORDER — TRAMADOL HCL 50 MG PO TABS
50.0000 mg | ORAL_TABLET | Freq: Four times a day (QID) | ORAL | Status: DC | PRN
  Administered 2020-10-21 – 2020-10-27 (×11): 50 mg via ORAL
  Filled 2020-10-20 (×11): qty 1

## 2020-10-20 MED ORDER — SODIUM CHLORIDE 0.9 % IV SOLN
510.0000 mg | Freq: Once | INTRAVENOUS | Status: AC
  Administered 2020-10-20: 510 mg via INTRAVENOUS
  Filled 2020-10-20: qty 17

## 2020-10-20 MED ORDER — GUAIFENESIN-DM 100-10 MG/5ML PO SYRP: 5.0000 mL | ORAL_SOLUTION | Freq: Four times a day (QID) | ORAL | Status: DC | PRN

## 2020-10-20 MED ORDER — GABAPENTIN 100 MG PO CAPS
200.0000 mg | ORAL_CAPSULE | Freq: Three times a day (TID) | ORAL | Status: DC
  Administered 2020-10-20 – 2020-11-01 (×36): 200 mg via ORAL
  Filled 2020-10-20 (×38): qty 2

## 2020-10-20 MED ORDER — SODIUM CHLORIDE 0.9 % IV SOLN
510.0000 mg | Freq: Once | INTRAVENOUS | Status: AC
  Administered 2020-10-27: 510 mg via INTRAVENOUS
  Filled 2020-10-20: qty 17

## 2020-10-20 MED ORDER — ZINC SULFATE 220 (50 ZN) MG PO CAPS
220.0000 mg | ORAL_CAPSULE | Freq: Every day | ORAL | Status: DC
  Administered 2020-10-21 – 2020-10-26 (×6): 220 mg via ORAL
  Filled 2020-10-20 (×6): qty 1

## 2020-10-20 MED ORDER — ACETAMINOPHEN 325 MG PO TABS
325.0000 mg | ORAL_TABLET | ORAL | Status: DC | PRN
  Administered 2020-10-21: 325 mg via ORAL
  Filled 2020-10-20 (×2): qty 2

## 2020-10-20 MED ORDER — SODIUM CHLORIDE 1 G PO TABS
1.0000 g | ORAL_TABLET | Freq: Two times a day (BID) | ORAL | Status: DC
  Administered 2020-10-20 – 2020-10-21 (×3): 1 g via ORAL
  Filled 2020-10-20 (×4): qty 1

## 2020-10-20 MED ORDER — PROCHLORPERAZINE MALEATE 5 MG PO TABS: 5.0000 mg | ORAL_TABLET | Freq: Four times a day (QID) | ORAL | Status: DC | PRN

## 2020-10-20 MED ORDER — BISACODYL 10 MG RE SUPP: 10.0000 mg | Freq: Every day | RECTAL | Status: DC | PRN

## 2020-10-20 MED ORDER — FERROUS SULFATE 325 (65 FE) MG PO TBEC: 325.0000 mg | DELAYED_RELEASE_TABLET | Freq: Two times a day (BID) | ORAL | 11 refills | Status: DC

## 2020-10-20 MED ORDER — ASPIRIN 81 MG PO CHEW
81.0000 mg | CHEWABLE_TABLET | Freq: Every day | ORAL | Status: DC
  Administered 2020-10-21 – 2020-11-03 (×14): 81 mg via ORAL
  Filled 2020-10-20 (×14): qty 1

## 2020-10-20 MED ORDER — ENOXAPARIN SODIUM 40 MG/0.4ML ~~LOC~~ SOLN
40.0000 mg | SUBCUTANEOUS | Status: DC
  Administered 2020-10-20 – 2020-11-02 (×14): 40 mg via SUBCUTANEOUS
  Filled 2020-10-20 (×14): qty 0.4

## 2020-10-20 MED ORDER — POLYETHYLENE GLYCOL 3350 17 G PO PACK
17.0000 g | PACK | Freq: Every day | ORAL | Status: DC | PRN
  Administered 2020-10-22 – 2020-10-25 (×2): 17 g via ORAL
  Filled 2020-10-20 (×2): qty 1

## 2020-10-20 MED ORDER — OXYCODONE HCL 5 MG PO TABS
10.0000 mg | ORAL_TABLET | ORAL | Status: DC | PRN
Start: 2020-10-20 — End: 2020-11-03
  Administered 2020-10-20 – 2020-11-03 (×35): 10 mg via ORAL
  Filled 2020-10-20 (×37): qty 2

## 2020-10-20 MED ORDER — DIPHENHYDRAMINE HCL 12.5 MG/5ML PO ELIX: 12.5000 mg | ORAL_SOLUTION | Freq: Four times a day (QID) | ORAL | Status: DC | PRN

## 2020-10-20 MED ORDER — DOCUSATE SODIUM 100 MG PO CAPS
100.0000 mg | ORAL_CAPSULE | Freq: Two times a day (BID) | ORAL | Status: DC
  Administered 2020-10-20 – 2020-10-21 (×2): 100 mg via ORAL
  Filled 2020-10-20 (×2): qty 1

## 2020-10-20 MED ORDER — CYCLOBENZAPRINE HCL 5 MG PO TABS
5.0000 mg | ORAL_TABLET | Freq: Three times a day (TID) | ORAL | Status: DC | PRN
  Administered 2020-10-23 – 2020-10-29 (×10): 5 mg via ORAL
  Filled 2020-10-20 (×10): qty 1

## 2020-10-20 MED ORDER — ALUM & MAG HYDROXIDE-SIMETH 200-200-20 MG/5ML PO SUSP: 30.0000 mL | ORAL | Status: DC | PRN

## 2020-10-20 MED ORDER — TRAZODONE HCL 50 MG PO TABS
25.0000 mg | ORAL_TABLET | Freq: Every evening | ORAL | Status: DC | PRN
  Administered 2020-10-28: 25 mg via ORAL
  Administered 2020-10-29: 50 mg via ORAL
  Filled 2020-10-20 (×2): qty 1

## 2020-10-20 MED ORDER — FLEET ENEMA 7-19 GM/118ML RE ENEM: 1.0000 | ENEMA | Freq: Once | RECTAL | Status: DC | PRN

## 2020-10-20 MED ORDER — PROCHLORPERAZINE EDISYLATE 10 MG/2ML IJ SOLN: 5.0000 mg | Freq: Four times a day (QID) | INTRAMUSCULAR | Status: DC | PRN

## 2020-10-20 MED ORDER — ASCORBIC ACID 500 MG PO TABS
500.0000 mg | ORAL_TABLET | Freq: Every day | ORAL | Status: DC
  Administered 2020-10-21 – 2020-11-03 (×14): 500 mg via ORAL
  Filled 2020-10-20 (×15): qty 1

## 2020-10-20 MED ORDER — PROCHLORPERAZINE 25 MG RE SUPP: 12.5000 mg | Freq: Four times a day (QID) | RECTAL | Status: DC | PRN

## 2020-10-20 MED ORDER — BENZONATATE 100 MG PO CAPS
200.0000 mg | ORAL_CAPSULE | Freq: Three times a day (TID) | ORAL | Status: DC
  Administered 2020-10-20 – 2020-10-26 (×16): 200 mg via ORAL
  Filled 2020-10-20 (×17): qty 2

## 2020-10-20 NOTE — Progress Notes (Signed)
Occupational Therapy Treatment Patient Details Name: Andres [name] MRN: [id number] DOB: 1956/05/20 Today's Date: 10/20/2020    History of present illness 65 y.o. 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  Was also noted Covid positive on admission.  PMHx:  basal cell CA, HTN,   OT comments  Pt making good progress with functional goals. Able to later scoot to drop arm recliner for simulated LB ADL tasks. Pt states that he feels better and that he is eager to d/c to CIR  Follow Up Recommendations  CIR    Equipment Recommendations  Other (comment) (TBD at CIR)    Recommendations for Other Services      Precautions / Restrictions Precautions Precautions: Fall Restrictions Weight Bearing Restrictions: Yes RLE Weight Bearing: Non weight bearing       Mobility Bed Mobility Overal bed mobility: Needs Assistance Bed Mobility: Supine to Sit       Sit to supine: Min guard   General bed mobility comments: increased time  Transfers Overall transfer level: Needs assistance Equipment used: None Transfers: Lateral/Scoot Transfers          Lateral/Scoot Transfers: Min guard General transfer comment: increased time and effort with cues and supervision for safety.    Balance Overall balance assessment: Needs assistance Sitting-balance support: Feet supported Sitting balance-Leahy Scale: Fair Sitting balance - Comments: able to sit with supervision                                   ADL either performed or assessed with clinical judgement   ADL Overall ADL's : Needs assistance/impaired     Grooming: Wash/dry hands;Wash/dry face;Sitting;Supervision/safety;Set up       Lower Body Bathing: Moderate assistance;Sitting/lateral leans Lower Body  Bathing Details (indicate cue type and reason): simulated seated in drop arm recliner Upper Body Dressing : Set up;Supervision/safety;Sitting     Lower Body Dressing Details (indicate cue type and reason): reviewed LB dressing techniques Toilet Transfer: Min guard Toilet Transfer Details (indicate cue type and reason): simulated to drop arm recliner, lateral scoot Toileting- Clothing Manipulation and Hygiene: Moderate assistance       Functional mobility during ADLs: Min guard General ADL Comments: pt and wife educated on complensatory LB ADL technqiues     Vision Baseline Vision/History: Wears glasses Patient Visual Report: No change from baseline     Perception     Praxis      Cognition Arousal/Alertness: Awake/alert Behavior During Therapy: WFL for tasks assessed/performed Overall Cognitive Status: Within Functional Limits for tasks assessed                                 General Comments: Patient with improved pain tolerance this session. Continues to require increased time for mobility, but much improved from last session        Exercises     Shoulder Instructions       General Comments      Pertinent Vitals/ Pain       Pain Assessment: Faces Faces Pain Scale: Hurts even more Pain Location: R LE with movement Pain Descriptors / Indicators: Operative site guarding;Grimacing;Guarding Pain Intervention(s): Monitored during session;Repositioned;Premedicated before session  Home Living                                          Prior Functioning/Environment              Frequency  Min 2X/week        Progress Toward Goals  OT Goals(current goals can now be found in the care plan section)  Progress towards OT goals: Progressing toward goals  Acute Rehab OT Goals Patient Stated Goal: return to independence with mobilitly  Plan Discharge plan remains appropriate    Co-evaluation                 AM-PAC OT "6  Clicks" Daily Activity     Outcome Measure   Help from another person eating meals?: None Help from another person taking care of personal grooming?: A Little Help from another person toileting, which includes using toliet, bedpan, or urinal?: A Lot Help from another person bathing (including washing, rinsing, drying)?: A Lot Help from another person to put on and taking off regular upper body clothing?: A Little Help from another person to put on and taking off regular lower body clothing?: A Lot 6 Click Score: 16    End of Session    OT Visit Diagnosis: Other abnormalities of gait and mobility (R26.89);Muscle weakness (generalized) (M62.81);Pain Pain - Right/Left: Right Pain - part of body: Leg   Activity Tolerance Patient tolerated treatment well   Patient Left in chair;with family/visitor present;Other (comment) (with PT)   Nurse Communication          Time: 3846-6599 OT Time Calculation (min): 16 min  Charges: OT General Charges $OT Visit: 1 Visit OT Treatments $Therapeutic Activity: 8-22 mins     Britt Bottom 10/20/2020, 2:20 PM

## 2020-10-20 NOTE — PMR Pre-admission (Signed)
PMR Admission Coordinator Pre-Admission Assessment  Patient: Andres [name] is an 65 y.o., male MRN: [id number] DOB: 26-Jun-1956 Height: 5\' 11"  (180.3 cm) Weight: 116.9 kg  Insurance Information HMO:     PPO:      PCP:      IPA:      80/20:      OTHER:  PRIMARY: Worker's Comp      Subscriber:  CM Name: Wallene Huh      Phone#: [phone]     Fax#: [phone] Pre-Cert#:       Employer:  Benefits:  Phone #:      Name:  Irene Shipper. Date:      Deduct:       Out of Pocket Max:       Life Max:  CIR:       SNF:  Outpatient:      Co-Pay:  Home Health:       Co-Pay:  DME:      Co-Pay:  Providers:  SECONDARY: BCBS      Policy#: [id number]     Phone#:   Financial Counselor:       Phone#:   The "Data Collection Information Summary" for patients in Inpatient Rehabilitation Facilities with attached "Privacy Act [place] Records" was provided and verbally reviewed with: N/A  Emergency Contact Information Contact Information    Name Relation Home Work Mobile   [place]   [phone]      Current Medical History  Patient Admitting Diagnosis: 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  Several days later presented to his doctors office and was prescribed antibiotics, which did not appear to be effective as several days after that he was unable to walk.  Was sent to the ED at [facility] and then transferred to [facility], where he was admitted.  MRI showed diffuse subcutaneous soft tissue swelling and edema with no evidence of abscess.  Pt was treated with rocephin and daptomycin without improvement.  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 Sharol Given.  Pt completed rocephin and daptomyocin on 2/16.  Wound vac per orthopedics. Pt was incidentally found to be covid positive, and has completed course of IV remdesivir and off isolation.  Hospital course pain management and ABLA.  Received 2 units PRBC on 2/13.  Therapy evaluations were completed and pt was recommended for CIR for comprehensive rehab.     [facility]'s [facility] record from [facility] has been reviewed by the rehabilitation admission coordinator and physician.  Past Medical History  Past Medical History:  Diagnosis Date  . Basal cell carcinoma   . Hypertension     Family History   Family history is unknown by patient.  Prior Rehab/Hospitalizations Has the patient had prior rehab or hospitalizations prior to admission? No  Has the patient had major surgery during 100 days prior to admission? Yes   Current Medications  Current Facility-Administered Medications:  .  0.9 %  sodium chloride infusion, , Intravenous, Continuous, Newt Minion, MD, Last Rate: 10 mL/hr at 10/16/20 2105, New Bag at  10/16/20 2105 .  acetaminophen (TYLENOL) tablet 325-650 mg, 325-650 mg, Oral, Q6H PRN, Newt Minion, MD, 650 mg at 10/17/20 1240 .  ascorbic acid (VITAMIN C) tablet 500 mg, 500 mg, Oral, Daily, Newt Minion, MD, 500 mg at 10/20/20 0919 .  aspirin chewable tablet 81 mg, 81 mg, Oral, Daily, Newt Minion, MD, 81 mg at 10/20/20 0919 .  benzonatate (TESSALON) capsule 200 mg, 200 mg, Oral, TID, Newt Minion, MD, 200 mg at 10/20/20 0919 .  bisacodyl (DULCOLAX) suppository 10 mg, 10 mg, Rectal, Daily PRN, Newt Minion, MD .  docusate sodium (COLACE) capsule 100 mg, 100 mg, Oral, BID, Newt Minion, MD, 100 mg at 10/20/20 0919 .  enoxaparin (LOVENOX) injection 60 mg, 60 mg, Subcutaneous, Q24H, Newt Minion, MD, 60 mg at 10/19/20 2032 .  ferumoxytol (FERAHEME) 510 mg in sodium chloride 0.9 % 100  mL IVPB, 510 mg, Intravenous, Once, Barb Merino, MD, Last Rate: 468 mL/hr at 10/20/20 0923, 510 mg at 10/20/20 0923 .  gabapentin (NEURONTIN) capsule 200 mg, 200 mg, Oral, TID, Barb Merino, MD, 200 mg at 10/20/20 0919 .  guaiFENesin-dextromethorphan (ROBITUSSIN DM) 100-10 MG/5ML syrup 5 mL, 5 mL, Oral, Q6H PRN, Newt Minion, MD .  hydrALAZINE (APRESOLINE) injection 10 mg, 10 mg, Intravenous, Q4H PRN, Newt Minion, MD .  HYDROmorphone (DILAUDID) injection 1-2 mg, 1-2 mg, Intravenous, Q3H PRN, Charlynne Cousins, MD, 2 mg at 10/18/20 2017 .  magnesium citrate solution 1 Bottle, 1 Bottle, Oral, Once PRN, Newt Minion, MD .  MEDLINE mouth rinse, 15 mL, Mouth Rinse, BID, Newt Minion, MD, 15 mL at 10/20/20 0919 .  methocarbamol (ROBAXIN) tablet 500 mg, 500 mg, Oral, Q6H PRN, 500 mg at 10/18/20 2211 **OR** methocarbamol (ROBAXIN) 500 mg in dextrose 5 % 50 mL IVPB, 500 mg, Intravenous, Q6H PRN, Newt Minion, MD .  metoCLOPramide (REGLAN) tablet 5-10 mg, 5-10 mg, Oral, Q8H PRN **OR** metoCLOPramide (REGLAN) injection 5-10 mg, 5-10 mg, Intravenous, Q8H PRN, Newt Minion, MD .  ondansetron [facility]) tablet 4 mg, 4 mg, Oral, Q6H PRN, 4 mg at 10/17/20 0827 **OR** ondansetron (ZOFRAN) injection 4 mg, 4 mg, Intravenous, Q6H PRN, Newt Minion, MD .  oxyCODONE (Oxy IR/ROXICODONE) immediate release tablet 10 mg, 10 mg, Oral, Q4H PRN, Barb Merino, MD, 10 mg at 10/20/20 0919 .  polyethylene glycol (MIRALAX / GLYCOLAX) packet 17 g, 17 g, Oral, Daily PRN, Newt Minion, MD, 17 g at 10/17/20 0826 .  sodium chloride tablet 1 g, 1 g, Oral, BID WC, Newt Minion, MD, 1 g at 10/20/20 0919 .  zinc sulfate capsule 220 mg, 220 mg, Oral, Daily, Newt Minion, MD, 220 mg at 10/20/20 0919  Patients Current Diet:  Diet Order            Diet - low sodium heart healthy           Diet Carb Modified Fluid consistency: Thin; Room service appropriate? Yes  Diet effective now                  Precautions / Restrictions Precautions Precautions: Fall Precaution Comments: wound vac Restrictions Weight Bearing Restrictions: Yes RLE Weight Bearing: Non weight bearing   Has the patient had 2 or [name] falls or a fall with injury in the past year? No  Prior Activity Level Community (5-7x/wk): fully independent, working FT as a Forensic scientist, driving, no DME used, walked on him treadmill  3-4x/week  Prior Functional Level Self Care: Did the patient need help bathing, dressing, using the toilet or eating? Independent  Indoor Mobility: Did the patient need assistance with walking from room to room (with or without device)? Independent  Stairs: Did the patient need assistance with internal or external stairs (with or without device)? Independent  Functional Cognition: Did the patient need help planning regular tasks such as shopping or remembering to take medications? Independent  Home Assistive Devices / Equipment Home Assistive Devices/Equipment: Cane (specify quad or straight),Dentures (specify type),Eyeglasses,Hearing aid Home Equipment: Cane - single point  Prior Device Use: Indicate devices/aids used by the patient prior to current illness, exacerbation or injury? was using [facility] for RLE pain immediately prior to admission  Current Functional Level Cognition  Overall Cognitive Status: Within Functional Limits for tasks assessed Orientation Level: Oriented X4 General Comments: Patient with improved pain tolerance this session. Continues to require increased time for mobility, but much improved from yesterday    Extremity Assessment (includes Sensation/Coordination)  Upper Extremity Assessment: Overall WFL for tasks assessed  Lower Extremity Assessment: RLE deficits/detail RLE: Unable to fully assess due to pain RLE Sensation: WNL RLE Coordination: WNL    ADLs  Overall ADL's : Needs assistance/impaired Eating/Feeding: Independent,Sitting Grooming:  Wash/dry hands,Wash/dry face,Min guard,Sitting Upper Body Bathing: Min guard,Sitting Lower Body Bathing: Maximal assistance,Sitting/lateral leans Upper Body Dressing : Min guard,Sitting Lower Body Dressing: Maximal assistance,Sitting/lateral leans Toilet Transfer: Minimal assistance,+2 for safety/equipment Toilet Transfer Details (indicate cue type and reason): simulated to drop arm recliner, lateral scoot Toileting- Clothing Manipulation and Hygiene: Total assistance Functional mobility during ADLs: Minimal assistance,+2 for safety/equipment General ADL Comments: pt and wife educated on complensatory LB ADL technqiues    Mobility  Overal bed mobility: Needs Assistance Bed Mobility: Supine to Sit Supine to sit: Min assist,+2 for safety/equipment Sit to supine: Min guard General bed mobility comments: heavy use of rails, increased time    Transfers  Overall transfer level: Needs assistance Equipment used: None Transfers: Lateral/Scoot Transfers Sit to Stand: Mod assist,From elevated surface  Lateral/Scoot Transfers: Min assist,From elevated surface,+2 safety/equipment General transfer comment: increased time and effort with cues and supervision for safety. Requires encouragement, relaxation technique cues.    Ambulation / Gait / Stairs / Wheelchair Mobility  Ambulation/Gait General Gait Details: unable    Posture / Balance Dynamic Sitting Balance Sitting balance - Comments: able to sit with supervision Balance Overall balance assessment: Needs assistance Sitting-balance support: Feet supported Sitting balance-Leahy Scale: Fair Sitting balance - Comments: able to sit with supervision Postural control: Left lateral lean Standing balance support: Bilateral upper extremity supported,During functional activity Standing balance-Leahy Scale: Poor Standing balance comment: reliant on UE support    Special needs/care consideration Wound Vac yes, Skin RLE amputation and Designated  visitor spouse Lyell Clugston   Previous Home Environment (from acute therapy documentation) Living Arrangements: Spouse/significant other Available Help at Discharge: Family,Available 24 hours/day Type of Home: House Home Layout: One level Home Access: Stairs to enter Entrance Stairs-Rails: Interior and spatial designer of Steps: 4 Home Care Services: No  Discharge Living Setting Plans for Discharge Living Setting: Patient's home Type of Home at Discharge: House Discharge Home Layout: Multi-level,Bed/bath upstairs,1/2 bath on main level Discharge Home Access: Stairs to enter (discussed need for ramp) Entrance Stairs-Number of Steps: 7-8 Discharge Bathroom Shower/Tub: Walk-in shower (upstairs) Discharge Bathroom Toilet: Handicapped height (handicapped upstairs, standard downstairs) Discharge Bathroom Accessibility:  (1/2 bath no, upstairs acc via RW) Does the patient have any problems obtaining your medications?:  No  Social/Family/Support Systems Patient Roles: Spouse Anticipated Caregiver: Rashee Marschall (spouse) Anticipated Caregiver's Contact Information: [phone] Ability/Limitations of Caregiver: supervision Caregiver Availability: 24/7 Discharge Plan Discussed with Primary Caregiver: Yes Is Caregiver In Agreement with Plan?: Yes Does Caregiver/Family have Issues with Lodging/Transportation while Pt is in Rehab?: No  Goals Patient/Family Goal for Rehab: PT/OT supervision to mod I Expected length of stay: 9-12 days Additional Information: worker's comp Pt/Family Agrees to Admission and willing to participate: Yes Program Orientation Provided & Reviewed with Pt/Caregiver Including Roles  & Responsibilities: Yes Additional Information Needs: worker's comp case manager Quentin Angst [phone]  Barriers to Discharge: Inaccessible home environment  Barriers to Discharge Comments: discussed need for ramp and pt/wife agreeable  Decrease burden of Care through  IP rehab admission: n/a  Possible need for SNF placement upon discharge: No. Pt with excellent rehab potential and good support at home.   Patient Condition: I have reviewed medical records from [facility], spoken with CM, and patient and spouse. I met with patient at the bedside for inpatient rehabilitation assessment.  Patient will benefit from ongoing PT and OT, can actively participate in 3 hours of therapy a day 5 days of the week, and can make measurable gains during the admission.  Patient will also benefit from the coordinated team approach during an Inpatient Acute Rehabilitation admission.  The patient will receive intensive therapy as well as Rehabilitation physician, nursing, social worker, and care management interventions.  Due to safety, skin/wound care, medication administration, pain management and patient education the patient requires 24 hour a day rehabilitation nursing.  The patient is currently min with level lateral scoot transfers and basic ADLs.  Discharge setting and therapy post discharge at home with home health is anticipated.  Patient has agreed to participate in the Acute Inpatient Rehabilitation Program and will admit today.  Preadmission Screen Completed By:  Stephania Fragmin, PT, DPT 10/20/2020 9:34 AM ______________________________________________________________________   Discussed status with Dr. Allena Katz  on 10/20/20  at 11:35 AM  and received approval for admission today.  Admission Coordinator:  Stephania Fragmin, PT, DPT time 11:35 AM Dorna Bloom 10/20/20    Assessment/Plan: Diagnosis: R AKA  1. Does the need for close, 24 hr/day Medical supervision in concert with the patient's rehab needs make it unreasonable for this patient to be served in a less intensive setting? Yes  2. Co-Morbidities requiring supervision/potential complications: HTN (monitor and provide prns in accordance with increased physical exertion and pain), post op pain (Biofeedback training with  therapies to help reduce reliance on opiate pain medications, monitor pain control during therapies, and sedation at rest and titrate to maximum efficacy to ensure participation and gains in therapies), ABLA (repeat labs, consider transfusion if necessary to ensure appropriate perfusion for increased activity tolerance) 3. Due to bowel management, safety, skin/wound care, disease management, medication administration, pain management and patient education, does the patient require 24 hr/day rehab nursing? Yes 4. Does the patient require coordinated care of a physician, rehab nurse, PT, OT to address physical and functional deficits in the context of the above medical diagnosis(es)? Yes Addressing deficits in the following areas: balance, endurance, locomotion, strength, transferring, bathing, dressing, toileting and psychosocial support 5. Can the patient actively participate in an intensive therapy program of at least 3 hrs of therapy 5 days a week? Yes 6. The potential for patient to make measurable gains while on inpatient rehab is excellent 7. Anticipated functional outcomes upon discharge from inpatient rehab: modified independent and supervision PT,  modified independent and supervision OT, n/a SLP 8. Estimated rehab length of stay to reach the above functional goals is: 12-16 days. 9. Anticipated discharge destination: Home 10. Overall Rehab/Functional Prognosis: excellent and good   MD Signature: Delice Lesch, MD, ABPMR

## 2020-10-20 NOTE — H&P (Signed)
Physical Medicine and Rehabilitation Admission H&P    Chief Complaint  Patient presents with  . Functional deficits due to R-AKA    HPI:  Andres [name] is a 65 year old male with history of HTN, BCC who was admitted on 10/05/2020 with right lower extremity edema and erythema due to cellulitis.  History taken from chart review and patient.  He also found to be Covid positive but as asymptomatic was treated with remdesivir as well as Rocephin/daptomycin.  MRI of the right lower extremity ordered due to progressive leukocytosis and showed mild ascites of anterior tibialis and soleus muscle. Dr. 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  Wound culture positive for rare MRSA and BC with 1/2 positive for Corynebacterium felt to be contaminant. Has completed 2 week regimen of rocephin/daptomycin and gabapentin was added for management of neuropathy.  Further complicated by postoperative pain and acute blood loss anemia requiring 2 units PRBC. Pre-renal azotemia resolved with IVF for hydration. Therapy ongoing and patient continues to require encouragement with increased time and effort for mobility, has poor safety awareness, limitations in self-care.  As well as fear affecting mobility and ADLs. CIR recommended due to functional decline.  Please see preadmission assessment from earlier today as well.  Review of Systems  Constitutional: Negative for chills and fever.  Gastrointestinal: Positive for constipation.  Musculoskeletal: Positive for myalgias.  Neurological: Positive for weakness. Negative for sensory change, speech change and focal weakness.  All other systems reviewed and are negative.     Past Medical History:  Diagnosis Date  . Basal cell carcinoma   .  Hypertension     Past Surgical History:  Procedure Laterality Date  . AMPUTATION Right 10/15/2020   Procedure: AMPUTATION ABOVE KNEE;  Surgeon: Newt Minion, MD;  Location: [facility];  Service: Orthopedics;  Laterality: Right;  . FASCIOTOMY Right 10/11/2020   Procedure: RIGHT LEG FASCIOTOMIES AND DEBRIDE ULCER;  Surgeon: Newt Minion, MD;  Location: [facility];  Service: Orthopedics;  Laterality: Right;  . I & D EXTREMITY Right 10/13/2020   Procedure: REPEAT DEBRIDEMENT RIGHT LEG;  Surgeon: Newt Minion, MD;  Location: [facility];  Service: Orthopedics;  Laterality: Right;    Family History  Family history unknown: Yes    Social History:  reports that he has quit smoking. He has never used smokeless tobacco. He reports previous alcohol use. He reports that he does not use drugs.    Allergies  Allergen Reactions  . Niacin And Related Shortness Of Breath    Medications Prior to Admission  Medication Sig Dispense Refill  . amoxicillin-clavulanate (AUGMENTIN) 875-125 MG tablet Take 1 tablet by mouth 2 (two) times daily.    . Ascorbic Acid (VITAMIN C) 500 MG CHEW Chew 1 tablet by mouth daily.    Marland Kitchen aspirin 81 MG chewable tablet Chew 1 tablet (81 mg total) by mouth daily.    . B Complex Vitamins (B-COMPLEX/B-12) TABS Take 1 tablet by mouth daily.    Marland Kitchen losartan (COZAAR) 100 MG tablet TAKE 1 TABLET BY MOUTH EVERY DAY (Patient taking differently: Take 100 mg by mouth daily.) 90 tablet 1  . Omega-3 Fatty Acids (FISH OIL) 1000 MG CAPS Take 1 capsule by mouth daily.      Drug Regimen Review  Drug regimen was reviewed and remains appropriate with no significant issues identified  Home: Home Living Family/patient expects to be discharged to:: Private residence Living Arrangements: Spouse/significant other Available Help at Discharge: Family,Available 24 hours/day Type of Home: House Home Access: Stairs to enter [employer] of Steps: 4 Entrance Stairs-Rails: [place]: One  level Home Equipment: [place] - single point   Functional History: Prior Function Level of Independence: Independent Comments: just started using SPC with onset of RLE pain  Functional Status:  Mobility: Bed Mobility Overal bed mobility: Needs Assistance Bed Mobility: Supine to Sit Supine to sit: Min assist,+2 for safety/equipment Sit to supine: Min guard General bed mobility comments: heavy use of rails, increased time Transfers Overall transfer level: Needs assistance Equipment used: None Transfers: Lateral/Scoot Transfers Sit to Stand: Mod assist,From elevated surface  Lateral/Scoot Transfers: Min assist,From elevated surface,+2 safety/equipment General transfer comment: increased time and effort with cues and supervision for safety. Requires encouragement, relaxation technique cues. Ambulation/Gait General Gait Details: unable    ADL: ADL Overall ADL's : Needs assistance/impaired Eating/Feeding: Independent,Sitting Grooming: Wash/dry hands,Wash/dry face,Min guard,Sitting Upper Body Bathing: Min guard,Sitting Lower Body Bathing: Maximal assistance,Sitting/lateral leans Upper Body Dressing : Min guard,Sitting Lower Body Dressing: Maximal assistance,Sitting/lateral leans Toilet Transfer: Minimal assistance,+2 for safety/equipment Toilet Transfer Details (indicate cue type and reason): simulated to drop arm recliner, lateral scoot Toileting- Clothing Manipulation and Hygiene: Total assistance Functional mobility during ADLs: Minimal assistance,+2 for safety/equipment General ADL Comments: pt and wife educated on complensatory LB ADL technqiues  Cognition: Cognition Overall Cognitive Status: Within Functional Limits for tasks assessed Orientation Level: Oriented X4 Cognition Arousal/Alertness: Awake/alert Behavior During Therapy: WFL for tasks assessed/performed Overall Cognitive Status: Within Functional Limits for tasks assessed General Comments: Patient with improved  pain tolerance this session. Continues to require increased time for mobility, but much improved from yesterday   Blood pressure 126/71, pulse 73, temperature 98.6 F (37 C), temperature source Oral, resp. rate 17, height 5\' 11"  (1.803 m), weight 116.9 kg, SpO2 97 %. Physical Exam Vitals reviewed.  Constitutional:      Appearance: He is obese.  HENT:     Head: Normocephalic and atraumatic.     Right Ear: External ear normal.     Left Ear: External ear normal.     Nose: Nose normal.  Eyes:     General:        Right eye: No discharge.        Left eye: No discharge.     Extraocular Movements: Extraocular movements intact.  Cardiovascular:     Rate and Rhythm: Normal rate and regular rhythm.  Pulmonary:     Effort: Pulmonary effort is normal. No respiratory distress.     Breath sounds: No stridor.  Abdominal:     General: There is distension.     Comments: Hypoactive bowel sounds  Musculoskeletal:     Cervical back: Normal range of motion and neck supple.     Comments: Right stump with edema and tenderness  Skin:    Comments: Right AKA with VAC suctioning without leaks  Neurological:     Mental Status: He is alert.     Comments: Motor: Bilateral upper extremities, left lower extremity: 5/5 proximal distal Right lower extremity: Hip flexion: 3/5 (pain inhibition)  Psychiatric:        Mood and Affect: Mood normal.        Behavior: Behavior normal.        Thought Content: Thought content normal.  Results for orders placed or performed during the hospital encounter of 10/05/20 (from the past 48 hour(s))  Hemoglobin A1c     Status: Abnormal   Collection Time: 10/18/20  3:28 PM  Result Value Ref Range   Hgb A1c MFr Bld 5.9 (H) 4.8 - 5.6 %    Comment: (NOTE) Pre diabetes:          5.7%-6.4%  Diabetes:              >6.4%  Glycemic control for   <7.0% adults with diabetes    Mean Plasma Glucose 122.63 mg/dL    Comment: Performed at [facility], [place] [address], [place], [place] [address]  Creatinine, serum     Status: None   Collection Time: 10/19/20  4:54 AM  Result Value Ref Range   Creatinine, Ser 0.82 0.61 - 1.24 mg/dL   GFR, Estimated >60 >60 mL/min    Comment: (NOTE) Calculated using the CKD-EPI Creatinine Equation (2021) Performed at [facility] [address] [place], [place] [address]   CBC with Differential/Platelet     Status: Abnormal   Collection Time: 10/20/20  1:28 AM  Result Value Ref Range   WBC 7.6 4.0 - 10.5 K/uL   RBC 2.48 (L) 4.22 - 5.81 MIL/uL   Hemoglobin 7.0 (L) 13.0 - 17.0 g/dL   HCT 21.6 (L) 39.0 - 52.0 %   MCV 87.1 80.0 - 100.0 fL   MCH 28.2 26.0 - 34.0 pg   MCHC 32.4 30.0 - 36.0 g/dL   RDW 19.1 (H) 11.5 - 15.5 %   Platelets 505 (H) 150 - 400 K/uL   nRBC 0.0 0.0 - 0.2 %   Neutrophils Relative % 58 %   Neutro Abs 4.4 1.7 - 7.7 K/uL   Lymphocytes Relative 31 %   Lymphs Abs 2.4 0.7 - 4.0 K/uL   Monocytes Relative 6 %   Monocytes Absolute 0.5 0.1 - 1.0 K/uL   Eosinophils Relative 3 %   Eosinophils Absolute 0.2 0.0 - 0.5 K/uL   Basophils Relative 1 %   Basophils Absolute 0.1 0.0 - 0.1 K/uL   Immature Granulocytes 1 %   Abs Immature Granulocytes 0.04 0.00 - 0.07 K/uL    Comment: Performed at [facility] [address] [place], [place] [address]   No results found.     Medical Problem List and Plan: 1.  Deficits with mobility, transfers, self-care secondary to right AKA due to necrotizing fasciitis  -patient may not shower  -ELOS/Goals: 12 to 16 days/supervision/mod I  Admit to CIR 2.  Antithrombotics: -DVT/anticoagulation:  Pharmaceutical: Lovenox  -antiplatelet therapy: ASA 3. Pain Management: Oxycodone as needed  Monitor with increased exertion, particularly for neuropathic pain 4. Mood: Team to provide ego support.   -antipsychotic agents: N/A 5. Neuropsych: This patient is capable of making decisions on his own behalf. 6. Skin/Wound Care: Wound VAC to be d/c POD #7  (2/20).  --On vitamin C and Zinc to promote wound healing.  7. Fluids/Electrolytes/Nutrition: Monitor I/Os.  CMP ordered 8. HTN: Monitor BP tid and monitor for trends.   --Off losartan at this time.    Moderate increased mobility 9. Acute blood loss anemia:   Hemoglobin 7.0, CBC ordered  --received 2 units on 02/13  --Received Feraheme today--repeat in a week.  10. Recent Covid: Encourage pulmonary hygiene  --Continue Tessalon perles tid. 11. Pre-diabetes: Hgb A1C-5.9. Continue Carb modified diet.   Monitor increase mobility 12. Hyponatremia:  Continue salt tabs for now.   CMP ordered  Bary Leriche, PA-C 10/20/2020  I have personally performed a face to face diagnostic evaluation, including, but not limited to relevant history and physical exam findings, of this patient and developed relevant assessment and plan.  Additionally, I have reviewed and concur with the physician assistant's documentation above.  Delice Lesch, MD, ABPMR  The patient's status has not changed. Any changes from the pre-admission screening or documentation from the acute chart are noted above.   Delice Lesch, MD, ABPMR

## 2020-10-20 NOTE — Discharge Summary (Signed)
Physician Discharge Summary  Andres [name] [id number] DOB: [birth year] DOA: 10/05/2020  PCP: Mackie Pai, PA-C  Admit date: 10/05/2020 Discharge date: 10/20/2020  Admitted From: Home Disposition: Acute inpatient rehab  Recommendations for Outpatient Follow-up:  1. Follow up with PCP in 1-2 weeks after discharge from the hospital 2. Please recheck CBC in 2 days to ensure stabilization. 3. Orthopedics to follow-up regarding wound care.  Home Health: Going to [facility] Equipment/Devices: Not applicable  Discharge Condition: Stable CODE STATUS: Full code Diet recommendation: Low-salt diet  Discharge summary:  65 year old gentleman with history of hypertension presented to the hospital with right lower extremity cellulitis, failed outpatient therapy with antibiotics on 10/05/20.  MRI showed diffuse subcutaneous soft tissue swelling and edema with no evidence of abscess.  Treated with Rocephin and daptomycin.  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   Assessment & Plan:   Principal Problem:   Cellulitis of right lower extremity Active Problems:   Essential hypertension   Cellulitis   Hyponatremia   COVID-19 virus infection   Abscess of right lower leg   Necrotizing fasciitis of lower leg (HCC)  # Necrotizing fasciitis of the right lower extremity with myositis secondary to MRSA infection: Treated with broad-spectrum antibiotics 2/3-2/9.  Did not improve. 2/9 right leg fasciotomy for compartment syndrome and excision of soft tissue muscle and fascia. 2/11 repeat debridement, found to have multiple necrotic muscles and nonsalvageable limb. 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 Blood cultures 1/2 with Corynebacterium likely contaminant Wound culture with rare MRSA. Rocephin and daptomycin until 2/16 to complete 2 weeks of therapy. Currently pain controlled with oral  oxycodone, gabapentin. Wound VAC management as per surgery.  They will continue to follow. Stable to transfer to acute inpatient rehab.  # Incidental COVID-19 infection: Completed IV remdesivir.  Currently off isolation.  # Hypovolemic hyponatremia: Improved.  # Acute renal failure: Improved.  # Essential hypertension: Blood pressure stable.  # Acute blood loss anemia: Hemoglobin was less than 7.  Received 2 units PRBC on 2/13.  Hemoglobin remains 7-8 range since then.  No active bleeding. Hemoglobin 7 on 2/18.  1 unit of Feraheme given. We will put patient on scheduled iron supplements. Please recheck hemoglobin in 2 to 3 days to ensure stabilization.  Patient is clinically stable to transfer to acute inpatient rehab to continue aggressive and multidisciplinary rehab.  Patient had rare MRSA infection on his leg wound, nasal swab was negative.  Blood cultures were without MRSA. He is on contact isolation, however is not clinically significant.  His infected tissue was removed to clean margin. Contact precaution for MRSA as per receiving institutions policy.   Discharge Diagnoses:  Principal Problem:   Cellulitis of right lower extremity Active Problems:   Essential hypertension   Cellulitis   Hyponatremia   COVID-19 virus infection   Abscess of right lower leg   Necrotizing fasciitis of lower leg [facility])    Discharge Instructions  Discharge Instructions    Call MD for:  redness, tenderness, or signs of infection (pain, swelling, redness, odor or green/yellow discharge around incision site)   Complete by: As directed    Call MD for:  redness, tenderness, or signs of infection (pain, swelling, redness, odor or green/yellow discharge around incision site)   Complete by: As directed    Call MD for:  severe uncontrolled pain   Complete by: As directed    Call MD for:  severe  uncontrolled pain   Complete by: As directed    Call MD for:  temperature >100.4   Complete by:  As directed    Diet - low sodium heart healthy   Complete by: As directed    Discharge wound care:   Complete by: As directed    Wound care instructions   Discharge wound care:   Complete by: As directed    Reinforce dressing, keep wound vac. Will be followed by orthopedics.   Increase activity slowly   Complete by: As directed    Increase activity slowly   Complete by: As directed      Allergies as of 10/20/2020      Reactions   Niacin And Related Shortness Of Breath      Medication List    STOP taking these medications   amoxicillin-clavulanate 875-125 MG tablet Commonly known as: AUGMENTIN     TAKE these medications   aspirin 81 MG chewable tablet Chew 1 tablet (81 mg total) by mouth daily.   B-Complex/B-12 Tabs Take 1 tablet by mouth daily.   docusate sodium 100 MG capsule Commonly known as: COLACE Take 1 capsule (100 mg total) by mouth 2 (two) times daily.   ferrous sulfate 325 (65 FE) MG EC tablet Take 1 tablet (325 mg total) by mouth 2 (two) times daily.   Fish Oil 1000 MG Caps Take 1 capsule by mouth daily.   gabapentin 100 MG capsule Commonly known as: NEURONTIN Take 2 capsules (200 mg total) by mouth 3 (three) times daily.   losartan 100 MG tablet Commonly known as: COZAAR TAKE 1 TABLET BY MOUTH EVERY DAY   methocarbamol 500 MG tablet Commonly known as: ROBAXIN Take 1 tablet (500 mg total) by mouth every 6 (six) hours as needed for muscle spasms.   Oxycodone HCl 10 MG Tabs Take 1 tablet (10 mg total) by mouth every 4 (four) hours as needed for up to 5 days for severe pain.   polyethylene glycol 17 g packet Commonly known as: MIRALAX / GLYCOLAX Take 17 g by mouth daily as needed for mild constipation.   Vitamin C 500 MG Chew Chew 1 tablet by mouth daily.            Discharge Care Instructions  (From admission, onward)         Start     Ordered   10/20/20 0000  Discharge wound care:       Comments: Reinforce dressing, keep wound  vac. Will be followed by orthopedics.   10/20/20 1129   10/19/20 0000  Discharge wound care:       Comments: Wound care instructions   10/19/20 1513          Follow-up Information    Saguier, Percell Miller, PA-C Follow up in 3 week(s).   Specialties: Internal Medicine, Family Medicine Contact information: [address] [place] [address] [phone]              Allergies  Allergen Reactions  . Niacin And Related Shortness Of Breath    Consultations:  Orthopedics   Procedures/Studies: DG Tibia/Fibula Right  Result Date: 10/05/2020 CLINICAL DATA:  Wound.  Cellulitis possible sepsis EXAM: RIGHT TIBIA AND FIBULA - 2 VIEW COMPARISON:  None. FINDINGS: There is no evidence of fracture or other focal bone lesions. Soft tissues are unremarkable. IMPRESSION: Negative. Electronically Signed   By: Franchot Gallo M.D.   On: 10/05/2020 16:30   MR TIBIA FIBULA RIGHT WO CONTRAST  Result Date: 10/06/2020 CLINICAL DATA:  Draining.  Evaluate for osteomyelitis. EXAM: MRI OF LOWER RIGHT EXTREMITY WITHOUT CONTRAST TECHNIQUE: Multiplanar, multisequence MR imaging of the right lower extremity was performed. No intravenous contrast was administered. COMPARISON:  Radiographs 10/05/2020 FINDINGS: Diffuse and fairly marked subcutaneous soft tissue swelling/edema/fluid consistent with severe cellulitis. There is also myositis mainly involving the anterior tibialis muscle but also the medial soleus muscle which is also surrounded by fluid. I do not see any definite findings to suggest pyomyositis. No discrete subcutaneous soft tissue abscess is identified. There is a skin blister noted along the medial aspect of the tibia. I do not see any findings suspicious for osteomyelitis. IMPRESSION: 1. Diffuse and fairly marked subcutaneous soft tissue swelling/edema/fluid consistent with severe cellulitis. No obvious discrete drainable soft tissue abscess. 2. Myositis mainly involving the anterior  tibialis muscle but also the medial soleus muscle. 3. No definite findings for pyomyositis. 4. No findings for osteomyelitis. Electronically Signed   By: Marijo Sanes M.D.   On: 10/06/2020 14:01   US Venous Img Lower Right (DVT Study)  Result Date: 10/05/2020 CLINICAL DATA:  Edema, pain and erythema right lower extremity. EXAM: RIGHT LOWER EXTREMITY VENOUS DOPPLER ULTRASOUND TECHNIQUE: Gray-scale sonography with graded compression, as well as color Doppler and duplex ultrasound were performed to evaluate the lower extremity deep venous systems from the level of the common femoral vein and including the common femoral, femoral, profunda femoral, popliteal and calf veins including the posterior tibial, peroneal and gastrocnemius veins when visible. The superficial great saphenous vein was also interrogated. Spectral Doppler was utilized to evaluate flow at rest and with distal augmentation maneuvers in the common femoral, femoral and popliteal veins. COMPARISON:  None. FINDINGS: Contralateral Common Femoral Vein: Respiratory phasicity is normal and symmetric with the symptomatic side. No evidence of thrombus. Normal compressibility. Common Femoral Vein: No evidence of thrombus. Normal compressibility, respiratory phasicity and response to augmentation. Saphenofemoral Junction: No evidence of thrombus. Normal compressibility and flow on color Doppler imaging. Profunda Femoral Vein: No evidence of thrombus. Normal compressibility and flow on color Doppler imaging. Femoral Vein: No evidence of thrombus. Normal compressibility, respiratory phasicity and response to augmentation. Popliteal Vein: No evidence of thrombus. Normal compressibility, respiratory phasicity and response to augmentation. Calf Veins: No evidence of thrombus. Normal compressibility and flow on color Doppler imaging. Superficial Great Saphenous Vein: No evidence of thrombus. Normal compressibility. Venous Reflux:  None. Other Findings: No evidence  of superficial thrombophlebitis or abnormal fluid collection. IMPRESSION: No evidence of right lower extremity deep venous thrombosis. Electronically Signed   By: Aletta Edouard M.D.   On: 10/05/2020 15:26   DG Chest Port 1 View  Result Date: 10/05/2020 CLINICAL DATA:  COVID positive.  Sepsis. EXAM: PORTABLE CHEST 1 VIEW COMPARISON:  11/08/2018 FINDINGS: Cardiac enlargement without heart failure. Lungs are well aerated and clear. No infiltrate or effusion. 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 IMPRESSION: No active disease. Electronically Signed   By: Franchot Gallo M.D.   On: 10/05/2020 17:50   (Echo, Carotid, EGD, Colonoscopy, ERCP)    Subjective: Patient seen and examined.  Wife at the bedside.  He has been using oxycodone, had some pain last night but controlled with oxycodone.  No other events.  Looking forward to go to rehab.     Discharge Exam: Vitals:   10/20/20 0509 10/20/20 0807  BP: 125/75 126/71  Pulse: 79 73  Resp: 18 17  Temp: 98.7 F (37.1 C) 98.6 F (37 C)  SpO2: 95% 97%   Vitals:   10/19/20 1924 10/20/20 0509 10/20/20 0509 10/20/20 0807  BP: 125/61 125/75 125/75 126/71  Pulse: 83 79 79 73  Resp: 17 16 18 17   Temp: 98.1 F (36.7 C) 98.7 F (37.1 C) 98.7 F (37.1 C) 98.6 F (37 C)  TempSrc: Oral Oral Oral Oral  SpO2: 100% 95% 95% 97%  Weight:      Height:        General: Pt is alert, awake, not in acute distress Cardiovascular: RRR, S1/S2 +, no rubs, no gallops Respiratory: CTA bilaterally, no wheezing, no rhonchi Abdominal: Soft, NT, ND, bowel sounds + Extremities: no edema, no cyanosis Right AKA wound with wound vac dressing on, surrounding skin looks clean and dry. Cannister with no fluid.     The results of significant diagnostics from this hospitalization (including imaging, microbiology, ancillary and laboratory) are listed below for reference.     Microbiology: Recent Results (from the past 240 hour(s))  MRSA PCR Screening      Status: None   Collection Time: 10/10/20  8:29 PM   Specimen: Nasal Mucosa; Nasopharyngeal  Result Value Ref Range Status   MRSA by PCR NEGATIVE NEGATIVE Final    Comment:        The GeneXpert MRSA Assay (FDA approved for NASAL specimens only), is one component of a comprehensive MRSA colonization surveillance program. It is not intended to diagnose MRSA infection nor to guide or monitor treatment for MRSA infections. Performed at [facility], [place] [address] [place], [place] [address]   Aerobic/Anaerobic Culture (surgical/deep wound)     Status: None   Collection Time: 10/11/20  3:41 PM   Specimen: Soft Tissue, Other  Result Value Ref Range Status   Specimen Description TISSUE RIGHT LEG  Final   Special Requests NONE  Final   Gram Stain   Final    RARE WBC PRESENT, PREDOMINANTLY MONONUCLEAR NO ORGANISMS SEEN    Culture   Final    RARE METHICILLIN RESISTANT STAPHYLOCOCCUS AUREUS CRITICAL RESULT CALLED TO, READ BACK BY AND VERIFIED WITH: RN V.MYAGAI AT 0932 ON 10/13/2020 BY T.SAAD NO ANAEROBES ISOLATED Performed at [facility], [place] [address] [place], [place] [address]    Report Status 10/16/2020 FINAL  Final   Organism ID, Bacteria METHICILLIN RESISTANT STAPHYLOCOCCUS AUREUS  Final      Susceptibility   Methicillin resistant staphylococcus aureus - MIC*    CIPROFLOXACIN >=8 RESISTANT Resistant     ERYTHROMYCIN >=8 RESISTANT Resistant     GENTAMICIN <=0.5 SENSITIVE Sensitive     OXACILLIN >=4 RESISTANT Resistant     TETRACYCLINE <=1 SENSITIVE Sensitive     VANCOMYCIN <=0.5 SENSITIVE Sensitive     TRIMETH/SULFA 160 RESISTANT Resistant     CLINDAMYCIN <=0.25 SENSITIVE Sensitive     RIFAMPIN <=0.5 SENSITIVE Sensitive     Inducible Clindamycin NEGATIVE Sensitive     * RARE METHICILLIN RESISTANT STAPHYLOCOCCUS AUREUS  Aerobic/Anaerobic Culture (surgical/deep wound)     Status: None   Collection Time: 10/13/20  2:14 PM   Specimen: Leg, Right; Tissue   Result Value Ref Range Status   Specimen Description TISSUE RIGHT LOWER LEG  Final   Special Requests RT LOWER LEG MUSCLE  Final   Gram Stain   Final    FEW WBC PRESENT, PREDOMINANTLY PMN NO ORGANISMS SEEN    Culture   Final    No growth aerobically or anaerobically. Performed at [facility], [place]  [address] [place], [place] [address]    Report Status 10/18/2020 FINAL  Final  Surgical pcr screen     Status: None   Collection Time: 10/15/20  1:24 AM   Specimen: Nasal Mucosa; Nasal Swab  Result Value Ref Range Status   MRSA, PCR NEGATIVE NEGATIVE Final   Staphylococcus aureus NEGATIVE NEGATIVE Final    Comment: (NOTE) The Xpert SA Assay (FDA approved for NASAL specimens in patients 80 years of age and older), is one component of a comprehensive surveillance program. It is not intended to diagnose infection nor to guide or monitor treatment. Performed at [facility], [place] [address], [place], [place] [address]      Labs: BNP (last 3 results) No results for input(s): BNP in the last 8760 hours. Basic Metabolic Panel: Recent Labs  Lab 10/14/20 0226 10/15/20 0245 10/19/20 0454  NA 131* 134*  --   K 5.1 4.1  --   CL 102 103  --   CO2 21* 23  --   GLUCOSE 183* 155*  --   BUN 15 15  --   CREATININE 0.86 0.88 0.82  CALCIUM 7.8* 7.5*  --    Liver Function Tests: No results for input(s): AST, ALT, ALKPHOS, BILITOT, PROT, ALBUMIN in the last 168 hours. No results for input(s): LIPASE, AMYLASE in the last 168 hours. No results for input(s): AMMONIA in the last 168 hours. CBC: Recent Labs  Lab 10/14/20 0226 10/15/20 0245 10/15/20 1146 10/16/20 0343 10/20/20 0128  WBC 18.4* 10.6* 14.1* 11.9* 7.6  NEUTROABS 15.7* 7.2 12.0* 9.0* 4.4  HGB 8.5* 6.4* 8.6* 7.7* 7.0*  HCT 26.2* 19.0* 25.5* 22.3* 21.6*  MCV 87.3 86.0 82.3 83.5 87.1  PLT 428* 375 422* 396 505*   Cardiac Enzymes: No results for input(s): CKTOTAL, CKMB, CKMBINDEX, TROPONINI in the last 168  hours. BNP: Invalid input(s): POCBNP CBG: No results for input(s): GLUCAP in the last 168 hours. D-Dimer No results for input(s): DDIMER in the last 72 hours. Hgb A1c Recent Labs    10/18/20 1528  HGBA1C 5.9*   Lipid Profile No results for input(s): CHOL, HDL, LDLCALC, TRIG, CHOLHDL, LDLDIRECT in the last 72 hours. Thyroid function studies No results for input(s): TSH, T4TOTAL, T3FREE, THYROIDAB in the last 72 hours.  Invalid input(s): FREET3 Anemia work up No results for input(s): VITAMINB12, FOLATE, FERRITIN, TIBC, IRON, RETICCTPCT in the last 72 hours. Urinalysis    Component Value Date/Time   COLORURINE YELLOW 10/05/2020 1800   APPEARANCEUR CLEAR 10/05/2020 1800   LABSPEC 1.010 10/05/2020 1800   PHURINE 6.0 10/05/2020 1800   GLUCOSEU NEGATIVE 10/05/2020 1800   HGBUR NEGATIVE 10/05/2020 1800   BILIRUBINUR NEGATIVE 10/05/2020 1800   KETONESUR NEGATIVE 10/05/2020 1800   PROTEINUR 30 (A) 10/05/2020 1800   NITRITE NEGATIVE 10/05/2020 1800   LEUKOCYTESUR NEGATIVE 10/05/2020 1800   Sepsis Labs Invalid input(s): PROCALCITONIN,  WBC,  LACTICIDVEN Microbiology Recent Results (from the past 240 hour(s))  MRSA PCR Screening     Status: None   Collection Time: 10/10/20  8:29 PM   Specimen: Nasal Mucosa; Nasopharyngeal  Result Value Ref Range Status   MRSA by PCR NEGATIVE NEGATIVE Final    Comment:        The GeneXpert MRSA Assay (FDA approved for NASAL specimens only), is one component of a comprehensive MRSA colonization surveillance program. It is not intended to diagnose MRSA infection nor to guide or monitor treatment for MRSA infections. Performed at [facility], [place]  [address] [place], [place] [address]   Aerobic/Anaerobic Culture (surgical/deep wound)     Status: None   Collection Time: 10/11/20  3:41 PM   Specimen: Soft Tissue, Other  Result Value Ref Range Status   Specimen Description TISSUE RIGHT LEG  Final   Special Requests NONE  Final   Gram  Stain   Final    RARE WBC PRESENT, PREDOMINANTLY MONONUCLEAR NO ORGANISMS SEEN    Culture   Final    RARE METHICILLIN RESISTANT STAPHYLOCOCCUS AUREUS CRITICAL RESULT CALLED TO, READ BACK BY AND VERIFIED WITH: RN V.MYAGAI AT 4128 ON 10/13/2020 BY T.SAAD NO ANAEROBES ISOLATED Performed at [facility], [place] [address] [place], [place] [address]    Report Status 10/16/2020 FINAL  Final   Organism ID, Bacteria METHICILLIN RESISTANT STAPHYLOCOCCUS AUREUS  Final      Susceptibility   Methicillin resistant staphylococcus aureus - MIC*    CIPROFLOXACIN >=8 RESISTANT Resistant     ERYTHROMYCIN >=8 RESISTANT Resistant     GENTAMICIN <=0.5 SENSITIVE Sensitive     OXACILLIN >=4 RESISTANT Resistant     TETRACYCLINE <=1 SENSITIVE Sensitive     VANCOMYCIN <=0.5 SENSITIVE Sensitive     TRIMETH/SULFA 160 RESISTANT Resistant     CLINDAMYCIN <=0.25 SENSITIVE Sensitive     RIFAMPIN <=0.5 SENSITIVE Sensitive     Inducible Clindamycin NEGATIVE Sensitive     * RARE METHICILLIN RESISTANT STAPHYLOCOCCUS AUREUS  Aerobic/Anaerobic Culture (surgical/deep wound)     Status: None   Collection Time: 10/13/20  2:14 PM   Specimen: Leg, Right; Tissue  Result Value Ref Range Status   Specimen Description TISSUE RIGHT LOWER LEG  Final   Special Requests RT LOWER LEG MUSCLE  Final   Gram Stain   Final    FEW WBC PRESENT, PREDOMINANTLY PMN NO ORGANISMS SEEN    Culture   Final    No growth aerobically or anaerobically. Performed at [facility], [place] [address], [place], [place] [address]    Report Status 10/18/2020 FINAL  Final  Surgical pcr screen     Status: None   Collection Time: 10/15/20  1:24 AM   Specimen: Nasal Mucosa; Nasal Swab  Result Value Ref Range Status   MRSA, PCR NEGATIVE NEGATIVE Final   Staphylococcus aureus NEGATIVE NEGATIVE Final    Comment: (NOTE) The Xpert SA Assay (FDA approved for NASAL specimens in patients 60 years of age and older), is one component of a  comprehensive surveillance program. It is not intended to diagnose infection nor to guide or monitor treatment. Performed at [facility], [place] [address] [place], [place] [address]      Time coordinating discharge:  minutes  SIGNED:   Barb Merino, MD  [facility] 10/20/2020, 11:37 AM

## 2020-10-20 NOTE — Progress Notes (Addendum)
During this treatment session, the therapist was present, participating in and directing the treatment. The therapist read, reviewed and agrees with student's note.   [place] W,PT Acute Rehabilitation [phone] (pager)       Physical Therapy Treatment Patient Details Name: Andres [name] MRN: [id number] DOB: 29-Jan-1956 Today's Date: 10/20/2020    History of Present Illness 65 y.o. 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  Was also noted Covid positive on admission.  PMHx:  basal cell CA, HTN,    PT Comments    Pt received in the middle of completing a lateral/scoot transfer from bed to chair with min guard from OT. During PT session, was able to perform sit to stand from chair on 2nd attempt with RW and mod Ax2. Needed to push with one hand on RW and one hand on chair for more power into standing. Reliant on B UE support and external assistance in standing, but no overt LOB. Pt educated on importance of R hip extension in preparation to promote normal gait pattern with future prosthetic. Also emphasized L LE strengthening to maintain function. Pt cooperative, pleasant, and very motivated. Pt would benefit from continued skilled PT to address strength, endurance, balance, and to gain independency with mobility. Educated pt on importance of awareness of abilities/deficits to promote safe decision making in relation to mobility. Pt left in chair with all needs met, call bell within reach, wife present in room, and nursing aware of status.   Follow Up Recommendations  CIR;Supervision for mobility/OOB     Equipment Recommendations  Rolling walker with 5" wheels;3in1 (PT);Wheelchair (measurements PT);Wheelchair cushion (measurements PT) (TBD)    Recommendations for  Other Services      Precautions / Restrictions Precautions Precautions: Fall Precaution Comments: wound vac on R Restrictions Weight Bearing Restrictions: Yes RLE Weight Bearing: Non weight bearing    Mobility  Bed Mobility Overal bed mobility:           General bed mobility comments: Pt received working on lateral transfer with OT    Transfers Overall transfer level: Needs assistance Equipment used: Rolling walker (2 wheeled) Transfers: Lateral/Scoot Transfers;Sit to/from Stand Sit to Stand: Mod assist;+2 physical assistance        Lateral/Scoot Transfers: Min guard (Observed lateral/scoot transfer completed with OT) General transfer comment: Two attempts to get to standing, tried pushing from edge of chair, needed to push with one hand on RW for more power up to stand  Ambulation/Gait             General Gait Details: unable   Stairs             Wheelchair Mobility    Modified Rankin (Stroke Patients Only)       Balance Overall balance assessment: Needs assistance Sitting-balance support: Feet supported Sitting balance-Leahy Scale: Fair Sitting balance - Comments: able to sit with supervision   Standing balance support: Bilateral upper extremity supported Standing balance-Leahy Scale: Poor Standing balance comment: Reliant on B UE support and external assistance                            Cognition Arousal/Alertness: Awake/alert Behavior During Therapy: WFL for tasks assessed/performed Overall Cognitive Status: Within Functional Limits for tasks assessed  General Comments: Increased time for mobility due to pain. Overall cooperative, pleasant, and motivated.      Exercises      General Comments General comments (skin integrity, edema, etc.): Educated on importance of L LE strength/mobility to maintain function and on R hip extension to promote normal gait pattern in preparation  for prosthetic. Verbally educated on hip extension in sidelying, glute sqeezes, supine hip abduction, LAQs, and hip flexion.      Pertinent Vitals/Pain Pain Assessment: Faces Faces Pain Scale: Hurts even more Pain Location: R LE with movement Pain Descriptors / Indicators: Operative site guarding;Grimacing;Guarding Pain Intervention(s): Monitored during session;Premedicated before session    Home Living                      Prior Function            PT Goals (current goals can now be found in the care plan section) Acute Rehab PT Goals Patient Stated Goal: return to independence with mobilitly, return to work PT Goal Formulation: With patient Time For Goal Achievement: 11/04/20 Potential to Achieve Goals: Good Progress towards PT goals: Progressing toward goals    Frequency    Min 3X/week      PT Plan Current plan remains appropriate    Co-evaluation              AM-PAC PT "6 Clicks" Mobility   Outcome Measure  Help needed turning from your back to your side while in a flat bed without using bedrails?: A Lot Help needed moving from lying on your back to sitting on the side of a flat bed without using bedrails?: A Lot Help needed moving to and from a bed to a chair (including a wheelchair)?: A Little Help needed standing up from a chair using your arms (e.g., wheelchair or bedside chair)?: A Lot Help needed to walk in hospital room?: Total Help needed climbing 3-5 steps with a railing? : Total 6 Click Score: 11    End of Session Equipment Utilized During Treatment: Gait belt Activity Tolerance: Patient limited by pain;Patient tolerated treatment well;Patient limited by fatigue Patient left: in chair;with call bell/phone within reach;with family/visitor present Nurse Communication: Mobility status PT Visit Diagnosis: Other abnormalities of gait and mobility (R26.89);Pain;Muscle weakness (generalized) (M62.81) Pain - Right/Left: Right Pain - part of  body: Leg     Time:  - 2878-6767    Charges:     Exercise 8-22 min        Therapeutic Activity 8-22 min            Rosita Kea, SPT

## 2020-10-20 NOTE — Progress Notes (Signed)
Patient arrived on unit, oriented to unit. Reviewed medications, therapy schedule, rehab routine and plan of care. States an understanding of information reviewed. No complications noted at this time. Patient reports no pain and is AX4 Andres [name] [name]  

## 2020-10-20 NOTE — Progress Notes (Signed)
Inpatient Rehab Admissions Coordinator:    I have approval from worker's comp and a bed available for pt to admit to CIR today. Dr. Sloan Leiter in agreement.  Will let pt/family and TOC team know.   Shann Medal, PT, DPT Admissions Coordinator [phone] 10/20/20  11:24 AM

## 2020-10-20 NOTE — Progress Notes (Addendum)
Inpatient Rehabilitation Medication Review by a Pharmacist  A complete drug regimen review was completed for this patient to identify any potential clinically significant medication issues.  Clinically significant medication issues were identified:  Yes   Type of Medication Issue Identified Description of Issue Urgent (address now) Non-Urgent (address on AM team rounds) Plan Plan Accepted by Provider? (Yes / No / Pending AM Rounds)  Drug Interaction(s) (clinically significant)       Duplicate Therapy       Allergy       No Medication Administration End Date       Incorrect Dose       Additional Drug Therapy Needed       Other  The following medications were listed on pt's discharge med list from [facility] (these medications were on pt's home med list, but pt did not receive them at [facility] and they were not ordered on admission to CIR): B complex multivitamin Fish oil capsule Non urgent Pending review by CIR team on AM rounds Pending AM rounds    Name of provider notified for urgent issues identified:  N/A  For non-urgent medication issues to be resolved on team rounds tomorrow morning a [facility] Secure Chat Handoff was sent to:  N/A (CIR team to review on AM rounds)  Time spent performing this drug regimen review (minutes):  [place], PharmD, BCPS, [facility] Clinical Pharmacist 10/20/2020 4:20 PM

## 2020-10-20 NOTE — Progress Notes (Signed)
Report given to [facility] on 3M. Pt going to CIR today.

## 2020-10-20 NOTE — H&P (Signed)
Physical Medicine and Rehabilitation Admission H&P    Chief Complaint  Patient presents with  . Functional deficits due to R-AKA    HPI:  Andres [name] is a 65 year old male with history of HTN, BCC who was admitted on 10/05/2020 with right lower extremity edema and erythema due to cellulitis.  History taken from chart review and patient.  He also found to be Covid positive but as asymptomatic was treated with remdesivir as well as Rocephin/daptomycin.  MRI of the right lower extremity ordered due to progressive leukocytosis and showed mild ascites of anterior tibialis and soleus muscle. Dr. 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  Wound culture positive for rare MRSA and BC with 1/2 positive for Corynebacterium felt to be contaminant. Has completed 2 week regimen of rocephin/daptomycin and gabapentin was added for management of neuropathy.  Further complicated by postoperative pain and acute blood loss anemia requiring 2 units PRBC. Pre-renal azotemia resolved with IVF for hydration. Therapy ongoing and patient continues to require encouragement with increased time and effort for mobility, has poor safety awareness, limitations in self-care.  As well as fear affecting mobility and ADLs. CIR recommended due to functional decline.  Please see preadmission assessment from earlier today as well.  Review of Systems  Constitutional: Negative for chills and fever.  Gastrointestinal: Positive for constipation.  Musculoskeletal: Positive for myalgias.  Neurological: Positive for weakness. Negative for sensory change, speech change and focal weakness.  All other systems reviewed and are negative.     Past Medical History:  Diagnosis Date  . Basal cell carcinoma   .  Hypertension     Past Surgical History:  Procedure Laterality Date  . AMPUTATION Right 10/15/2020   Procedure: AMPUTATION ABOVE KNEE;  Surgeon: Newt Minion, MD;  Location: [place];  Service: Orthopedics;  Laterality: Right;  . FASCIOTOMY Right 10/11/2020   Procedure: RIGHT LEG FASCIOTOMIES AND DEBRIDE ULCER;  Surgeon: Newt Minion, MD;  Location: [place];  Service: Orthopedics;  Laterality: Right;  . I & D EXTREMITY Right 10/13/2020   Procedure: REPEAT DEBRIDEMENT RIGHT LEG;  Surgeon: Newt Minion, MD;  Location: [place];  Service: Orthopedics;  Laterality: Right;    Family History  Family history unknown: Yes    Social History:  reports that he has quit smoking. He has never used smokeless tobacco. He reports previous alcohol use. He reports that he does not use drugs.    Allergies  Allergen Reactions  . Niacin And Related Shortness Of Breath    Medications Prior to Admission  Medication Sig Dispense Refill  . amoxicillin-clavulanate (AUGMENTIN) 875-125 MG tablet Take 1 tablet by mouth 2 (two) times daily.    . Ascorbic Acid (VITAMIN C) 500 MG CHEW Chew 1 tablet by mouth daily.    Marland Kitchen aspirin 81 MG chewable tablet Chew 1 tablet (81 mg total) by mouth daily.    . B Complex Vitamins (B-COMPLEX/B-12) TABS Take 1 tablet by mouth daily.    Marland Kitchen losartan (COZAAR) 100 MG tablet TAKE 1 TABLET BY MOUTH EVERY DAY (Patient taking differently: Take 100 mg by mouth daily.) 90 tablet 1  . Omega-3 Fatty Acids (FISH OIL) 1000 MG CAPS Take 1 capsule by mouth daily.      Drug Regimen Review  Drug regimen was reviewed and remains appropriate with no significant issues identified  Home: Home Living Family/patient expects to be discharged to:: Private residence Living Arrangements: Spouse/significant other Available Help at Discharge: Family,Available 24 hours/day Type of Home: House Home Access: Stairs to enter [employer] of Steps: 4 Entrance Stairs-Rails: [place]: One  level Home Equipment: [place] - single point   Functional History: Prior Function Level of Independence: Independent Comments: just started using SPC with onset of RLE pain  Functional Status:  Mobility: Bed Mobility Overal bed mobility: Needs Assistance Bed Mobility: Supine to Sit Supine to sit: Min assist,+2 for safety/equipment Sit to supine: Min guard General bed mobility comments: heavy use of rails, increased time Transfers Overall transfer level: Needs assistance Equipment used: None Transfers: Lateral/Scoot Transfers Sit to Stand: Mod assist,From elevated surface  Lateral/Scoot Transfers: Min assist,From elevated surface,+2 safety/equipment General transfer comment: increased time and effort with cues and supervision for safety. Requires encouragement, relaxation technique cues. Ambulation/Gait General Gait Details: unable    ADL: ADL Overall ADL's : Needs assistance/impaired Eating/Feeding: Independent,Sitting Grooming: Wash/dry hands,Wash/dry face,Min guard,Sitting Upper Body Bathing: Min guard,Sitting Lower Body Bathing: Maximal assistance,Sitting/lateral leans Upper Body Dressing : Min guard,Sitting Lower Body Dressing: Maximal assistance,Sitting/lateral leans Toilet Transfer: Minimal assistance,+2 for safety/equipment Toilet Transfer Details (indicate cue type and reason): simulated to drop arm recliner, lateral scoot Toileting- Clothing Manipulation and Hygiene: Total assistance Functional mobility during ADLs: Minimal assistance,+2 for safety/equipment General ADL Comments: pt and wife educated on complensatory LB ADL technqiues  Cognition: Cognition Overall Cognitive Status: Within Functional Limits for tasks assessed Orientation Level: Oriented X4 Cognition Arousal/Alertness: Awake/alert Behavior During Therapy: WFL for tasks assessed/performed Overall Cognitive Status: Within Functional Limits for tasks assessed General Comments: Patient with improved  pain tolerance this session. Continues to require increased time for mobility, but much improved from yesterday   Blood pressure 126/71, pulse 73, temperature 98.6 F (37 C), temperature source Oral, resp. rate 17, height 5\' 11"  (1.803 m), weight 116.9 kg, SpO2 97 %. Physical Exam Vitals reviewed.  Constitutional:      Appearance: He is obese.  HENT:     Head: Normocephalic and atraumatic.     Right Ear: External ear normal.     Left Ear: External ear normal.     Nose: Nose normal.  Eyes:     General:        Right eye: No discharge.        Left eye: No discharge.     Extraocular Movements: Extraocular movements intact.  Cardiovascular:     Rate and Rhythm: Normal rate and regular rhythm.  Pulmonary:     Effort: Pulmonary effort is normal. No respiratory distress.     Breath sounds: No stridor.  Abdominal:     General: There is distension.     Comments: Hypoactive bowel sounds  Musculoskeletal:     Cervical back: Normal range of motion and neck supple.     Comments: Right stump with edema and tenderness  Skin:    Comments: Right AKA with VAC suctioning without leaks  Neurological:     Mental Status: He is alert.     Comments: Motor: Bilateral upper extremities, left lower extremity: 5/5 proximal distal Right lower extremity: Hip flexion: 3/5 (pain inhibition)  Psychiatric:        Mood and Affect: Mood normal.        Behavior: Behavior normal.        Thought Content: Thought content normal.  Results for orders placed or performed during the hospital encounter of 10/05/20 (from the past 48 hour(s))  Hemoglobin A1c     Status: Abnormal   Collection Time: 10/18/20  3:28 PM  Result Value Ref Range   Hgb A1c MFr Bld 5.9 (H) 4.8 - 5.6 %    Comment: (NOTE) Pre diabetes:          5.7%-6.4%  Diabetes:              >6.4%  Glycemic control for   <7.0% adults with diabetes    Mean Plasma Glucose 122.63 mg/dL    Comment: Performed at [facility], [place] [address], [place], [place] [address]  Creatinine, serum     Status: None   Collection Time: 10/19/20  4:54 AM  Result Value Ref Range   Creatinine, Ser 0.82 0.61 - 1.24 mg/dL   GFR, Estimated >60 >60 mL/min    Comment: (NOTE) Calculated using the CKD-EPI Creatinine Equation (2021) Performed at [facility] [address] [place], [place] [address]   CBC with Differential/Platelet     Status: Abnormal   Collection Time: 10/20/20  1:28 AM  Result Value Ref Range   WBC 7.6 4.0 - 10.5 K/uL   RBC 2.48 (L) 4.22 - 5.81 MIL/uL   Hemoglobin 7.0 (L) 13.0 - 17.0 g/dL   HCT 21.6 (L) 39.0 - 52.0 %   MCV 87.1 80.0 - 100.0 fL   MCH 28.2 26.0 - 34.0 pg   MCHC 32.4 30.0 - 36.0 g/dL   RDW 19.1 (H) 11.5 - 15.5 %   Platelets 505 (H) 150 - 400 K/uL   nRBC 0.0 0.0 - 0.2 %   Neutrophils Relative % 58 %   Neutro Abs 4.4 1.7 - 7.7 K/uL   Lymphocytes Relative 31 %   Lymphs Abs 2.4 0.7 - 4.0 K/uL   Monocytes Relative 6 %   Monocytes Absolute 0.5 0.1 - 1.0 K/uL   Eosinophils Relative 3 %   Eosinophils Absolute 0.2 0.0 - 0.5 K/uL   Basophils Relative 1 %   Basophils Absolute 0.1 0.0 - 0.1 K/uL   Immature Granulocytes 1 %   Abs Immature Granulocytes 0.04 0.00 - 0.07 K/uL    Comment: Performed at [facility] [address], [place], [place] [address]   No results found.     Medical Problem List and Plan: 1.  Deficits with mobility, transfers, self-care secondary to right AKA due to necrotizing fasciitis  -patient may not shower  -ELOS/Goals: 12 to 16 days/supervision/mod I  Admit to CIR 2.  Antithrombotics: -DVT/anticoagulation:  Pharmaceutical: Lovenox  -antiplatelet therapy: ASA 3. Pain Management: Oxycodone as needed  Monitor with increased exertion, particularly for neuropathic pain 4. Mood: Team to provide ego support.   -antipsychotic agents: N/A 5. Neuropsych: This patient is capable of making decisions on his own behalf. 6. Skin/Wound Care: Wound VAC to be d/c POD #7  (2/20).  --On vitamin C and Zinc to promote wound healing.  7. Fluids/Electrolytes/Nutrition: Monitor I/Os.  CMP ordered 8. HTN: Monitor BP tid and monitor for trends.   --Off losartan at this time.    Moderate increased mobility 9. Acute blood loss anemia:   Hemoglobin 7.0, CBC ordered  --received 2 units on 02/13  --Received Feraheme today--repeat in a week.  10. Recent Covid: Encourage pulmonary hygiene  --Continue Tessalon perles tid. 11. Pre-diabetes: Hgb A1C-5.9. Continue Carb modified diet.   Monitor increase mobility 12. Hyponatremia:  Continue salt tabs for now.   CMP ordered  Bary Leriche, PA-C 10/20/2020  I have personally performed a face to face diagnostic evaluation, including, but not limited to relevant history and physical exam findings, of this patient and developed relevant assessment and plan.  Additionally, I have reviewed and concur with the physician assistant's documentation above.  Delice Lesch, MD, ABPMR

## 2020-10-20 NOTE — Progress Notes (Signed)
Jamse Arn, MD  Physician  Physical Medicine and Rehabilitation  PMR Pre-admission    Signed  Date of Service:  10/20/2020 9:34 AM      Related encounter: ED to Hosp-Admission (Discharged) from 10/05/2020 in [place] all [x]Manual[x]Template[]Copied  Added by: [x]Patel, Domenick Bookbinder, MD[x]Warren, Earnest Conroy, PT   []Hover for details  PMR Admission Coordinator Pre-Admission Assessment  Patient: Andres [name] is an 65 y.o., male MRN: [id number] DOB: [birth year] Height: 5' 11" (180.3 cm) Weight: 116.9 kg  Insurance Information HMO:     PPO:      PCP:      IPA:      80/20:      OTHER:  PRIMARY: Worker's Comp      Subscriber:  CM Name: Wallene Huh      Phone#: [phone]     Fax#: [phone] Pre-Cert#:       Employer:  Benefits:  Phone #:      Name:  Irene Shipper. Date:      Deduct:       Out of Pocket Max:       Life Max:  CIR:       SNF:  Outpatient:      Co-Pay:  Home Health:       Co-Pay:  DME:      Co-Pay:  Providers:  SECONDARY: BCBS      Policy#: [id number]     Phone#:   Financial Counselor:       Phone#:   The "Data Collection Information Summary" for patients in Inpatient Rehabilitation Facilities with attached "Privacy Act [place] Records" was provided and verbally reviewed with: N/A  Emergency Contact Information         Contact Information    Name Relation Home Work Mobile   [place]   [phone]      Current Medical History  Patient Admitting Diagnosis: 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  Several days later presented to his doctors office and was prescribed antibiotics,  which did not appear to be effective as several days after that he was unable to walk.  Was sent to the ED at [facility] and then transferred to [facility], where he was admitted.  MRI showed diffuse subcutaneous soft tissue swelling and edema with no evidence of abscess.  Pt was treated with rocephin and daptomycin without improvement.  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 Sharol Given.  Pt completed rocephin and daptomyocin on 2/16.  Wound vac per orthopedics. Pt was incidentally found to be covid positive, and has completed course of IV remdesivir and off isolation.  Hospital course pain management and ABLA.  Received 2 units PRBC on 2/13.  Therapy evaluations were completed and pt was recommended for CIR for comprehensive rehab.   Patient's medical record from [facility] has been reviewed by the rehabilitation admission coordinator and physician.  Past Medical History  Past Medical History:  Diagnosis Date  . Basal cell carcinoma   . Hypertension     Family History   Family history is unknown by patient.  Prior Rehab/Hospitalizations Has the patient had prior rehab or hospitalizations prior to admission? No  Has the patient had major surgery during 100 days prior to admission? Yes             Current Medications  Current Facility-Administered Medications:  .  0.9 %  sodium chloride infusion, , Intravenous, Continuous, Newt Minion, MD, Last Rate: 10 mL/hr at 10/16/20 2105, New Bag at 10/16/20 2105 .  acetaminophen (TYLENOL) tablet 325-650 mg, 325-650 mg, Oral, Q6H PRN, Newt Minion, MD, 650 mg at 10/17/20 1240 .  ascorbic acid (VITAMIN C) tablet 500 mg, 500 mg, Oral, Daily, Newt Minion, MD, 500 mg at 10/20/20 0919 .  aspirin chewable tablet 81 mg, 81 mg, Oral, Daily, Newt Minion, MD, 81 mg at  10/20/20 0919 .  benzonatate (TESSALON) capsule 200 mg, 200 mg, Oral, TID, Newt Minion, MD, 200 mg at 10/20/20 0919 .  bisacodyl (DULCOLAX) suppository 10 mg, 10 mg, Rectal, Daily PRN, Newt Minion, MD .  docusate sodium (COLACE) capsule 100 mg, 100 mg, Oral, BID, Newt Minion, MD, 100 mg at 10/20/20 0919 .  enoxaparin (LOVENOX) injection 60 mg, 60 mg, Subcutaneous, Q24H, Newt Minion, MD, 60 mg at 10/19/20 2032 .  ferumoxytol (FERAHEME) 510 mg in sodium chloride 0.9 % 100 mL IVPB, 510 mg, Intravenous, Once, Barb Merino, MD, Last Rate: 468 mL/hr at 10/20/20 0923, 510 mg at 10/20/20 0923 .  gabapentin (NEURONTIN) capsule 200 mg, 200 mg, Oral, TID, Barb Merino, MD, 200 mg at 10/20/20 0919 .  guaiFENesin-dextromethorphan (ROBITUSSIN DM) 100-10 MG/5ML syrup 5 mL, 5 mL, Oral, Q6H PRN, Newt Minion, MD .  hydrALAZINE (APRESOLINE) injection 10 mg, 10 mg, Intravenous, Q4H PRN, Newt Minion, MD .  HYDROmorphone (DILAUDID) injection 1-2 mg, 1-2 mg, Intravenous, Q3H PRN, Charlynne Cousins, MD, 2 mg at 10/18/20 2017 .  magnesium citrate solution 1 Bottle, 1 Bottle, Oral, Once PRN, Newt Minion, MD .  MEDLINE mouth rinse, 15 mL, Mouth Rinse, BID, Newt Minion, MD, 15 mL at 10/20/20 0919 .  methocarbamol (ROBAXIN) tablet 500 mg, 500 mg, Oral, Q6H PRN, 500 mg at 10/18/20 2211 **OR** methocarbamol (ROBAXIN) 500 mg in dextrose 5 % 50 mL IVPB, 500 mg, Intravenous, Q6H PRN, Newt Minion, MD .  metoCLOPramide (REGLAN) tablet 5-10 mg, 5-10 mg, Oral, Q8H PRN **OR** metoCLOPramide (REGLAN) injection 5-10 mg, 5-10 mg, Intravenous, Q8H PRN, Newt Minion, MD .  ondansetron [facility]) tablet 4 mg, 4 mg, Oral, Q6H PRN, 4 mg at 10/17/20 0827 **OR** ondansetron (ZOFRAN) injection 4 mg, 4 mg, Intravenous, Q6H PRN, Newt Minion, MD .  oxyCODONE (Oxy IR/ROXICODONE) immediate release tablet 10 mg, 10 mg, Oral, Q4H PRN, Barb Merino, MD, 10 mg at 10/20/20 0919 .  polyethylene glycol (MIRALAX / GLYCOLAX)  packet 17 g, 17 g, Oral, Daily PRN, Newt Minion, MD, 17 g at 10/17/20 0826 .  sodium chloride tablet 1 g, 1 g, Oral, BID WC, Newt Minion, MD, 1 g at 10/20/20 0919 .  zinc sulfate capsule 220 mg, 220 mg, Oral, Daily, Newt Minion, MD, 220 mg at 10/20/20 7616  Patients Current Diet:     Diet Order  Diet - low sodium heart healthy            Diet Carb Modified Fluid consistency: Thin; Room service appropriate? Yes  Diet effective now                  Precautions / Restrictions Precautions Precautions: Fall Precaution Comments: wound vac Restrictions Weight Bearing Restrictions: Yes RLE Weight Bearing: Non weight bearing   Has the patient had 2 or more falls or a fall with injury in the past year? No  Prior Activity Level Community (5-7x/wk): fully independent, working FT as a Forensic scientist, driving, no DME used, walked on him treadmill 3-4x/week  Prior Functional Level Self Care: Did the patient need help bathing, dressing, using the toilet or eating? Independent  Indoor Mobility: Did the patient need assistance with walking from room to room (with or without device)? Independent  Stairs: Did the patient need assistance with internal or external stairs (with or without device)? Independent  Functional Cognition: Did the patient need help planning regular tasks such as shopping or remembering to take medications? [place] / [place] Devices/Equipment: Cane (specify quad or straight),Dentures (specify type),Eyeglasses,Hearing aid Home Equipment: Cane - single point  Prior Device Use: Indicate devices/aids used by the patient prior to current illness, exacerbation or injury? was using [facility] for RLE pain immediately prior to admission  Current Functional Level Cognition  Overall Cognitive Status: Within Functional Limits for tasks assessed Orientation Level: Oriented X4 General  Comments: Patient with improved pain tolerance this session. Continues to require increased time for mobility, but much improved from yesterday    Extremity Assessment (includes Sensation/Coordination)  Upper Extremity Assessment: Overall WFL for tasks assessed  Lower Extremity Assessment: RLE deficits/detail RLE: Unable to fully assess due to pain RLE Sensation: WNL RLE Coordination: WNL    ADLs  Overall ADL's : Needs assistance/impaired Eating/Feeding: Independent,Sitting Grooming: Wash/dry hands,Wash/dry face,Min guard,Sitting Upper Body Bathing: Min guard,Sitting Lower Body Bathing: Maximal assistance,Sitting/lateral leans Upper Body Dressing : Min guard,Sitting Lower Body Dressing: Maximal assistance,Sitting/lateral leans Toilet Transfer: Minimal assistance,+2 for safety/equipment Toilet Transfer Details (indicate cue type and reason): simulated to drop arm recliner, lateral scoot Toileting- Clothing Manipulation and Hygiene: Total assistance Functional mobility during ADLs: Minimal assistance,+2 for safety/equipment General ADL Comments: pt and wife educated on complensatory LB ADL technqiues    Mobility  Overal bed mobility: Needs Assistance Bed Mobility: Supine to Sit Supine to sit: Min assist,+2 for safety/equipment Sit to supine: Min guard General bed mobility comments: heavy use of rails, increased time    Transfers  Overall transfer level: Needs assistance Equipment used: None Transfers: Lateral/Scoot Transfers Sit to Stand: Mod assist,From elevated surface  Lateral/Scoot Transfers: Min assist,From elevated surface,+2 safety/equipment General transfer comment: increased time and effort with cues and supervision for safety. Requires encouragement, relaxation technique cues.    Ambulation / Gait / Stairs / Wheelchair Mobility  Ambulation/Gait General Gait Details: unable    Posture / Balance Dynamic Sitting Balance Sitting balance - Comments: able to  sit with supervision Balance Overall balance assessment: Needs assistance Sitting-balance support: Feet supported Sitting balance-Leahy Scale: Fair Sitting balance - Comments: able to sit with supervision Postural control: Left lateral lean Standing balance support: Bilateral upper extremity supported,During functional activity Standing balance-Leahy Scale: Poor Standing balance comment: reliant on UE support    Special needs/care consideration Wound Vac yes, Skin RLE amputation and Designated visitor spouse Andres [name] (from acute therapy documentation)  Living Arrangements: Spouse/significant other Available Help at Discharge: Family,Available 24 hours/day Type of Home: House Home Layout: One level Home Access: Stairs to enter Entrance Stairs-Rails: Surveyor, mining of Steps: [place]: No  Discharge Living Setting Plans for Discharge Living Setting: Patient's home Type of Home at Discharge: House Discharge Home Layout: Multi-level,Bed/bath upstairs,1/2 bath on main level Discharge Home Access: Stairs to enter (discussed need for ramp) Entrance Stairs-Number of Steps: 7-8 Discharge Bathroom Shower/Tub: Walk-in shower (upstairs) Discharge Bathroom Toilet: Handicapped height (handicapped upstairs, standard downstairs) Discharge Bathroom Accessibility:  (1/2 bath no, upstairs acc via RW) Does the patient have any problems obtaining your medications?: No  Social/Family/Support Systems Patient Roles: Spouse Anticipated Caregiver: Andres [name] (spouse) Anticipated Caregiver's Contact Information: [phone] Ability/Limitations of Caregiver: supervision Caregiver Availability: 24/7 Discharge Plan Discussed with Primary Caregiver: Yes Is Caregiver In Agreement with Plan?: Yes Does Caregiver/Family have Issues with Lodging/Transportation while Pt is in Rehab?: No  Goals Patient/Family Goal for Rehab: PT/OT  supervision to mod I Expected length of stay: 9-12 days Additional Information: worker's comp Pt/Family Agrees to Admission and willing to participate: Yes Program Orientation Provided & Reviewed with Pt/Caregiver Including Roles  & Responsibilities: Yes Additional Information Needs: worker's comp case manager Wallene Huh [phone]  Barriers to Discharge: Inaccessible home environment  Barriers to Discharge Comments: discussed need for ramp and pt/wife agreeable  Decrease burden of Care through IP rehab admission: n/a  Possible need for SNF placement upon discharge: No. Pt with excellent rehab potential and good support at home.   Patient Condition: I have reviewed medical records from [facility], spoken with CM, and patient and spouse. I met with patient at the bedside for inpatient rehabilitation assessment.  Patient will benefit from ongoing PT and OT, can actively participate in 3 hours of therapy a day 5 days of the week, and can make measurable gains during the admission.  Patient will also benefit from the coordinated team approach during an Inpatient Acute Rehabilitation admission.  The patient will receive intensive therapy as well as Rehabilitation physician, nursing, social worker, and care management interventions.  Due to safety, skin/wound care, medication administration, pain management and patient education the patient requires 24 hour a day rehabilitation nursing.  The patient is currently min with level lateral scoot transfers and basic ADLs.  Discharge setting and therapy post discharge at home with home health is anticipated.  Patient has agreed to participate in the Acute Inpatient Rehabilitation Program and will admit today.  Preadmission Screen Completed By:  Michel Santee, PT, DPT 10/20/2020 9:34 AM ______________________________________________________________________   Discussed status with Dr. Posey Pronto  on 10/20/20  at 11:35 AM  and received approval for admission  today.  Admission Coordinator:  Michel Santee, PT, DPT time 11:35 AM Sudie Grumbling 10/20/20    Assessment/Plan: Diagnosis: R AKA  1. Does the need for close, 24 hr/day Medical supervision in concert with the patient's rehab needs make it unreasonable for this patient to be served in a less intensive setting? Yes  2. Co-Morbidities requiring supervision/potential complications: HTN (monitor and provide prns in accordance with increased physical exertion and pain), post op pain (Biofeedback training with therapies to help reduce reliance on opiate pain medications, monitor pain control during therapies, and sedation at rest and titrate to maximum efficacy to ensure participation and gains in therapies), ABLA (repeat labs, consider transfusion if necessary to ensure appropriate perfusion for increased activity tolerance) 3. Due to bowel management, safety, skin/wound care, disease management, medication administration,  pain management and patient education, does the patient require 24 hr/day rehab nursing? Yes 4. Does the patient require coordinated care of a physician, rehab nurse, PT, OT to address physical and functional deficits in the context of the above medical diagnosis(es)? Yes Addressing deficits in the following areas: balance, endurance, locomotion, strength, transferring, bathing, dressing, toileting and psychosocial support 5. Can the patient actively participate in an intensive therapy program of at least 3 hrs of therapy 5 days a week? Yes 6. The potential for patient to make measurable gains while on inpatient rehab is excellent 7. Anticipated functional outcomes upon discharge from inpatient rehab: modified independent and supervision PT, modified independent and supervision OT, n/a SLP 8. Estimated rehab length of stay to reach the above functional goals is: 12-16 days. 9. Anticipated discharge destination: Home 10. Overall Rehab/Functional Prognosis: excellent and good   MD  Signature: Delice Lesch, MD, ABPMR          Revision History                                  Note Details  Author Jamse Arn, MD File Time 10/20/2020 11:43 AM  Author Type Physician Status Signed  Last Editor Jamse Arn, MD Service Physical Medicine and [place] # [id number] Admit Date 10/20/2020

## 2020-10-21 DIAGNOSIS — S78111A Complete traumatic amputation at level between right hip and knee, initial encounter: Secondary | ICD-10-CM

## 2020-10-21 LAB — COMPREHENSIVE METABOLIC PANEL
ALT: 24 U/L (ref 0–44)
AST: 18 U/L (ref 15–41)
Albumin: 2.2 g/dL — ABNORMAL LOW (ref 3.5–5.0)
Alkaline Phosphatase: 42 U/L (ref 38–126)
Anion gap: 8 (ref 5–15)
BUN: 9 mg/dL (ref 8–23)
CO2: 22 mmol/L (ref 22–32)
Calcium: 8.5 mg/dL — ABNORMAL LOW (ref 8.9–10.3)
Chloride: 106 mmol/L (ref 98–111)
Creatinine, Ser: 0.82 mg/dL (ref 0.61–1.24)
GFR, Estimated: >60 mL/min (ref >60)
Glucose, Bld: 108 mg/dL — ABNORMAL HIGH (ref 70–99)
Potassium: 4.1 mmol/L (ref 3.5–5.1)
Sodium: 136 mmol/L (ref 135–145)
Total Bilirubin: 0.6 mg/dL (ref 0.3–1.2)
Total Protein: 5.9 g/dL — ABNORMAL LOW (ref 6.5–8.1)

## 2020-10-21 LAB — CBC WITH DIFFERENTIAL/PLATELET
Abs Immature Granulocytes: 0.02 10*3/uL (ref 0.00–0.07)
Basophils Absolute: 0.1 10*3/uL (ref 0.0–0.1)
Basophils Relative: 1 %
Eosinophils Absolute: 0.2 10*3/uL (ref 0.0–0.5)
Eosinophils Relative: 4 %
HCT: 24.2 % — ABNORMAL LOW (ref 39.0–52.0)
Hemoglobin: 7.7 g/dL — ABNORMAL LOW (ref 13.0–17.0)
Immature Granulocytes: 0 %
Lymphocytes Relative: 26 %
Lymphs Abs: 1.4 10*3/uL (ref 0.7–4.0)
MCH: 27.5 pg (ref 26.0–34.0)
MCHC: 31.8 g/dL (ref 30.0–36.0)
MCV: 86.4 fL (ref 80.0–100.0)
Monocytes Absolute: 0.4 10*3/uL (ref 0.1–1.0)
Monocytes Relative: 7 %
Neutro Abs: 3.4 10*3/uL (ref 1.7–7.7)
Neutrophils Relative %: 62 %
Platelets: 531 10*3/uL — ABNORMAL HIGH (ref 150–400)
RBC: 2.8 MIL/uL — ABNORMAL LOW (ref 4.22–5.81)
RDW: 19.2 % — ABNORMAL HIGH (ref 11.5–15.5)
WBC: 5.5 10*3/uL (ref 4.0–10.5)
nRBC: 0 % (ref 0.0–0.2)

## 2020-10-21 MED ORDER — DOCUSATE SODIUM 100 MG PO CAPS
100.0000 mg | ORAL_CAPSULE | Freq: Three times a day (TID) | ORAL | Status: DC
  Administered 2020-10-21 – 2020-11-03 (×40): 100 mg via ORAL
  Filled 2020-10-21 (×40): qty 1

## 2020-10-21 MED ORDER — ENSURE ENLIVE PO LIQD
237.0000 mL | ORAL | Status: DC
  Administered 2020-10-22 – 2020-10-25 (×4): 237 mL via ORAL

## 2020-10-21 MED ORDER — JUVEN PO PACK
1.0000 | PACK | Freq: Two times a day (BID) | ORAL | Status: DC
  Administered 2020-10-22 – 2020-11-03 (×24): 1 via ORAL
  Filled 2020-10-21 (×26): qty 1

## 2020-10-21 MED ORDER — ENSURE MAX PROTEIN PO LIQD
11.0000 [oz_av] | Freq: Every day | ORAL | Status: DC
  Administered 2020-10-21 – 2020-10-26 (×6): 11 [oz_av] via ORAL

## 2020-10-21 MED ORDER — ACETAMINOPHEN 325 MG PO TABS
650.0000 mg | ORAL_TABLET | Freq: Three times a day (TID) | ORAL | Status: DC
  Administered 2020-10-21 – 2020-11-03 (×40): 650 mg via ORAL
  Filled 2020-10-21 (×41): qty 2

## 2020-10-21 NOTE — Evaluation (Signed)
Occupational Therapy Assessment and Plan  Patient Details  Name: Andres [name] MRN: [id number] Date of Birth: [birth year]  OT Diagnosis: acute pain and muscle weakness (generalized) Rehab Potential: Rehab Potential (ACUTE ONLY): Good ELOS: 12-14 days   Today's Date: 10/21/2020 OT Individual Time: 0800-0900 OT Individual Time Calculation (min): 60 min     Hospital Problem: Principal Problem:   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[place])   Past Medical History:  Past Medical History:  Diagnosis Date  . Basal cell carcinoma   . Hypertension    Past Surgical History:  Past Surgical History:  Procedure Laterality Date  . AMPUTATION Right 10/15/2020   Procedure: AMPUTATION ABOVE KNEE;  Surgeon: Newt Minion, MD;  Location: [place];  Service: Orthopedics;  Laterality: Right;  . FASCIOTOMY Right 10/11/2020   Procedure: RIGHT LEG FASCIOTOMIES AND DEBRIDE ULCER;  Surgeon: Newt Minion, MD;  Location: [place];  Service: Orthopedics;  Laterality: Right;  . I & D EXTREMITY Right 10/13/2020   Procedure: REPEAT DEBRIDEMENT RIGHT LEG;  Surgeon: Newt Minion, MD;  Location: [place];  Service: Orthopedics;  Laterality: Right;    Assessment & Plan Clinical Impression:Andres [name] is a 65 year old male with history of HTN, BCC who was admitted on 10/05/2020 with right lower extremity edema and erythema due to cellulitis. History taken from chart review and patient. He also found to be Covid positive but as asymptomatic was treated with remdesivir as well as Rocephin/daptomycin. MRI of the right lower extremity ordered due to progressive leukocytosis and showed mild ascites of anterior tibialis and soleus muscle. Dr. 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 Wound  culture positive for rare MRSA and BC with 1/2 positive for Corynebacterium felt to be contaminant. Has completed 2 week regimen of rocephin/daptomycin and gabapentin was added for management of neuropathy. Further complicated by postoperative pain and acute blood loss anemia requiring 2 units PRBC. Pre-renal azotemia resolved with IVF for hydration. Therapy ongoing and patient continues to require encouragement with increased time and effort for mobility, has poor safety awareness, limitations in self-care. As well as fear affecting mobility and ADLs. CIR recommended due to functional decline. Please see preadmission assessment from earlier today as well.     Patient transferred to CIR on 10/20/2020 .    Patient currently requires max with basic self-care skills secondary to muscle weakness, decreased cardiorespiratoy endurance and decreased sitting balance and decreased standing balance.  Prior to hospitalization, patient was fully independent, working full time.   Patient will benefit from skilled intervention to increase independence with basic self-care skills prior to discharge home with care partner.  Anticipate patient will require intermittent supervision and follow up home health.  OT - End of Session Endurance Deficit: Yes Endurance Deficit Description: limited by pain OT Assessment Rehab Potential (ACUTE ONLY): Good OT Patient demonstrates impairments in the following area(s): Balance;Endurance;Pain OT Basic ADL's Functional Problem(s): Bathing;Dressing;Toileting OT Transfers Functional Problem(s): Toilet OT Plan OT Intensity: Minimum of 1-2 x/day, 45 to 90 minutes OT Frequency: 5 out of 7 days OT Duration/Estimated Length of Stay: 12-14 days OT Treatment/Interventions: Balance/vestibular training;Discharge planning;Functional mobility training;DME/adaptive equipment instruction;Pain management;Patient/family education;Psychosocial support;Therapeutic Activities;Self Care/advanced ADL  retraining;Therapeutic Exercise;UE/LE Strength taining/ROM OT Self Feeding Anticipated Outcome(s): no goal, pt is independent OT Basic Self-Care  Anticipated Outcome(s): supervison with LB self care OT Toileting Anticipated Outcome(s): supervision OT Bathroom Transfers Anticipated Outcome(s): supervision OT Recommendation Patient destination: Home Follow Up Recommendations: Home health OT Equipment Recommended: 3 in 1 bedside comode;To be determined Equipment Details: to be determined further as depends on if stair lift would be installed prior to pt going home   OT Evaluation Precautions/Restrictions  Precautions Precautions: Fall Precaution Comments: wound vac on R   Pain Pain Assessment Pain Scale: 0-10 Pain Score: 8  Pain Type: Acute pain;Surgical pain;Neuropathic pain;Phantom pain Pain Location: Leg Pain Orientation: Right Pain Descriptors / Indicators: Aching Pain Onset: On-going Home Living/Prior [place] expects to be discharged to:: Private residence Living Arrangements: Spouse/significant other Available Help at Discharge: Family,Available 24 hours/day Type of Home: House Home Access: Stairs to enter [employer] of Steps: 4 Entrance Stairs-Rails: Right,Left Home Layout: Two level Alternate Level Stairs-Number of Steps: 12 Bathroom Shower/Tub: Gaffer (upstairs only)  Lives With: Spouse Prior Function Level of Independence: Independent with basic ADLs,Independent with homemaking with ambulation,Independent with gait,Independent with transfers  Able to Take Stairs?: Yes Driving: Yes Vocation: Full time employment Comments: just started using S[employer] with onset of RLE pain Vision Baseline Vision/History: Wears glasses Wears Glasses: At all times Patient Visual Report: No change from baseline Vision Assessment?: No apparent visual deficits Perception  Perception: Within Functional Limits Praxis Praxis:  Intact Cognition Overall Cognitive Status: Within Functional Limits for tasks assessed Arousal/Alertness: Awake/alert Orientation Level: Person;Place;Situation Person: Oriented Place: Oriented Situation: Oriented Year: 2022 Month: February Day of Week: Correct Memory: Appears intact Immediate Memory Recall: Sock;Blue;Bed Memory Recall Sock: Without Cue Memory Recall Blue: Without Cue Memory Recall Bed: Without Cue Awareness: Appears intact Safety/Judgment: Appears intact Sensation Sensation Light Touch: Appears Intact Hot/Cold: Appears Intact Proprioception: Appears Intact Stereognosis: Appears Intact Motor  Motor Motor: Within Functional Limits  Trunk/Postural Assessment  Thoracic Assessment Thoracic Assessment: Within Functional Limits Lumbar Assessment Lumbar Assessment: Within Functional Limits Postural Control Postural Control: Within Functional Limits  Balance Balance Balance Assessed:  (unable to assess dynamic and standing due to pain) Static Sitting Balance Static Sitting - Level of Assistance: 5: Stand by assistance Extremity/Trunk Assessment RUE Assessment RUE Assessment: Within Functional Limits LUE Assessment LUE Assessment: Within Functional Limits  Care Tool Care Tool Self Care Eating   Eating Assist Level: Independent    Oral Care    Oral Care Assist Level: Set up assist    Bathing   Body parts bathed by patient: Right upper leg;Left upper leg;Face;Front perineal area;Right arm;Left arm;Chest;Abdomen Body parts bathed by helper: Right lower leg;Buttocks Body parts n/a: Left lower leg Assist Level: Minimal Assistance - Patient > 75%    Upper Body Dressing(including orthotics)   What is the patient wearing?: Pull over shirt   Assist Level: Set up assist    Lower Body Dressing (excluding footwear)   What is the patient wearing?: Pants Assist for lower body dressing: Maximal Assistance - Patient 25 - 49% (bed level)    Putting on/Taking  off footwear   What is the patient wearing?: Non-skid slipper socks Assist for footwear: Total Assistance - Patient < 25%       Care Tool Toileting Toileting activity      (no void this session)   Care Tool Bed Mobility Roll left and right activity   Roll left and right assist level: Supervision/Verbal cueing    Sit to lying activity    (not evaluated this session due to pain)  Lying to sitting edge of bed activity         (not evaluated this session due to pain)          Care Tool Transfers Sit to stand transfer         (not evaluated this session due to pain)         Chair/bed transfer         (not evaluated this session due to pain)          Toilet transfer    (per PT, mod A to sq pivot to [facility])     Care Tool Cognition Expression of Ideas and Wants Expression of Ideas and Wants: Without difficulty (complex and basic) - expresses complex messages without difficulty and with speech that is clear and easy to understand   Understanding Verbal and Non-Verbal Content Understanding Verbal and Non-Verbal Content: Understands (complex and basic) - clear comprehension without cues or repetitions   Memory/Recall Ability *first 3 days only Memory/Recall Ability *first 3 days only: Location of own room;Current season;Staff names and faces;That he or she is in a hospital/hospital unit    Refer to Care Plan for [place] 1 OT Short Term Goal 1 (Week 1): Pt will complete BSC transfers with CGA or less. OT Short Term Goal 2 (Week 1): Pt will be able to doff pants over hips on BSC with min A. OT Short Term Goal 3 (Week 1): Pt will be able to pull pants over hips with min -mod A on BSC. OT Short Term Goal 4 (Week 1): Pt will be able to sit to stand with RW with mod A. OT Short Term Goal 5 (Week 1): Pt will be able to tolerate standing with RW for 1 minute.  Recommendations for other services: None    Skilled Therapeutic  Intervention ADL ADL Eating: Independent Grooming: Setup Upper Body Bathing: Setup Where Assessed-Upper Body Bathing: Bed level Lower Body Bathing: Minimal assistance Where Assessed-Lower Body Bathing: Bed level Upper Body Dressing: Setup Where Assessed-Upper Body Dressing: Bed level Lower Body Dressing: Maximal assistance Where Assessed-Lower Body Dressing: Bed level      Pt seen for OT initial evaluation and ADL training.  Discussed OT POC, pt's goals, ELOS.  Pt eager to get better to go home, but he was writhing in pain throughout the session (was provided with medication at start of session).  He did try rolling and scooting up in bed to complete B/d bed level, attempted to have pt sit to EOB but it was very painful for him to move his R leg even an inch. Suggested pt wait until next session once his pain meds had kicked in more.  Discussed with RN and MD pt's high pain levels. Pt resting in bed with all needs met, bed alarm set.      Discharge Criteria: Patient will be discharged from OT if patient refuses treatment 3 consecutive times without medical reason, if treatment goals not met, if there is a change in medical status, if patient makes no progress towards goals or if patient is discharged from hospital.  The above assessment, treatment plan, treatment alternatives and goals were discussed and mutually agreed upon: by patient  [facility] 10/21/2020, 12:34 PM

## 2020-10-21 NOTE — Plan of Care (Signed)
  Problem: RH SKIN INTEGRITY Goal: RH STG SKIN FREE OF INFECTION/BREAKDOWN Description: Skin remain free of infection and breakdown with min assist Outcome: Progressing; Negative pressure Problem: RH PAIN MANAGEMENT Goal: RH STG PAIN MANAGED AT OR BELOW PT'S PAIN GOAL Description: Pain less than 4 Outcome: Not Progressing; c/o pain 10/10; prn given; MD notified

## 2020-10-21 NOTE — Plan of Care (Signed)
  Problem: RH Balance Goal: LTG Patient will maintain dynamic sitting balance (PT) Description: LTG:  Patient will maintain dynamic sitting balance with assistance during mobility activities (PT) Outcome: Progressing Flowsheets (Taken 10/21/2020 1803) LTG: Pt will maintain dynamic sitting balance during mobility activities with:: Independent with assistive device  Goal: LTG Patient will maintain dynamic standing balance (PT) Description: LTG:  Patient will maintain dynamic standing balance with assistance during mobility activities (PT) Outcome: Progressing Flowsheets (Taken 10/21/2020 1803) LTG: Pt will maintain dynamic standing balance during mobility activities with:: Supervision/Verbal cueing   Problem: Sit to Stand Goal: LTG:  Patient will perform sit to stand with assistance level (PT) Description: LTG:  Patient will perform sit to stand with assistance level (PT) Outcome: Progressing Flowsheets (Taken 10/21/2020 1803) LTG: PT will perform sit to stand in preparation for functional mobility with assistance level: Supervision/Verbal cueing   Problem: RH Bed Mobility Goal: LTG Patient will perform bed mobility with assist (PT) Description: LTG: Patient will perform bed mobility with assistance, with/without cues (PT). Outcome: Progressing Flowsheets (Taken 10/21/2020 1803) LTG: Pt will perform bed mobility with assistance level of: Independent with assistive device    Problem: RH Bed to Chair Transfers Goal: LTG Patient will perform bed/chair transfers w/assist (PT) Description: LTG: Patient will perform bed to chair transfers with assistance (PT). Outcome: Progressing Flowsheets (Taken 10/21/2020 1803) LTG: Pt will perform Bed to Chair Transfers with assistance level: Supervision/Verbal cueing   Problem: RH Car Transfers Goal: LTG Patient will perform car transfers with assist (PT) Description: LTG: Patient will perform car transfers with assistance (PT). Outcome:  Progressing Flowsheets (Taken 10/21/2020 1803) LTG: Pt will perform car transfers with assist:: Supervision/Verbal cueing   Problem: RH Ambulation Goal: LTG Patient will ambulate in controlled environment (PT) Description: LTG: Patient will ambulate in a controlled environment, # of feet with assistance (PT). Outcome: Progressing Flowsheets (Taken 10/21/2020 1803) LTG: Pt will ambulate in controlled environ  assist needed:: Contact Guard/Touching assist LTG: Ambulation distance in controlled environment: at least 30 feet with LRAD Goal: LTG Patient will ambulate in home environment (PT) Description: LTG: Patient will ambulate in home environment, # of feet with assistance (PT). Outcome: Progressing Flowsheets (Taken 10/21/2020 1803) LTG: Pt will ambulate in home environ  assist needed:: Contact Guard/Touching assist LTG: Ambulation distance in home environment: at least 25 feet with LRAD   Problem: RH Wheelchair Mobility Goal: LTG Patient will propel w/c in controlled environment (PT) Description: LTG: Patient will propel wheelchair in controlled environment, # of feet with assist (PT) Outcome: Progressing Flowsheets (Taken 10/21/2020 1803) LTG: Pt will propel w/c in controlled environ  assist needed:: Set up assist LTG: Propel w/c distance in controlled environment: at least 150 feet Goal: LTG Patient will propel w/c in home environment (PT) Description: LTG: Patient will propel wheelchair in home environment, # of feet with assistance (PT). Outcome: Progressing Flowsheets (Taken 10/21/2020 1803) LTG: Pt will propel w/c in home environ  assist needed:: Set up assist LTG: Propel w/c distance in home environment: at least 50 feet

## 2020-10-21 NOTE — Evaluation (Signed)
Physical Therapy Assessment and Plan  Patient Details  Name: Andres [name] MRN: [id number] Date of Birth: 1956/05/31  PT Diagnosis: Abnormality of gait, Difficulty walking, Dizziness and giddiness, Muscle weakness and Pain at surgical site of above knee amputation Rehab Potential: Good ELOS: 14 days   Today's Date: 10/21/2020 PT Individual Time: 4098-1191 PT Individual Time Calculation (min): 76 min    Hospital Problem: Principal Problem:   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[place])   Past Medical History:  Past Medical History:  Diagnosis Date  . Basal cell carcinoma   . Hypertension    Past Surgical History:  Past Surgical History:  Procedure Laterality Date  . AMPUTATION Right 10/15/2020   Procedure: AMPUTATION ABOVE KNEE;  Surgeon: Andres Minion, MD;  Location: [facility];  Service: Orthopedics;  Laterality: Right;  . FASCIOTOMY Right 10/11/2020   Procedure: RIGHT LEG FASCIOTOMIES AND DEBRIDE ULCER;  Surgeon: Andres Minion, MD;  Location: [place];  Service: Orthopedics;  Laterality: Right;  . I & D EXTREMITY Right 10/13/2020   Procedure: REPEAT DEBRIDEMENT RIGHT LEG;  Surgeon: Andres Minion, MD;  Location: [facility];  Service: Orthopedics;  Laterality: Right;    Assessment & Plan Clinical Impression: Patient is a 65 y.o. male with history of HTN, BCC who was admitted on 10/05/2020 with right lower extremity edema and erythema due to cellulitis.  History taken from chart review and patient.  He also found to be Covid positive but as asymptomatic was treated with remdesivir as well as Rocephin/daptomycin.  MRI of the right lower extremity ordered due to progressive leukocytosis and showed mild ascites of anterior tibialis and soleus muscle. Dr. Sharol [name] consulted due to progressive leucocytosis and patient underwent 4 compartment right lower extremity fasciotomies with I & D on 10/11/2020 as well as repeat I & D on 10/13/2020 which revealed progressive muscle necrosis, therefore patient  underwent right AKA on 10/15/2020.  Wound culture positive for rare MRSA and BC with 1/2 positive for Corynebacterium felt to be contaminant. Has completed 2 week regimen of rocephin/daptomycin and gabapentin was added for management of neuropathy.  Further complicated by postoperative pain and acute blood loss anemia requiring 2 units PRBC. Pre-renal azotemia resolved with IVF for hydration. Therapy ongoing and patient continues to require encouragement with increased time and effort for mobility, has poor safety awareness, limitations in self-care.  As well as fear affecting mobility and ADLs.  Patient transferred to CIR on 10/20/2020 .   Patient currently requires mod A with mobility secondary to muscle weakness, decreased cardiorespiratoy endurance, impaired timing and sequencing and unbalanced muscle activation, , and decreased sitting balance, decreased standing balance, decreased balance strategies and difficulty maintaining precautions.  Prior to hospitalization, patient was independent  with mobility and lived with Spouse in a House home.  Home access is 7Stairs to enter.  Patient will benefit from skilled PT intervention to maximize safe functional mobility, minimize fall risk and decrease caregiver burden for planned discharge home with 24 hour supervision.  Anticipate patient will benefit from follow up [place] at discharge.  PT - End of Session Activity Tolerance: Tolerates 30+ min activity with multiple rests Endurance Deficit: Yes Endurance Deficit Description: limited by pain PT Assessment Rehab Potential (ACUTE/IP ONLY): Good PT Barriers to Discharge: [place] home environment;Home environment access/layout;Wound Care;Lack of/limited family support;Weight;Weight bearing restrictions PT Patient demonstrates impairments in the following area(s): Balance;Edema;Endurance;Motor;Pain;Perception;Safety;Sensory;Skin Integrity PT Transfers Functional Problem(s): Bed Mobility;Bed to  Chair;Car;Furniture PT Locomotion Functional Problem(s): Ambulation;Wheelchair Mobility;Stairs PT Plan PT  Intensity: Minimum of 1-2 x/day ,45 to 90 minutes PT Frequency: 5 out of 7 days PT Duration Estimated Length of Stay: 14 days PT Treatment/Interventions: Ambulation/gait training;Balance/vestibular training;Community reintegration;Discharge planning;DME/adaptive equipment instruction;Functional mobility training;Neuromuscular re-education;Pain management;Patient/family education;Psychosocial support;Skin care/wound management;Splinting/orthotics;Stair training;Therapeutic Activities;Therapeutic Exercise;UE/LE Strength taining/ROM;UE/LE Coordination activities;Wheelchair propulsion/positioning PT Transfers Anticipated Outcome(s): Supervision for squat pivot, stand pivot with RW PT Recommendation Recommendations for Other Services: Neuropsych consult;Therapeutic Recreation consult Therapeutic Recreation Interventions: Pet therapy;Stress management Follow Up Recommendations: Home health PT;24 hour supervision/assistance Patient destination: Home Equipment Recommended: To be determined;3 in 1 bedside comode;Rolling walker with 5" wheels Equipment Details: DME tbd based on pt's progress   PT Evaluation Precautions/Restrictions Precautions Precautions: Fall Precaution Comments: wound vac on R Restrictions Weight Bearing Restrictions: Yes RLE Weight Bearing: Non weight bearing General   Vital SignsTherapy Vitals Temp: 98.3 F (36.8 C) Temp Source: Oral Pulse Rate: 72 Resp: 18 BP: 133/76 Patient Position (if appropriate): Lying Oxygen Therapy SpO2: 100 % O2 Device: Room Air Pain Pain Assessment Pain Scale: 0-10 Pain Score: 8  Pain Type: Acute pain;Surgical pain;Neuropathic pain;Phantom pain Pain Location: Leg Pain Orientation: Right Pain Descriptors / Indicators: Aching;Constant;Grimacing Pain Onset: On-going Patients Stated Pain Goal: 3 Pain Intervention(s): Medication  (See eMAR) (Provided by RN during session) Multiple Pain Sites: No Home Living/Prior Functioning Home Living Available Help at Discharge: Family;Available 24 hours/day Type of Home: House Home Access: Stairs to enter [employer] of Steps: 7 Entrance Stairs-Rails: Right Home Layout: Two level Alternate Level Stairs-Number of Steps: 12 Bathroom Shower/Tub: Walk-in shower (upstairs only)  Lives With: Spouse Prior Function Level of Independence: Independent with basic ADLs;Independent with homemaking with ambulation;Independent with gait;Independent with transfers  Able to Take Stairs?: Yes Driving: Yes Vocation: Full time employment Comments: just started using SPC with onset of RLE pain Vision/Perception  Perception Perception: Within Functional Limits Praxis Praxis: Intact  Cognition Overall Cognitive Status: Within Functional Limits for tasks assessed Arousal/Alertness: Awake/alert Orientation Level: Oriented X4 Memory: Appears intact Awareness: Appears intact Problem Solving: Appears intact Sensation Sensation Light Touch: Appears Intact Coordination Gross Motor Movements are Fluid and Coordinated: Yes Motor  Motor Motor: Within Functional Limits Motor - Skilled Clinical Observations: pain limiting at time of eval   Trunk/Postural Assessment  Cervical Assessment Cervical Assessment: Exceptions to [facility] (forward head) Thoracic Assessment Thoracic Assessment: Exceptions to [facility] (minimally rounded shoulders) Lumbar Assessment Lumbar Assessment: Within Functional Limits Postural Control Postural Control: Within Functional Limits  Balance Balance Balance Assessed: Yes (unable to assess standing balance d/t high pain levels) Static Sitting Balance Static Sitting - Balance Support: No upper extremity supported;Feet unsupported Static Sitting - Level of Assistance: 5: Stand by assistance Dynamic Sitting Balance Dynamic Sitting - Balance Support: Bilateral  upper extremity supported;No upper extremity supported;During functional activity (L foot supported) Dynamic Sitting - Level of Assistance: 5: Stand by assistance;4: Min assist Dynamic Sitting - Balance Activities: Lateral lean/weight shifting;Forward lean/weight shifting;Reaching for objects;Reaching across midline Extremity Assessment      RLE Assessment RLE Assessment: Not tested (unable to assess d/t high pain level) LLE Assessment LLE Assessment: Exceptions to [facility] LLE Strength LLE Overall Strength: Within Functional Limits for tasks assessed;Deficits Left Hip Flexion: 4/5 Left Hip ABduction: 4+/5 Left Hip ADduction: 4/5 Left Knee Flexion: 3+/5 Left Knee Extension: 4/5 Left Ankle Dorsiflexion: 4/5 Left Ankle Plantar Flexion: 4-/5  Care Tool Care Tool Bed Mobility Roll left and right activity   Roll left and right assist level: Supervision/Verbal cueing    Sit to lying activity   Sit  to lying assist level: Minimal Assistance - Patient > 75%    Lying to sitting edge of bed activity   Lying to sitting edge of bed assist level: Minimal Assistance - Patient > 75%     Care Tool Transfers Sit to stand transfer Sit to stand activity did not occur: Safety/medical concerns      Chair/bed transfer   Chair/bed transfer assist level: Moderate Assistance - Patient 50 - 74%     Toilet transfer   Assist Level: Moderate Assistance - Patient 50 - 74%    Car transfer Car transfer activity did not occur: Safety/medical concerns        Care Tool Locomotion Ambulation Ambulation activity did not occur: Safety/medical concerns        Walk 10 feet activity Walk 10 feet activity did not occur: Safety/medical concerns       Walk 50 feet with 2 turns activity Walk 50 feet with 2 turns activity did not occur: Safety/medical concerns      Walk 150 feet activity Walk 150 feet activity did not occur: Safety/medical concerns      Walk 10 feet on uneven surfaces activity Walk 10 feet  on uneven surfaces activity did not occur: Safety/medical concerns      Stairs Stair activity did not occur: Safety/medical concerns        Walk up/down 1 step activity Walk up/down 1 step or curb (drop down) activity did not occur: Safety/medical concerns     Walk up/down 4 steps activity did not occuR: Safety/medical concerns  Walk up/down 4 steps activity      Walk up/down 12 steps activity Walk up/down 12 steps activity did not occur: Safety/medical concerns      Pick up small objects from floor Pick up small object from the floor (from standing position) activity did not occur: Safety/medical concerns      Wheelchair Will patient use wheelchair at discharge?: Yes Type of Wheelchair: Manual Wheelchair activity did not occur: Safety/medical concerns      Wheel 50 feet with 2 turns activity Wheelchair 50 feet with 2 turns activity did not occur: Safety/medical concerns    Wheel 150 feet activity Wheelchair 150 feet activity did not occur: Safety/medical concerns      Refer to Care Plan for Long Term Goals  SHORT TERM GOAL WEEK 1 PT Short Term Goal 1 (Week 1): Pt will perform supine <>sit with supervision. PT Short Term Goal 2 (Week 1): Pt will perform bed <> w/c squat pivot transfer with CGA. PT Short Term Goal 3 (Week 1): Pt will perform STS at [place] with CGA. PT Short Term Goal 4 (Week 1): Pt will ambulate at least 5 feet using LRAD and Min A. PT Short Term Goal 5 (Week 1): Pt will propel w/c at least 75 feet with supervision.  Recommendations for other services: Neuropsych and Therapeutic Recreation  Pet therapy and Stress management  Skilled Therapeutic Intervention Patient supine in bed upon PT arrival. Pt alert with pain apparent on his face and relates 8/ 10 pain. States he just received Tylenol from RN but cannot have pain medication again until 12:30pm. During session, RN returns and pt states pain remains at 8/10. RN provides tramadol at that time. Agreeable to PT  session within limits of pain.  Therapeutic Activity: Bed Mobility: Pt requires extra time to initiate due to pain and is only able to move in small increments. Requires vc for hand placements and technique. Is able to reach seated position  on EOB with CGA/ Min A using bed rail. At end of session, pt instructed in reaching sidelying position to L side prior to roll to supine. Requires Min A for LLE.  Transfers: Patient performed squat pivot transfer bed --> BSC with Min A and is able to reach [facility] in one effort. Transfer performed to pt's L side with LUE to far arm of BSC. Pt is surprised at how much he is able to perform with level of pain involved. Pt has not had BM since surgery one week ago. Attempts but is unable to move bowels at this time. Prior to return to bed, pt states pain level has reduced to 7/ 10. Return to bed with squat pivot to pt's R requires 2 efforts and Mod A to reach seated position on EOB.   Pt is able to demonstrate static and dynamic sitting balance tasks while seated on EOB including lateral leans, fwd/ bkwd leans, reaching just outside of BOS and across midline during functional activities. Pt is also able to weight shift and perform lateral flexion for lift of hip from seated surface bilaterally. In supine he is able to perform bridging with LLE and bring Bil hips off of bed surface for adjustment of bed linens. Pt informed that bridging will be a very important exercise to continue to perform on his own in bed. Education provided on positioning in bed and importance for hip extension mobility of R hip.   Appropriate sized w/c located and provided to pt with elevated leg rests.   Informed NT as to pt's transfer status at this time and his current limitations d/t pain level.     Patient supine in bed at end of session with brakes locked, bed alarm set, and all needs within reach with wife in room.     Mobility Bed Mobility Bed Mobility: Left Sidelying to Sit;Sit to  Sidelying Left;Scooting to [facility];Sitting - Scoot to Edge of Bed Left Sidelying to Sit: Contact Guard/Touching assist Sitting - Scoot to Edge of Bed: Contact Guard/Touching assist Sit to Sidelying Left: Minimal Assistance - Patient > 75% Scooting to HOB: Minimal Assistance - Patient > 75% Transfers Transfers: Pharmacist, hospital Pivot Transfers: Minimal Assistance - Patient > 75% Transfer (Assistive device): Other (Comment) (bedrail/ armrests) Locomotion  Gait Ambulation: No Gait Gait: No Stairs / Additional Locomotion Stairs: No Wheelchair Mobility Wheelchair Mobility: No   Discharge Criteria: Patient will be discharged from PT if patient refuses treatment 3 consecutive times without medical reason, if treatment goals not met, if there is a change in medical status, if patient makes no progress towards goals or if patient is discharged from hospital.  The above assessment, treatment plan, treatment alternatives and goals were discussed and mutually agreed upon: by patient and by family  Alger Simons 10/21/2020, 6:22 PM

## 2020-10-21 NOTE — Progress Notes (Signed)
PROGRESS NOTE   Subjective/Complaints: Andres [name] OT told me patient was writhing in pain during session. Continues to be in a lot of pain when I see him after session. Pain is sharp and in residual limb. Phantom limb is bearable to him. Discussed taking oxycodone 30 minutes prior to therapy in morning and he is agreeable.    Objective:   No results found. Recent Labs    10/20/20 0128 10/21/20 0715  WBC 7.6 5.5  HGB 7.0* 7.7*  HCT 21.6* 24.2*  PLT 505* 531*   Recent Labs    10/19/20 0454 10/21/20 0715  NA  --  136  K  --  4.1  CL  --  106  CO2  --  22  GLUCOSE  --  108*  BUN  --  9  CREATININE 0.82 0.82  CALCIUM  --  8.5*    Intake/Output Summary (Last 24 hours) at 10/21/2020 0924 Last data filed at 10/21/2020 0730 Gross per 24 hour  Intake 240 ml  Output 1950 ml  Net -1710 ml        Physical Exam: Vital [name] Blood pressure 119/75, pulse 76, temperature 98.9 F (37.2 C), temperature source Oral, resp. rate 17, height 5\' 11"  (1.803 m), SpO2 97 %. Gen: no distress, normal appearing HEENT: oral mucosa pink and moist, NCAT Cardio: Reg rate Chest: normal effort, normal rate of breathing Abd: soft, non-distended Ext: no edema Psych: pleasant, normal affect Skin: intact Musculoskeletal:     Cervical back: Normal range of motion and neck supple.     Comments: Right stump with edema and tenderness  Skin:    Comments: Right AKA with VAC suctioning without leaks  Neurological:     Mental Status: He is alert.     Comments: Motor: Bilateral upper extremities, left lower extremity: 5/5 proximal distal Right lower extremity: Hip flexion: 3/5 (pain inhibition)  Psychiatric:        Mood and Affect: Mood normal.        Behavior: Behavior normal.        Thought Content: Thought content normal.   Assessment/Plan: 1. Functional deficits which require 3+ hours per day of interdisciplinary therapy in a comprehensive  inpatient rehab setting.  Physiatrist is providing close team supervision and 24 hour management of active medical problems listed below.  Physiatrist and rehab team continue to assess barriers to discharge/monitor patient progress toward functional and medical goals  Care Tool:  Bathing    Body parts bathed by patient: Right upper leg,Left upper leg,Face,Front perineal area,Right arm,Left arm,Chest,Abdomen   Body parts bathed by helper: Right lower leg,Buttocks Body parts n/a: Left lower leg   Bathing assist Assist Level: Minimal Assistance - Patient > 75%     Upper Body Dressing/Undressing Upper body dressing   What is the patient wearing?: Pull over shirt    Upper body assist Assist Level: Set up assist    Lower Body Dressing/Undressing Lower body dressing      What is the patient wearing?: Pants     Lower body assist Assist for lower body dressing: Maximal Assistance - Patient 25 - 49% (bed level)     Toileting Toileting  Toileting assist       Transfers Chair/bed transfer  Transfers assist           Locomotion Ambulation   Ambulation assist              Walk 10 feet activity   Assist           Walk 50 feet activity   Assist           Walk 150 feet activity   Assist           Walk 10 feet on uneven surface  activity   Assist           Wheelchair     Assist               Wheelchair 50 feet with 2 turns activity    Assist            Wheelchair 150 feet activity     Assist          Blood pressure 119/75, pulse 76, temperature 98.9 F (37.2 C), temperature source Oral, resp. rate 17, height 5\' 11"  (1.803 m), SpO2 97 %.    Medical Problem List and Plan: 1.  Deficits with mobility, transfers, self-care secondary to right AKA due to necrotizing fasciitis             -patient may not shower             -ELOS/Goals: 12 to 16 days/supervision/mod I             Initial CIR  evaluations today.  2.  Antithrombotics: -DVT/anticoagulation:  Pharmaceutical: Continue Lovenox             -antiplatelet therapy: ASA 3. Pain Management: Oxycodone as needed. Added scheduled Tylenol q8H. Advised patient to take Oxycodone 30 minutes prior to AM therapy and he is agreeable.              Monitor with increased exertion, particularly for neuropathic pain 4. Mood: Team to provide ego support.              -antipsychotic agents: N/A 5. Neuropsych: This patient is capable of making decisions on his own behalf. 6. Skin/Wound Care: Wound VAC to be d/c POD #7 (2/20).             --On vitamin C and Zinc to promote wound healing.  7. Fluids/Electrolytes/Nutrition: Monitor I/Os.             CMP ordered 8. HTN: Monitor BP tid and monitor for trends.              --Off losartan at this time.               Moderate increased mobility 9. Acute blood loss anemia:              Hemoglobin 7.0, CBC ordered             --received 2 units on 02/13             --Received Feraheme   2/19: hgb reviewed and improved to 7.7, repeat Monday 10. Recent Covid: Encourage pulmonary hygiene             --Continue Tessalon perles tid. 11. Pre-diabetes: Hgb A1C-5.9. Continue Carb modified diet.              Monitor increase mobility 12. Hyponatremia: Continue salt tabs for now.  2/19: Na reviewed and is 136, d/c salt tabs and repeat Na Monday  LOS: 1 days A FACE TO FACE EVALUATION WAS PERFORMED  Martha Clan P [place] 10/21/2020, 9:24 AM

## 2020-10-21 NOTE — Progress Notes (Addendum)
Initial Nutrition Assessment  DOCUMENTATION CODES:   Not applicable  INTERVENTION:  Ensure Max po daily, each supplement provides 150 kcal and 30 grams protein  Ensure Enlive po daily, each supplement provides 350 kcal and 20 grams of protein  Juven BID, each packet provides 95 calories, 2.5 grams of protein (collagen), and 9.8 grams of carbohydrate (3 grams sugar); also contains 7 grams of L-arginine and L-glutamine, 300 mg vitamin C, 15 mg vitamin E, 1.2 mcg vitamin B-12, 9.5 mg zinc, 200 mg calcium, and 1.5 g  Calcium Beta-hydroxy-Beta-methylbutyrate to support wound healing  Obtain current wt to fully assess needs  Education provided   NUTRITION DIAGNOSIS:   Increased nutrient needs related to post-op healing (s/p R AKA seondary to LE necrotizing fascitis) as evidenced by estimated needs.  GOAL:   Patient will meet greater than or equal to 90% of their needs    MONITOR:   Labs,I & O's,Supplement acceptance,PO intake,Weight trends,Skin  REASON FOR ASSESSMENT:   Consult Diet education  ASSESSMENT:   65 year old male admitted to inpatient rehab following hospital admission for necrotizing fascitis of right lower extremity with myositis secondary to MRSA infection and incidental COVID-19 infection. 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   RD working remotely.  Spoke with pt via phone this afternoon, reports fairly good appetite during hosptial admission. Reports eating over half of dinner, recalls chicken pot pie and carrots. He reports having significant pain this morning, did eat as much at breakfast and unable to complete therapy, pain much improved this afternoon. RD educated on increased nutrition needs to promote wound healing and the importance of adequate nutrition. He is agreeable to Ensure Max to aid with meeting needs as well as Juven to support post-op healing.   Meal intake 75-85% x 2  documented meals  Last BM on 2/15, laxative; stool softener taken today. Pt reports no bowel movement in a week. RD discussed adequate hydration, pt says he is drinking "tons" of water. RD suggested trying warm beverage to stimulate bowels as well as activity as able.   No new weights for review. Last wt 116.9 kg on 10/05/20 prior to right AKA. Appears pt is chronically obese per history. He would benefit from wt loss, however he has increased needs s/p POD 7 right AKA, discussion today focused on optimizing nutrition to support wound healing.  Medications reviewed and include: Vit C 500 mg daily, Colace, Gabapentin, Zinc sulfate  Labs reviewed   NUTRITION - FOCUSED PHYSICAL EXAM: Unable to complete at this time   Diet Order:   Diet Order            Diet Carb Modified Fluid consistency: Thin; Room service appropriate? Yes  Diet effective now                 EDUCATION NEEDS:   Education needs have been addressed  Skin:  Skin Assessment: Skin Integrity Issues: Skin Integrity Issues:: Incisions Incisions: closed; right leg;right thigh  Last BM:  2/15  Height:   Ht Readings from Last 1 Encounters:  10/20/20 5\' 11"  (1.803 m)    Weight:   Wt Readings from Last 1 Encounters:  10/05/20 116.9 kg    BMI:  Body mass index is 35.94 kg/m.  Estimated Nutritional Needs:   Kcal:  5852-7782  Protein:  140-160  Fluid:  >/= 2.5 L   Lajuan Lines, RD, LDN Clinical Nutrition After Hours/Weekend Pager # in [place]

## 2020-10-21 NOTE — Progress Notes (Signed)
Occupational Therapy Session Note  Patient Details  Name: Andres [name] MRN: [id number] Date of Birth: February 04, 1956  Today's Date: 10/21/2020 OT Individual Time: 2353-6144 OT Individual Time Calculation (min): 43 min    Short Term Goals: Week 1:  OT Short Term Goal 1 (Week 1): Pt will complete BSC transfers with CGA or less. OT Short Term Goal 2 (Week 1): Pt will be able to doff pants over hips on BSC with min A. OT Short Term Goal 3 (Week 1): Pt will be able to pull pants over hips with min -mod A on BSC. OT Short Term Goal 4 (Week 1): Pt will be able to sit to stand with RW with mod A. OT Short Term Goal 5 (Week 1): Pt will be able to tolerate standing with RW for 1 minute.  Skilled Therapeutic Interventions/Progress Updates:  Treatment session with focus on functional transfers and care of residual limb.  Pt received upright in bed finishing lunch. Pt reports pain 8/10 at rest and increases with activity.  Therapist educated on pain management and management of phantom pain.  Pt would benefit from continued therapeutic activities to address each and to begin focus on desensitization after wound VAC removal.  Therapist provided pt and wife with handouts for desensitization and pain management.  Pt agreeable to transfer training this session.  Pt completed bed mobility with CGA to min assist with use of hospital bed features.  Pt completed lateral scoot transfer bed <> w/c with min-mod assist and mod cues for hand placement and sequencing.  Pt will benefit from additional practice as pain subsides to increase sequencing of transfers.  Discussed Amputee Support Group options and recommendation for peer support as pt emotional and wife reporting peer support would be appreciated.  Pt returned to bed due to increased pain upright, but tolerated sitting in w/c ~10 mins.  Pt remained semi-reclined with all needs in reach.  Wife present and supportive throughout session.  Therapy  Documentation Precautions:  Precautions Precautions: Fall Precaution Comments: wound vac on R Restrictions Weight Bearing Restrictions: Yes RLE Weight Bearing: Non weight bearing General:   Vital Signs: Therapy Vitals Temp: 98.3 F (36.8 C) Temp Source: Oral Pulse Rate: 72 Resp: 18 BP: 133/76 Patient Position (if appropriate): Lying Oxygen Therapy SpO2: 100 % O2 Device: Room Air Pain: Pain Assessment Pain Score: 8 Premedicated.  Repositioned at end of session.   Therapy/Group: Individual Therapy  [name] 10/21/2020, 3:21 PM

## 2020-10-22 MED ORDER — SENNA 8.6 MG PO TABS
2.0000 | ORAL_TABLET | Freq: Every day | ORAL | Status: DC
  Administered 2020-10-22 – 2020-10-26 (×5): 17.2 mg via ORAL
  Filled 2020-10-22 (×5): qty 2

## 2020-10-22 NOTE — Progress Notes (Signed)
LBM 2/15. Laxatives given. MD aware and adjusted BM meds. . Patient claims he has a lot of gas. Offered dulcolax suppository patient refused at this time.

## 2020-10-22 NOTE — Progress Notes (Signed)
PROGRESS NOTE   Subjective/Complaints: Pain is much better controlled with scheduled tylenol He is going to take AM oxycodone now before therapy Still has not had BM in a while though feels comfortable. Discussed adding 2 tabs Senna HS and he is agreeable.  ROS: +constipation  Objective:   No results found. Recent Labs    10/20/20 0128 10/21/20 0715  WBC 7.6 5.5  HGB 7.0* 7.7*  HCT 21.6* 24.2*  PLT 505* 531*   Recent Labs    10/21/20 0715  NA 136  K 4.1  CL 106  CO2 22  GLUCOSE 108*  BUN 9  CREATININE 0.82  CALCIUM 8.5*    Intake/Output Summary (Last 24 hours) at 10/22/2020 0956 Last data filed at 10/22/2020 0735 Gross per 24 hour  Intake 438 ml  Output 1950 ml  Net -1512 ml        Physical Exam: Vital Signs Blood pressure 116/74, pulse 74, temperature 99 F (37.2 C), temperature source Oral, resp. rate 18, height 5\' 11"  (1.803 m), SpO2 96 %. Gen: no distress, normal appearing HEENT: oral mucosa pink and moist, NCAT Cardio: Reg rate Chest: normal effort, normal rate of breathing Abd: soft, non-distended Ext: no edema Psych: pleasant, normal affect Musculoskeletal:     Cervical back: Normal range of motion and neck supple.     Comments: Right stump with edema and tenderness  Skin:    Comments: Right AKA with VAC suctioning without leaks  Neurological:     Mental Status: He is alert.     Comments: Motor: Bilateral upper extremities, left lower extremity: 5/5 proximal distal Right lower extremity: Hip flexion: 3/5 (pain inhibition)  Psychiatric:        Mood and Affect: Mood normal.        Behavior: Behavior normal.        Thought Content: Thought content normal.   Assessment/Plan: 1. Functional deficits which require 3+ hours per day of interdisciplinary therapy in a comprehensive inpatient rehab setting.  Physiatrist is providing close team supervision and 24 hour management of active  medical problems listed below.  Physiatrist and rehab team continue to assess barriers to discharge/monitor patient progress toward functional and medical goals  Care Tool:  Bathing    Body parts bathed by patient: Right upper leg,Left upper leg,Face,Front perineal area,Right arm,Left arm,Chest,Abdomen   Body parts bathed by helper: Right lower leg,Buttocks Body parts n/a: Left lower leg   Bathing assist Assist Level: Minimal Assistance - Patient > 75%     Upper Body Dressing/Undressing Upper body dressing   What is the patient wearing?: Hospital gown only    Upper body assist Assist Level: Set up assist    Lower Body Dressing/Undressing Lower body dressing      What is the patient wearing?: Incontinence brief     Lower body assist Assist for lower body dressing: Maximal Assistance - Patient 25 - 49% (bed level)     Toileting Toileting    Toileting assist       Transfers Chair/bed transfer  Transfers assist     Chair/bed transfer assist level: Moderate Assistance - Patient 50 - 74%     Locomotion Ambulation  Ambulation assist   Ambulation activity did not occur: Safety/medical concerns          Walk 10 feet activity   Assist  Walk 10 feet activity did not occur: Safety/medical concerns        Walk 50 feet activity   Assist Walk 50 feet with 2 turns activity did not occur: Safety/medical concerns         Walk 150 feet activity   Assist Walk 150 feet activity did not occur: Safety/medical concerns         Walk 10 feet on uneven surface  activity   Assist Walk 10 feet on uneven surfaces activity did not occur: Safety/medical concerns         Wheelchair     Assist Will patient use wheelchair at discharge?: Yes Type of Wheelchair: Manual Wheelchair activity did not occur: Safety/medical concerns         Wheelchair 50 feet with 2 turns activity    Assist    Wheelchair 50 feet with 2 turns activity did not  occur: Safety/medical concerns       Wheelchair 150 feet activity     Assist  Wheelchair 150 feet activity did not occur: Safety/medical concerns       Blood pressure 116/74, pulse 74, temperature 99 F (37.2 C), temperature source Oral, resp. rate 18, height 5\' 11"  (1.803 m), SpO2 96 %.    Medical Problem List and Plan: 1.  Deficits with mobility, transfers, self-care secondary to right AKA due to necrotizing fasciitis             -patient may not shower             -ELOS/Goals: 12 to 16 days/supervision/mod I            Continue CIR 2.  Antithrombotics: -DVT/anticoagulation:  Pharmaceutical: Continue Lovenox             -antiplatelet therapy: ASA 3. Pain Management: Oxycodone as needed. Added scheduled Tylenol q8H. Advised patient to take Oxycodone 30 minutes prior to AM therapy and he is agreeable. This regimen has improved pain, continue             Monitor with increased exertion, particularly for neuropathic pain 4. Mood: Team to provide ego support.              -antipsychotic agents: N/A 5. Neuropsych: This patient is capable of making decisions on his own behalf. 6. Skin/Wound Care: Wound VAC to be d/c POD #7 (2/20).             --On vitamin C and Zinc to promote wound healing.  7. Fluids/Electrolytes/Nutrition: Monitor I/Os.             CMP ordered 8. HTN: Monitor BP tid and monitor for trends.              --Off losartan at this time.               Moderate increased mobility 9. Acute blood loss anemia:              Hemoglobin 7.0, CBC ordered             --received 2 units on 02/13             --Received Feraheme   2/19: hgb reviewed and improved to 7.7, repeat Monday 10. Recent Covid: Encourage pulmonary hygiene             --Continue Tessalon perles  tid. 11. Pre-diabetes: Hgb A1C-5.9. Continue Carb modified diet.              Monitor increase mobility 12. Hyponatremia: Continue salt tabs for now.              2/19: Na reviewed and is 136, d/c salt tabs  and repeat Na Monday 13. Constipation: Colace increased to TID- no BM still. 2 senna tabs schedule for tonight. Patient is comfortable but it has been a while since he had a BM  LOS: 2 days A FACE TO FACE EVALUATION WAS PERFORMED  Clide Deutscher Raulkar 10/22/2020, 9:56 AM

## 2020-10-23 LAB — BASIC METABOLIC PANEL
Anion gap: 9 (ref 5–15)
BUN: 14 mg/dL (ref 8–23)
CO2: 23 mmol/L (ref 22–32)
Calcium: 8.6 mg/dL — ABNORMAL LOW (ref 8.9–10.3)
Chloride: 103 mmol/L (ref 98–111)
Creatinine, Ser: 0.96 mg/dL (ref 0.61–1.24)
GFR, Estimated: >60 mL/min (ref >60)
Glucose, Bld: 97 mg/dL (ref 70–99)
Potassium: 3.7 mmol/L (ref 3.5–5.1)
Sodium: 135 mmol/L (ref 135–145)

## 2020-10-23 LAB — CBC
HCT: 23.5 % — ABNORMAL LOW (ref 39.0–52.0)
Hemoglobin: 7.9 g/dL — ABNORMAL LOW (ref 13.0–17.0)
MCH: 29.2 pg (ref 26.0–34.0)
MCHC: 33.6 g/dL (ref 30.0–36.0)
MCV: 86.7 fL (ref 80.0–100.0)
Platelets: 477 10*3/uL — ABNORMAL HIGH (ref 150–400)
RBC: 2.71 MIL/uL — ABNORMAL LOW (ref 4.22–5.81)
RDW: 19.3 % — ABNORMAL HIGH (ref 11.5–15.5)
WBC: 5 10*3/uL (ref 4.0–10.5)
nRBC: 0 % (ref 0.0–0.2)

## 2020-10-23 LAB — OCCULT BLOOD X 1 CARD TO LAB, STOOL: Fecal Occult Bld: POSITIVE — AB

## 2020-10-23 MED ORDER — ACETAMINOPHEN 325 MG PO TABS: 650.0000 mg | ORAL_TABLET | Freq: Three times a day (TID) | ORAL | Status: DC

## 2020-10-23 MED ORDER — SORBITOL 70 % SOLN
30.0000 mL | Freq: Once | Status: AC
  Administered 2020-10-23: 30 mL via ORAL
  Filled 2020-10-23: qty 30

## 2020-10-23 NOTE — Progress Notes (Signed)
Inpatient Rehabilitation  Patient information reviewed and entered into eRehab system by Cha Gomillion M. Itzamara Casas, M.A., CCC/SLP, PPS Coordinator.  Information including medical coding, functional ability and quality indicators will be reviewed and updated through discharge.    

## 2020-10-23 NOTE — Progress Notes (Signed)
PROGRESS NOTE   Subjective/Complaints:  Pt reports when he and nursing stay on top f pain meds, it's controlled, but have to stay on top of meds.   No Bm in 1 week- will give Sorbitol today and if no results, might need Mg citrate- Denies abd pain and swelling.    ROS:  Pt denies SOB, abd pain, CP, N/V, and vision changes   Objective:   No results found. Recent Labs    10/21/20 0715 10/23/20 0557  WBC 5.5 5.0  HGB 7.7* 7.9*  HCT 24.2* 23.5*  PLT 531* 477*   Recent Labs    10/21/20 0715 10/23/20 0557  NA 136 135  K 4.1 3.7  CL 106 103  CO2 22 23  GLUCOSE 108* 97  BUN 9 14  CREATININE 0.82 0.96  CALCIUM 8.5* 8.6*    Intake/Output Summary (Last 24 hours) at 10/23/2020 2005 Last data filed at 10/23/2020 1842 Gross per 24 hour  Intake 700 ml  Output 2700 ml  Net -2000 ml        Physical Exam: Vital Signs Blood pressure 130/85, pulse 76, temperature 98 F (36.7 C), temperature source Oral, resp. rate 18, height 5\' 11"  (1.803 m), SpO2 (!) 84 %. Gen: sitting up in bed, finished with breakfast, NAD HEENT: conjugate gazeT Cardio: RRR- no JVD Chest: CTA B/L- no W/R/R- good air movement DGL:OVFIEPP distended to me (he says it's not); NT, hypoactive BS- no tinkling Ext: no edema Psych: appropriate Musculoskeletal:     Cervical back: Normal range of motion and neck supple.     Comments: Right stump with edema and tenderness  Skin:    Comments: Right AKA with VAC suctioning without leaks  Neurological:     Mental Status: He is alert.     Comments: Motor: Bilateral upper extremities, left lower extremity: 5/5 proximal distal Right lower extremity: Hip flexion: 3/5 (pain inhibition)     Assessment/Plan: 1. Functional deficits which require 3+ hours per day of interdisciplinary therapy in a comprehensive inpatient rehab setting.  Physiatrist is providing close team supervision and 24 hour management of  active medical problems listed below.  Physiatrist and rehab team continue to assess barriers to discharge/monitor patient progress toward functional and medical goals  Care Tool:  Bathing    Body parts bathed by patient: Right upper leg,Left upper leg,Face,Front perineal area,Right arm,Left arm,Chest,Abdomen   Body parts bathed by helper: Right lower leg,Buttocks Body parts n/a: Left lower leg   Bathing assist Assist Level: Minimal Assistance - Patient > 75%     Upper Body Dressing/Undressing Upper body dressing   What is the patient wearing?: Pull over shirt    Upper body assist Assist Level: Independent    Lower Body Dressing/Undressing Lower body dressing      What is the patient wearing?: Underwear/pull up     Lower body assist Assist for lower body dressing: Independent     Toileting Toileting    Toileting assist Assist for toileting: Minimal Assistance - Patient > 75%     Transfers Chair/bed transfer  Transfers assist     Chair/bed transfer assist level: Minimal Assistance - Patient > 75%  Locomotion Ambulation   Ambulation assist   Ambulation activity did not occur: Safety/medical concerns          Walk 10 feet activity   Assist  Walk 10 feet activity did not occur: Safety/medical concerns        Walk 50 feet activity   Assist Walk 50 feet with 2 turns activity did not occur: Safety/medical concerns         Walk 150 feet activity   Assist Walk 150 feet activity did not occur: Safety/medical concerns         Walk 10 feet on uneven surface  activity   Assist Walk 10 feet on uneven surfaces activity did not occur: Safety/medical concerns         Wheelchair     Assist Will patient use wheelchair at discharge?: Yes Type of Wheelchair: Manual Wheelchair activity did not occur: Safety/medical concerns  Wheelchair assist level: Independent Max wheelchair distance: 150    Wheelchair 50 feet with 2 turns  activity    Assist    Wheelchair 50 feet with 2 turns activity did not occur: Safety/medical concerns   Assist Level: Independent   Wheelchair 150 feet activity     Assist  Wheelchair 150 feet activity did not occur: Safety/medical concerns   Assist Level: Independent   Blood pressure 130/85, pulse 76, temperature 98 F (36.7 C), temperature source Oral, resp. rate 18, height 5\' 11"  (1.803 m), SpO2 (!) 84 %.    Medical Problem List and Plan: 1.  Deficits with mobility, transfers, self-care secondary to right AKA due to necrotizing fasciitis             -patient may not shower             -ELOS/Goals: 12 to 16 days/supervision/mod I            Continue CIR 2.  Antithrombotics: -DVT/anticoagulation:  Pharmaceutical: Continue Lovenox             -antiplatelet therapy: ASA 3. Pain Management: Oxycodone as needed. Added scheduled Tylenol q8H. Advised patient to take Oxycodone 30 minutes prior to AM therapy and he is agreeable. This regimen has improved pain, continue  2/21- pain better controlled- con't regimen             Monitor with increased exertion, particularly for neuropathic pain 4. Mood: Team to provide ego support.              -antipsychotic agents: N/A 5. Neuropsych: This patient is capable of making decisions on his own behalf. 6. Skin/Wound Care: Wound VAC to be d/c POD #7 (2/20).             --On vitamin C and Zinc to promote wound healing.  2/21- VAC should be off- didn't see it, but will double check in AM  7. Fluids/Electrolytes/Nutrition: Monitor I/Os.             CMP ordered 8. HTN: Monitor BP tid and monitor for trends.              --Off losartan at this time.               Moderate increased mobility 9. Acute blood loss anemia:              Hemoglobin 7.0, CBC ordered             --received 2 units on 02/13             --Received  Feraheme   2/19: hgb reviewed and improved to 7.7, repeat Monday 10. Recent Covid: Encourage pulmonary hygiene              --Continue Tessalon perles tid. 11. Pre-diabetes: Hgb A1C-5.9. Continue Carb modified diet.              Monitor increase mobility 12. Hyponatremia: Continue salt tabs for now.              2/19: Na reviewed and is 136, d/c salt tabs and repeat Na Monday 13. Constipation: Colace increased to TID- no BM still. 2 senna tabs schedule for tonight. Patient is comfortable but it has been a while since he had a BM  2/21- will give Sorbitol x1 dose today- if not effective, needs Mg Citrate.   LOS: 3 days A FACE TO FACE EVALUATION WAS PERFORMED  Rosabell Geyer 10/23/2020, 8:05 PM

## 2020-10-23 NOTE — Progress Notes (Signed)
Inpatient Rehabilitation Care Coordinator Assessment and Plan Patient Details  Name: Andres [name] MRN: [id number] Date of Birth: [birth year]  Today's Date: 10/23/2020  Hospital Problems: Principal Problem:   Above knee amputation of right lower extremity [facility])  Past Medical History:  Past Medical History:  Diagnosis Date  . Basal cell carcinoma   . Hypertension    Past Surgical History:  Past Surgical History:  Procedure Laterality Date  . AMPUTATION Right 10/15/2020   Procedure: AMPUTATION ABOVE KNEE;  Surgeon: Newt Minion, MD;  Location: [place];  Service: Orthopedics;  Laterality: Right;  . FASCIOTOMY Right 10/11/2020   Procedure: RIGHT LEG FASCIOTOMIES AND DEBRIDE ULCER;  Surgeon: Newt Minion, MD;  Location: [place];  Service: Orthopedics;  Laterality: Right;  . I & D EXTREMITY Right 10/13/2020   Procedure: REPEAT DEBRIDEMENT RIGHT LEG;  Surgeon: Newt Minion, MD;  Location: [place];  Service: Orthopedics;  Laterality: Right;   Social History:  reports that he has quit smoking. He has never used smokeless tobacco. He reports previous alcohol use. He reports that he does not use drugs.  Family / Support Systems Marital Status: Married Patient Roles: Spouse,Other (Comment) (employee) Spouse/Significant Other: Andres [name] wife [phone]-cell Other Supports: Friends Anticipated Caregiver: Andres [name] Ability/Limitations of Caregiver: Supervision-some min Caregiver Availability: 24/7 Family Dynamics: Close with family and friends, still processing everything that has happened in the past two weeks. 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 Wife is involved and will be here daily   Abuse/Neglect Abuse/Neglect Assessment Can Be Completed: Yes Physical Abuse: Denies Verbal Abuse: Denies Sexual Abuse: Denies Exploitation of patient/patient's resources: Denies Self-Neglect: Denies  Emotional Status Pt's affect, behavior and adjustment status: Pt is motivated to do well and get as independent as can be before leaving rehab. Pt is still processing losing his leg and how he will manage with this. He has always been independent and wants to be again Recent Psychosocial Issues: other health issues Psychiatric History: No history deferred depression screening still processing loss of leg. Will make neuro-psych referral to see to assist with coping. Substance Abuse History: No issues  Patient / Family Perceptions, Expectations & Goals Pt/Family understanding of illness & functional limitations: Pt and wife have a good understanding of his surgeries and his treatment plan moving forward. He is very involved in his plans and has goals of gettng independent prior to DC home. Premorbid pt/family roles/activities: Husband, employee, friend, son, etc Anticipated changes in roles/activities/participation: resume Pt/family expectations/goals: Pt states: " I want to get up the seven steps into my home and not need a ramp." Wife states: " I will help but know how independent he is and wants to get back too."  [employer]: Other (Comment) (Workers Comp-Melissa) Premorbid Home Care/DME Agencies: Other (Comment) (has a cane) Transportation available at discharge: Wife pt drove PTA Resource referrals recommended: Neuropsychology  Discharge Planning Living Arrangements: Spouse/significant other Support Systems: Spouse/significant other,Friends/neighbors Type of Residence: Private  residence Insurance Resources: Multimedia programmer (specify),Insurance Case Manager (specify name) (Work[employer]-Melissa [phone]) Financial Resources: [place] Referred: No Living Expenses:  Own Money Management: Patient,Spouse Does the patient have any problems obtaining your medications?: No Home Management: Wife Patient/Family Preliminary Plans: Return home with wife who is retired, she can assist him. They live in a multi-level home and have 7 steps to enter. He hopes to not need a ramp since it woule be a large one with seven steps. He is willing to do what he can to be as independent as possible before discharge. Will work with Workers [employer]-CM to obtain services-DME he will need at discharge. Care Coordinator Barriers to Discharge: Home environment access/layout Care Coordinator Barriers to Discharge Comments: 7 steps to get into home Care Coordinator Anticipated Follow Up Needs: HH/OP  Clinical Impression Pleasant gentleman who is coping appropriately with the loss of his leg and the suddenness of it. He would benefit from neuro-psych seeing and has strong supports via wife and friends. Will work with Workers Comp to make sure has equipment and services he will need at discharge.   [name] 10/23/2020, 9:44 AM

## 2020-10-23 NOTE — Progress Notes (Signed)
[facility] Individual Statement of Services  Patient Name:  Andres [name]  Date:  10/23/2020  Welcome to the [facility].  Our goal is to provide you with an individualized program based on your diagnosis and situation, designed to meet your specific needs.  With this comprehensive rehabilitation program, you will be expected to participate in at least 3 hours of rehabilitation therapies Monday-Friday, with modified therapy programming on the weekends.  Your rehabilitation program will include the following services:  Physical Therapy (PT), Occupational Therapy (OT), 24 hour per day rehabilitation nursing, Neuropsychology, Care Coordinator, Rehabilitation Medicine, Nutrition Services and Pharmacy Services  Weekly team conferences will be held on Wednesday to discuss your progress.  Your Inpatient Rehabilitation Care Coordinator will talk with you frequently to get your input and to update you on team discussions.  Team conferences with you and your family in attendance may also be held.  Expected length of stay: 12-14 days  Overall anticipated outcome: Supervision-CGA level  Depending on your progress and recovery, your program may change. Your Inpatient Rehabilitation Care Coordinator will coordinate services and will keep you informed of any changes. Your Inpatient Rehabilitation Care Coordinator's name and contact numbers are listed  below.  The following services may also be recommended but are not provided by the [facility] will be made to provide these services after discharge if needed.  Arrangements include referral to agencies that provide these services.  Your insurance has been verified to be:  [employer] Your primary doctor is:  Mackie Pai  Pertinent information will  be shared with your doctor and your insurance company.  Inpatient Rehabilitation Care Coordinator:  Ovidio Kin, [place] or Emilia Beck  Information discussed with and copy given to patient by: [name], 10/23/2020, 9:46 AM

## 2020-10-23 NOTE — Progress Notes (Signed)
Physical Therapy Session Note  Patient Details  Name: Andres [name] MRN: [id number] Date of Birth: 02/21/1956  Today's Date: 10/23/2020 PT Individual Time: 0800-0856 PT Individual Time Calculation (min): 56 min   Short Term Goals: Week 1:  PT Short Term Goal 1 (Week 1): Pt will perform supine <>sit with supervision. PT Short Term Goal 2 (Week 1): Pt will perform bed <> w/c squat pivot transfer with CGA. PT Short Term Goal 3 (Week 1): Pt will perform STS at [place] with CGA. PT Short Term Goal 4 (Week 1): Pt will ambulate at least 5 feet using LRAD and Min A. PT Short Term Goal 5 (Week 1): Pt will propel w/c at least 75 feet with supervision.  Skilled Therapeutic Interventions/Progress Updates:   Received pt supine in bed, pt agreeable to therapy, and reported pain 6/10 in R residual limb; RN present to administer pain mediation during session. Pt reported feeling much better today and ready to begin therapy and leave the room. Session with emphasis on functional mobility/transfers, generalized strengthening, dynamic sitting balance/coordination, and improved activity tolerance. Pt transferred supine<>sitting EOB with HOB elevated and use of bedrails with supervision and reported mild dizziness that resolved in <2 minutes. Pt transferred bed<>WC via squat<>pivot to L with min A and sat in WC and brushed teeth, applied deodorant, and washed face and body at sink with supervision. Educated pt on WC parts management including locking/unlocking brakes and donning and doffing legrests and armrests. Pt performed WC mobility 123ft using BUE and supervision to ortho gym. Pt performed BUE strengthening on UBE at level 2.5 increasing to level 3 for 3 minutes forward and 3 minutes backwards with supervision. Discussed discharge planning and pt reported wanting to be able to go up and down stairs at home no matter what it takes as he refuses to have his bed in the living room. Pt has 7 STE with 2 rails but is only  able to reach one and has ~12 steps with L handrail to access bedroom/bathroom on second level. Therapist transported pt to therapy gym and demonstrated shower chair technique for stair navigation. Pt very open to this idea and planning on talking to his wife to find out if they have a shower chair and to measure width of their stairs. Pt transported back to room in [facility] total A and agreeable to stay sitting in [place] until next PT session. Concluded session with pt sitting in WC, needs within reach, and chair pad alarm on.   Therapy Documentation Precautions:  Precautions Precautions: Fall Precaution Comments: wound vac on R Restrictions Weight Bearing Restrictions: Yes RLE Weight Bearing: Non weight bearing  Therapy/Group: Individual Therapy Alfonse Alpers PT, DPT   10/23/2020, 7:16 AM

## 2020-10-23 NOTE — Progress Notes (Signed)
Occupational Therapy Session Note  Patient Details  Name: Andres [name] MRN: [id number] Date of Birth: 1956-06-10  Today's Date: 10/23/2020 OT Individual Time: 1224-8250 OT Individual Time Calculation (min): 74 min    Short Term Goals: Week 1:  OT Short Term Goal 1 (Week 1): Pt will complete BSC transfers with CGA or less. OT Short Term Goal 2 (Week 1): Pt will be able to doff pants over hips on BSC with min A. OT Short Term Goal 3 (Week 1): Pt will be able to pull pants over hips with min -mod A on BSC. OT Short Term Goal 4 (Week 1): Pt will be able to sit to stand with RW with mod A. OT Short Term Goal 5 (Week 1): Pt will be able to tolerate standing with RW for 1 minute.  Skilled Therapeutic Interventions/Progress Updates:    Patient seated in w/c, wife present for therapy session.  He notes that residual limb pain/phantom pain is under control at this time.  Wound vac in place.  Reviewed current strategies for safe transfers at this time and probable progression as his strength and mobility improves.  Reviewed DME options and set up of home environment.  Wife to measure height of bed, she plans to take sliding doors off of shower stall and take measurements/pictures in preparation.  Sit pivot transfer to/from drop arm commode and w/c practiced with min A.  Reviewed clothing management options and possible assistance needs.  Verbal review of tub/shower transfer using transfer bench.  Patient able to propel w/c approx 50 feet to therapy gym.  Reviewed use of transfer board and practiced to/from mat table with CGA.  He tolerated 15 minutes on mat table surface for trunk and core/posture exercises.  Returned to room via w/c - requested to transfer back to drop arm commode in attempt to have BM, utilized SB with CGA, patient able to manage pants down, unable to move bowels at this time.  Mod A for pants up due to fatigue.  SB transfer back to w/c CGA.  Reviewed support options to include peer  visitors and support groups with patient - offer to start process of peer visitor but he declined at this time.  He remained seated in w/c at close of session, call bell and tray table in reach.    Therapy Documentation Precautions:  Precautions Precautions: Fall Precaution Comments: wound vac on R Restrictions Weight Bearing Restrictions: Yes RLE Weight Bearing: Non weight bearing   Therapy/Group: Individual Therapy  Carlos Levering 10/23/2020, 7:44 AM

## 2020-10-23 NOTE — IPOC Note (Signed)
Overall Plan of Care [facility]) Patient Details Name: Andres [name] MRN: [id number] DOB: June 20, 1956  Admitting Diagnosis: Above knee amputation of right lower extremity [facility])  Hospital Problems: Principal Problem:   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[place])     Functional Problem List: Nursing Edema,Endurance,Medication Management,Nutrition,Pain,Sensory,Skin Integrity  PT Balance,Edema,Endurance,Motor,Pain,Perception,Safety,Sensory,Skin Integrity  OT Balance,Endurance,Pain  SLP    TR         Basic ADL's: OT Bathing,Dressing,Toileting     Advanced  ADL's: OT       Transfers: PT Bed Mobility,Bed to [employer]  OT Toilet     Locomotion: PT Ambulation,Wheelchair Mobility,Stairs     Additional Impairments: OT    SLP        TR      Anticipated Outcomes Item Anticipated Outcome  Self Feeding no goal, pt is independent  Swallowing      Basic self-care  supervison with LB self care  Toileting  supervision   Bathroom Transfers supervision  Bowel/Bladder  remain continent and maintain regular emptying  Transfers  Supervision for squat pivot, stand pivot with RW  Locomotion     Communication     Cognition     Pain  less than 4  Safety/Judgment  no falls, skin breakdown, infection   Therapy Plan: PT Intensity: Minimum of 1-2 x/day ,45 to 90 minutes PT Frequency: 5 out of 7 days PT Duration Estimated Length of Stay: 14 days OT Intensity: Minimum of 1-2 x/day, 45 to 90 minutes OT Frequency: 5 out of 7 days OT Duration/Estimated Length of Stay: 12-14 days     Due to the current state of emergency, patients may not be receiving their 3-hours of Medicare-mandated therapy.   Team Interventions: Nursing Interventions Patient/Family Education,Pain Management,Medication Management,Skin Care/Wound [employer]  PT interventions Ambulation/gait training,Balance/vestibular training,Community reintegration,Discharge  planning,DME/adaptive equipment instruction,Functional mobility training,Neuromuscular re-education,Pain management,Patient/family education,Psychosocial support,Skin care/wound management,Splinting/orthotics,Stair training,Therapeutic Activities,Therapeutic Exercise,UE/LE Strength taining/ROM,UE/LE Museum/gallery conservator propulsion/positioning  OT Interventions Balance/vestibular training,Discharge planning,Functional mobility training,DME/adaptive equipment instruction,Pain management,Patient/family education,Psychosocial support,Therapeutic Activities,Self Care/advanced ADL retraining,Therapeutic Exercise,UE/LE Strength taining/ROM  SLP Interventions    TR Interventions    SW/CM Interventions Discharge Planning,Psychosocial Support,Patient/Family Education   Barriers to Discharge MD  Medical stability  Nursing      PT Inaccessible home environment,Home environment access/layout,Wound Care,Lack of/limited family support,Weight,Weight bearing restrictions    OT      SLP      SW Home environment access/layout 7 steps to get into home   Team Discharge Planning: Destination: PT-Home ,OT- Home , SLP-  Projected Follow-up: PT-Home health PT,24 hour supervision/assistance, OT-  Home health OT, SLP-  Projected Equipment Needs: PT-To be determined,3 in 1 bedside comode,Rolling walker with 5" wheels, OT- 3 in 1 bedside comode,To be determined, SLP-  Equipment Details: PT-DME tbd based on pt's progress, OT-to be determined further as depends on if stair lift would be installed prior to pt going home Patient/family involved in discharge planning: PT- Patient,Family member/caregiver,  OT-Patient, SLP-   MD ELOS: 12-14 days Medical Rehab Prognosis:  Excellent Assessment: The patient has been admitted for CIR therapies with the diagnosis of right AKA. The team will be addressing functional mobility, strength, stamina, balance, safety, adaptive techniques and equipment, self-care, bowel and  bladder mgt, patient and caregiver education, pain mgt, pre-prosthetic ecucation, wound care, community reentry. Goals have been set at supervision for mobility and self-care. .   Due to the current state of emergency, patients may not be receiving their 3 hours per day of Medicare-mandated therapy.  Meredith Staggers, MD, FAAPMR      See Team Conference Notes for weekly updates to the plan of care

## 2020-10-23 NOTE — Progress Notes (Incomplete)
Physical Therapy Session Note  Patient Details  Name: Andres [name] MRN: [id number] Date of Birth: [birth year]  Today's Date: 10/23/2020 PT Individual Time: 7353-2992 PT Individual Time Calculation (min): 65 min   Short Term Goals: Week 1:  PT Short Term Goal 1 (Week 1): Pt will perform supine <>sit with supervision. PT Short Term Goal 2 (Week 1): Pt will perform bed <> w/c squat pivot transfer with CGA. PT Short Term Goal 3 (Week 1): Pt will perform STS at [place] with CGA. PT Short Term Goal 4 (Week 1): Pt will ambulate at least 5 feet using LRAD and Min A. PT Short Term Goal 5 (Week 1): Pt will propel w/c at least 75 feet with supervision.  Skilled Therapeutic Interventions/Progress Updates:    PAIN 8/10 w/activity, treatement to tolerance, mult rest breaks, repositioning, cushion change, and additional time provided for management.  Pt seen this am for 60 min session w/focus on mobility, preprosthetic exercises, standing tolerance, seating.  Pt initially oob in wc.  Propels wc 143ft w/cues for efficiency on level surface including mult turns.  Pt c/o sacral area/IT pain , currently on 1in foam cushion w/significant slinging of wc base noted and Therapist obtained 18x18 Jay2 cushion.  Sit to stand in parallel bars/cushions swapped.  Pt stated significantly improved comfort, monitored throughout session, pt pleased w/comfort.  Pt stood x 3, 2, 1  min and performed the following: alteranating arm raises for balance challenge Hip abd/add Hip extesnion x 10-12 each  wc to/from mat lateral scoot w/set up and min assist. Sit to supine w/supervision, additional time Supine R hip extension gradually to flat on mat approx 3 min to achieve neutral Supine glut sets Supine hip abd add x 12 each Sidelying:  Hip exteension, hip abd x 12 each Prone lying x 6 min for extension stretch.  Prone to supine to sit w/supervision, additional time.   wc propulsion back to room approx 133ft mod I back  to room.  Pt left oob in wc w/alarm belt set and needs in reach   Therapy Documentation Precautions:  Precautions Precautions: Fall Precaution Comments: wound vac on R Restrictions Weight Bearing Restrictions: Yes RLE Weight Bearing: Non weight bearing   Therapy/Group: Individual Therapy  Callie Fielding, [place] 10/23/2020, 12:45 PM

## 2020-10-24 DIAGNOSIS — G8918 Other acute postprocedural pain: Secondary | ICD-10-CM

## 2020-10-24 DIAGNOSIS — S78111A Complete traumatic amputation at level between right hip and knee, initial encounter: Secondary | ICD-10-CM

## 2020-10-24 DIAGNOSIS — I10 Essential (primary) hypertension: Secondary | ICD-10-CM

## 2020-10-24 DIAGNOSIS — D62 Acute posthemorrhagic anemia: Secondary | ICD-10-CM

## 2020-10-24 DIAGNOSIS — E871 Hypo-osmolality and hyponatremia: Secondary | ICD-10-CM

## 2020-10-24 DIAGNOSIS — K5903 Drug induced constipation: Secondary | ICD-10-CM

## 2020-10-24 DIAGNOSIS — R7303 Prediabetes: Secondary | ICD-10-CM

## 2020-10-24 NOTE — Progress Notes (Signed)
Patient is status post right above-knee amputation.  He has been transferred to the rehab unit.  He is lying in bed apprehensive about wound VAC removal today  Wound VAC was removed skin was intact patient did have significant pain during the process.  He also complained of phantom pain.  Examination demonstrates well apposed wound edges no erythema swelling is well controlled no evidence of infection no wound dehiscence  Did ask nurse to give him some pain medication.  Can now do daily cleansing with antibacterial soap and water should obtain above-knee amputation shrinker if he does not already have 1 will need 1 week follow-up after discharge with myself or Dr. Sharol Given

## 2020-10-24 NOTE — Progress Notes (Signed)
Residual limb is 310 cm in length

## 2020-10-24 NOTE — Progress Notes (Signed)
Orthopedic Tech Progress Note Patient Details:  Andres [name] 27-Oct-1955 [id number] Called in order to [employer] for an AKA SHRINKER Patient ID: Andres [name], male   DOB: 1956-03-26, 65 y.o.   MRN: [id number]   [name] 10/24/2020, 8:54 AM

## 2020-10-24 NOTE — Progress Notes (Signed)
Occupational Therapy Session Note  Patient Details  Name: Andres [name] MRN: [id number] Date of Birth: 02-Sep-1956  Today's Date: 10/24/2020 OT Individual Time: 3382-5053 OT Individual Time Calculation (min): 60 min    Short Term Goals: Week 1:  OT Short Term Goal 1 (Week 1): Pt will complete BSC transfers with CGA or less. OT Short Term Goal 2 (Week 1): Pt will be able to doff pants over hips on BSC with min A. OT Short Term Goal 3 (Week 1): Pt will be able to pull pants over hips with min -mod A on BSC. OT Short Term Goal 4 (Week 1): Pt will be able to sit to stand with RW with mod A. OT Short Term Goal 5 (Week 1): Pt will be able to tolerate standing with RW for 1 minute.  Skilled Therapeutic Interventions/Progress Updates:    Patient in bed, alert - he notes that it was a rough morning due to vac removal but he is feeling better now and ready for therapy.  Completed LB bathing/dressing bed level with set up.  Supine to sitting CS.  Sit pivot transfer with CGA and set up.  Reviewed w/c set up and part management.  UB bathing/dressing and grooming tasks completed w/c level at sink with set up.  He was able to propel w/c to/from therapy gym, sit pivot transfer to/from mat table CGA.  Completed UB conditioning and core exercises seated on mat table with good seated balance.  He remained seated in w/c at close of session.  Call bell and tray table in reach.  Wife present  Therapy Documentation Precautions:  Precautions Precautions: Fall Precaution Comments: wound vac on R Restrictions Weight Bearing Restrictions: Yes RLE Weight Bearing: Non weight bearing   Therapy/Group: Individual Therapy  Carlos Levering 10/24/2020, 7:37 AM

## 2020-10-24 NOTE — Progress Notes (Signed)
PROGRESS NOTE   Subjective/Complaints: Patient seen laying in bed this morning.  He states he slept well overnight, but is tired this morning because his VAC was removed earlier.  He notes he had a bowel movement and feels much better.  ROS: Denies CP, SOB, N/V/D  Objective:   No results found. Recent Labs    10/23/20 0557  WBC 5.0  HGB 7.9*  HCT 23.5*  PLT 477*   Recent Labs    10/23/20 0557  NA 135  K 3.7  CL 103  CO2 23  GLUCOSE 97  BUN 14  CREATININE 0.96  CALCIUM 8.6*    Intake/Output Summary (Last 24 hours) at 10/24/2020 5643 Last data filed at 10/24/2020 3295 Gross per 24 hour  Intake 460 ml  Output 3800 ml  Net -3340 ml        Physical Exam: Vital Signs Blood pressure 138/79, pulse 78, temperature 98.7 F (37.1 C), temperature source Oral, resp. rate 16, height 5\' 11"  (1.803 m), SpO2 95 %. Constitutional: No distress . Vital signs reviewed. HENT: Normocephalic.  Atraumatic. Eyes: EOMI. No discharge. Cardiovascular: No JVD.  RRR. Respiratory: Normal effort.  No stridor.  Bilateral clear to auscultation. GI: Slightly distended.  BS +. Skin: Warm and dry.  Right stump with dressing CDI Psych: Normal mood.  Normal behavior. Musc:  Right AKA with edema and tenderness Neuro: Alert Motor: Bilateral upper extremities, left lower extremity: 5/5 proximal distal Right lower extremity: Hip flexion: 4/5 (pain inhibition)    Assessment/Plan: 1. Functional deficits which require 3+ hours per day of interdisciplinary therapy in a comprehensive inpatient rehab setting.  Physiatrist is providing close team supervision and 24 hour management of active medical problems listed below.  Physiatrist and rehab team continue to assess barriers to discharge/monitor patient progress toward functional and medical goals  Care Tool:  Bathing    Body parts bathed by patient: Right upper leg,Left upper  leg,Face,Front perineal area,Right arm,Left arm,Chest,Abdomen   Body parts bathed by helper: Right lower leg,Buttocks Body parts n/a: Left lower leg   Bathing assist Assist Level: Minimal Assistance - Patient > 75%     Upper Body Dressing/Undressing Upper body dressing   What is the patient wearing?: Pull over shirt    Upper body assist Assist Level: Independent    Lower Body Dressing/Undressing Lower body dressing      What is the patient wearing?: Underwear/pull up     Lower body assist Assist for lower body dressing: Independent     Toileting Toileting    Toileting assist Assist for toileting: Minimal Assistance - Patient > 75%     Transfers Chair/bed transfer  Transfers assist     Chair/bed transfer assist level: Minimal Assistance - Patient > 75%     Locomotion Ambulation   Ambulation assist   Ambulation activity did not occur: Safety/medical concerns          Walk 10 feet activity   Assist  Walk 10 feet activity did not occur: Safety/medical concerns        Walk 50 feet activity   Assist Walk 50 feet with 2 turns activity did not occur: Safety/medical concerns  Walk 150 feet activity   Assist Walk 150 feet activity did not occur: Safety/medical concerns         Walk 10 feet on uneven surface  activity   Assist Walk 10 feet on uneven surfaces activity did not occur: Safety/medical concerns         Wheelchair     Assist Will patient use wheelchair at discharge?: Yes Type of Wheelchair: Manual Wheelchair activity did not occur: Safety/medical concerns  Wheelchair assist level: Independent Max wheelchair distance: 150    Wheelchair 50 feet with 2 turns activity    Assist    Wheelchair 50 feet with 2 turns activity did not occur: Safety/medical concerns   Assist Level: Independent   Wheelchair 150 feet activity     Assist  Wheelchair 150 feet activity did not occur: Safety/medical  concerns   Assist Level: Independent   Blood pressure 138/79, pulse 78, temperature 98.7 F (37.1 C), temperature source Oral, resp. rate 16, height 5\' 11"  (1.803 m), SpO2 95 %.    Medical Problem List and Plan: 1.  Deficits with mobility, transfers, self-care secondary to right AKA due to necrotizing fasciitis  Continue CIR 2.  Antithrombotics: -DVT/anticoagulation:  Pharmaceutical: Continue Lovenox             -antiplatelet therapy: ASA 3. Pain Management: Oxycodone as needed. Added scheduled Tylenol q8H. Advised patient to take Oxycodone 30 minutes prior to AM therapy and he is agreeable.   Controlled with meds on 2/22             Monitor with increased exertion, particularly for neuropathic pain 4. Mood: Team to provide ego support.              -antipsychotic agents: N/A 5. Neuropsych: This patient is capable of making decisions on his own behalf. 6. Skin/Wound Care: Wound VAC DC'd              On vitamin C and Zinc to promote wound healing. 7. Fluids/Electrolytes/Nutrition: Monitor I/Os.             BMP within acceptable range on 2/21 8. HTN: Monitor BP tid and monitor for trends.              --Off losartan at this time.    Controlled on 2/22             Moderate increased mobility 9. Acute blood loss anemia:              Hemoglobin 7.9 on 2/21             --received 2 units on 02/13             --Received Feraheme  10. Recent Covid: Encourage pulmonary hygiene             --Continue Tessalon perles tid. 11. Pre-diabetes: Hgb A1C-5.9. Continue Carb modified diet.   Controlled on 2/21             Monitor increase mobility 12. Hyponatremia: Continue salt tabs for now.             Sodium 135 on 2/21 13.  Drug-induced constipation:   Adjust bowel meds as necessary  Improving   LOS: 4 days A FACE TO FACE EVALUATION WAS PERFORMED  Westlyn Glaza Lorie Phenix 10/24/2020, 8:21 AM

## 2020-10-24 NOTE — Progress Notes (Addendum)
Physical Therapy Session Note  Patient Details  Name: Andres [name] MRN: [id number] Date of Birth: 1956/05/05  Today's Date: 10/24/2020 PT Individual Time:Session1: 1100-1200; Session2: 1450-1600 PT Individual Time Calculation (min): 60 min & 70 min  Short Term Goals: Week 1:  PT Short Term Goal 1 (Week 1): Pt will perform supine <>sit with supervision. PT Short Term Goal 2 (Week 1): Pt will perform bed <> w/c squat pivot transfer with CGA. PT Short Term Goal 3 (Week 1): Pt will perform STS at [place] with CGA. PT Short Term Goal 4 (Week 1): Pt will ambulate at least 5 feet using LRAD and Min A. PT Short Term Goal 5 (Week 1): Pt will propel w/c at least 75 feet with supervision.  Skilled Therapeutic Interventions/Progress Updates:    Session1:  Patient in w/c with wife present.  Reports had wound vac removed this morning and had a lot of pain, but had medication and pain now 7/10.  Patient propelled w/c to therapy gym with S x 220'.  In parallel bars performed sit to stand with CGA and lateral weight shifts touching hips to bar with CGA.  Then ambulated forward in bars then in reverse x 7'.  Ambulated in bars with CGA forward then turned then back to chair x 2 (26') and noted pt lifting foot each time to turn so reinforced this is good habit to help with preventing falls.  Standing squats in bars x 10 with UE support.    Patient performed slide board transfer to mat with min cues for w/c set up and CGA.  Sit to supine with S.  Patient performed glut sets x 10 w/ 5 sec hold, isometric hip extension x 10 w/ 5 sec hold and sidelying hip extension with cues and min A for positioning on side.  Issued handout for HEP.  Then pt performed sidelying hip abduction on R x 10.  Supine to sit with CGA.  Patient performed SBT to w/c with close S.  Patient propelled in w/c to room 220' with S.  SBT to bed with CGA and sit to supine with S.  Placed shrinker on R residual limb in supine with wife present.  Adjusted  for fit and educated on technique to don with least amount of pain.  Patient left in supine with call bell in reach, wife in the room and bed alarm active.   Session2:  Patient in supine wife leaving and pt reports had phone conference with case manager from worker's comp.  They report not to complete any home modifications until they have been out to see the home.  Discussed options for home entry.  Patient propelled in w/c to ortho gym x 220' with S.  Negotiated ramp in w/c min A to ascend and close S with cues to descend.  Repeated second trial after obtaining w/c gloves for pt still needing min A to negotiate lip on ramp.  Patient performed car transfer with long board SBT with min A and cues for technique.    Patient sit to stand to RW after demonstration for hand placement and stood x 1 minute with S, then tapping targets L & R with CGA.  Then standing to place clothes pins on rails on box L then R hand.  Then pt ambulated x 8' with RW and min A with close w/c follow.  Patient standing at [facility] for tapping letters in sequence with min a for balance and 1 UE support.  Patient transferred to [facility]  Step with min A and performed 6 minutes at level 3 bilateral UE and L LE.  Patient propelled w/c to room with S.  Sit to stand and stand step to bed then back to w/c with CGA.  Patient left up in w/c with seat alarm active and needs in reach.   Therapy Documentation Precautions:  Precautions Precautions: Fall Precaution Comments: wound vac on R Restrictions Weight Bearing Restrictions: Yes RLE Weight Bearing: Non weight bearing Pain: Pain Assessment Pain Score: 7  Pain Type: Surgical pain Pain Location: Leg Pain Orientation: Right Pain Descriptors / Indicators: Tender;Sore Pain Onset: On-going Pain Intervention(s): Repositioned;Emotional support   Therapy/Group: Individual Therapy  Reginia Naas  Magda Kiel, PT 10/24/2020, 11:37 AM

## 2020-10-25 DIAGNOSIS — K5903 Drug induced constipation: Secondary | ICD-10-CM

## 2020-10-25 DIAGNOSIS — S78111A Complete traumatic amputation at level between right hip and knee, initial encounter: Secondary | ICD-10-CM

## 2020-10-25 DIAGNOSIS — D62 Acute posthemorrhagic anemia: Secondary | ICD-10-CM

## 2020-10-25 DIAGNOSIS — G8918 Other acute postprocedural pain: Secondary | ICD-10-CM | POA: Diagnosis not present

## 2020-10-25 DIAGNOSIS — Z8679 Personal history of other diseases of the circulatory system: Secondary | ICD-10-CM

## 2020-10-25 NOTE — Progress Notes (Signed)
Patient ID: Andres [name], male   DOB: Jan 11, 1956, 65 y.o.   MRN: [id number]  Met with pt and wife who is in his room to discuss team conference goals supervision/CGA level and target discharge date of 3/2. Pt will need a ramp or chair lift to get to where he wants to be at home-level. Pt wants to get to the second floor to his bedroom and full bathroom and does not want a hospital bed. Spoke also with Angie-Workers Comp CM to inform of discharge date and his initial needs. Will fax orders but she is saying he will need to do a temporary plan like hospital bed in the living room. Pt does not want this. Pt and wife to talk with Angie regarding their wants for home. Continue to work on discharge needs.

## 2020-10-25 NOTE — Progress Notes (Signed)
Occupational Therapy Session Note  Patient Details  Name: Andres [name] MRN: [id number] Date of Birth: [birth year]  Today's Date: 10/25/2020 OT Individual Time: 1100-1133    &   1300-1400 OT Individual Time Calculation (min): 33 min    &   60 min   Short Term Goals: Week 1:  OT Short Term Goal 1 (Week 1): Pt will complete BSC transfers with CGA or less. OT Short Term Goal 2 (Week 1): Pt will be able to doff pants over hips on BSC with min A. OT Short Term Goal 3 (Week 1): Pt will be able to pull pants over hips with min -mod A on BSC. OT Short Term Goal 4 (Week 1): Pt will be able to sit to stand with RW with mod A. OT Short Term Goal 5 (Week 1): Pt will be able to tolerate standing with RW for 1 minute.  Skilled Therapeutic Interventions/Progress Updates:    AM session:   Patient seated in w/c, ready for therapy and states that pain is under control.  Able to propel w/c to therapy treatment area.  Sit to stand and SPT with RW to/from tub bench, w/c and bed with CGA - reviewed set up of DME, grab bar placement and management of curtain.  Completed UB ergometer 10 minutes on level 3.  He returned to bed at close of session. Bed alarm set and call bell in hand.     PM session:   Patient in bed, states that he had a good rest break, phantom pain at times during session - nursing provided pain meds at start of session.  Supine to sitting edge of bed with CS.  Reviewed DME request with patient and wife - they argee to tub bench and commode (may need 2nd commode for first floor)   SPT with RW  And short distance ambulation with RW CGA.  He is able to propel w/c to and from therapy gym.  Completed unsupported sitting and standing activities to address balance and conditioning with good tolerance.   He is able to maintain standing with reaching tasks for up to 2 minutes with CGA.  Reviewed energy conservation and joint protection.   Reviewed return to driving and resources available.  General review of  hand placement for safe reaching in stance with RW.  Patient demonstrates good understanding of information provided.  He remained seated in w/c at close of session, seat alarm set and callbell/tray table in reach.       Therapy Documentation Precautions:  Precautions Precautions: Fall Precaution Comments: wound vac on R Restrictions Weight Bearing Restrictions: Yes RLE Weight Bearing: Non weight bearing   Therapy/Group: Individual Therapy  Carlos Levering 10/25/2020, 7:31 AM

## 2020-10-25 NOTE — Progress Notes (Signed)
PROGRESS NOTE   Subjective/Complaints: Patient seen laying in bed this AM.  He states he slept well overnight. He has not had a BM today. He was seen by Ortho yesterday, notes reviewed-VAC removed outside.  ROS: Denies CP, SOB, N/V/D  Objective:   No results found. Recent Labs    10/23/20 0557  WBC 5.0  HGB 7.9*  HCT 23.5*  PLT 477*   Recent Labs    10/23/20 0557  NA 135  K 3.7  CL 103  CO2 23  GLUCOSE 97  BUN 14  CREATININE 0.96  CALCIUM 8.6*    Intake/Output Summary (Last 24 hours) at 10/25/2020 0905 Last data filed at 10/25/2020 0500 Gross per 24 hour  Intake 240 ml  Output 3805 ml  Net -3565 ml        Physical Exam: Vital Signs Blood pressure (!) 141/75, pulse 77, temperature 98.5 F (36.9 C), temperature source Oral, resp. rate 18, height 5\' 11"  (1.803 m), SpO2 96 %. Constitutional: No distress . Vital signs reviewed. HENT: Normocephalic.  Atraumatic. Eyes: EOMI. No discharge. Cardiovascular: No JVD.  RRR. Respiratory: Normal effort.  No stridor.  Bilateral clear to auscultation. GI: Slightly distended.  BS +. Skin: Warm and dry.  Right stump with sutures CDI. Psych: Normal mood.  Normal behavior. Musc:  Right AKA with edema and tenderness, stable Neuro: Alert Motor: Bilateral upper extremities, left lower extremity: 5/5 proximal distal Right lower extremity: Hip flexion: 4/5 (pain inhibition), unchanged  Assessment/Plan: 1. Functional deficits which require 3+ hours per day of interdisciplinary therapy in a comprehensive inpatient rehab setting.  Physiatrist is providing close team supervision and 24 hour management of active medical problems listed below.  Physiatrist and rehab team continue to assess barriers to discharge/monitor patient progress toward functional and medical goals  Care Tool:  Bathing    Body parts bathed by patient: Right upper leg,Left upper leg,Face,Front perineal  area,Right arm,Left arm,Chest,Abdomen,Buttocks,Right lower leg   Body parts bathed by helper: Right lower leg,Buttocks Body parts n/a: Left lower leg   Bathing assist Assist Level: Supervision/Verbal cueing     Upper Body Dressing/Undressing Upper body dressing   What is the patient wearing?: Pull over shirt    Upper body assist Assist Level: Set up assist    Lower Body Dressing/Undressing Lower body dressing      What is the patient wearing?: Pants     Lower body assist Assist for lower body dressing: Supervision/Verbal cueing     Toileting Toileting    Toileting assist Assist for toileting: Minimal Assistance - Patient > 75%     Transfers Chair/bed transfer  Transfers assist     Chair/bed transfer assist level: Minimal Assistance - Patient > 75%     Locomotion Ambulation   Ambulation assist   Ambulation activity did not occur: Safety/medical concerns  Assist level: Minimal Assistance - Patient > 75% Assistive device: Parallel bars Max distance: 26'   Walk 10 feet activity   Assist  Walk 10 feet activity did not occur: Safety/medical concerns  Assist level: Minimal Assistance - Patient > 75% Assistive device: Parallel bars   Walk 50 feet activity   Assist Walk 50 feet  with 2 turns activity did not occur: Safety/medical concerns         Walk 150 feet activity   Assist Walk 150 feet activity did not occur: Safety/medical concerns         Walk 10 feet on uneven surface  activity   Assist Walk 10 feet on uneven surfaces activity did not occur: Safety/medical concerns         Wheelchair     Assist Will patient use wheelchair at discharge?: Yes Type of Wheelchair: Manual Wheelchair activity did not occur: Safety/medical concerns  Wheelchair assist level: Supervision/Verbal cueing Max wheelchair distance: 150    Wheelchair 50 feet with 2 turns activity    Assist    Wheelchair 50 feet with 2 turns activity did not  occur: Safety/medical concerns   Assist Level: Supervision/Verbal cueing   Wheelchair 150 feet activity     Assist  Wheelchair 150 feet activity did not occur: Safety/medical concerns   Assist Level: Supervision/Verbal cueing   Blood pressure (!) 141/75, pulse 77, temperature 98.5 F (36.9 C), temperature source Oral, resp. rate 18, height 5\' 11"  (1.803 m), SpO2 96 %.    Medical Problem List and Plan: 1.  Deficits with mobility, transfers, self-care secondary to right AKA due to necrotizing fasciitis  Continue CIR  Team conference today to discuss current and goals and coordination of care, home and environmental barriers, and discharge planning with nursing, case manager, and therapies. Please see conference note from today as well.  2.  Antithrombotics: -DVT/anticoagulation:  Pharmaceutical: Continue Lovenox             -antiplatelet therapy: ASA 3. Pain Management: Oxycodone as needed. Added scheduled Tylenol q8H. Advised patient to take Oxycodone 30 minutes prior to AM therapy and he is agreeable.   Controlled with meds on 2/23             Monitor with increased exertion, particularly for neuropathic pain 4. Mood: Team to provide ego support.              -antipsychotic agents: N/A 5. Neuropsych: This patient is capable of making decisions on his own behalf. 6. Skin/Wound Care: Wound VAC DC'd              On vitamin C and Zinc to promote wound healing. 7. Fluids/Electrolytes/Nutrition: Monitor I/Os.             BMP within acceptable range on 2/21 8. HTN: Monitor BP tid and monitor for trends.              Off losartan at this time.    Controlled on 2/23             Moderate increased mobility 9. Acute blood loss anemia:              Hemoglobin 7.9 on 2/21, labs ordered for tomorrow             --received 2 units on 02/13             --Received Feraheme  10. Recent Covid: Encourage pulmonary hygiene             --Continue Tessalon perles tid. 11. Pre-diabetes: Hgb  A1C-5.9. Continue Carb modified diet.   Controlled on 2/21             Monitor increase mobility 12. Hyponatremia: Continue salt tabs for now.             Sodium 135 on 2/21 13.  Drug-induced constipation:   Adjust bowel meds as necessary  Will consider increase if no BM today  LOS: 5 days A FACE TO FACE EVALUATION WAS PERFORMED  Andres [name] Andres [name] 10/25/2020, 9:05 AM

## 2020-10-25 NOTE — Patient Care Conference (Signed)
Inpatient RehabilitationTeam Conference and Plan of Care Update Date: 10/25/2020   Time: 11:43 AM    Patient Name: Andres [name]      Medical Record Number: [id number]  Date of Birth: 24-Dec-1955 Sex: Male         Room/Bed: 4M11C/4M11C-01 Payor Info: Payor: [employer] / Plan: [employer]BS OTHER / Product Type: *No Product type* /    Admit Date/Time:  10/20/2020  3:56 PM  Primary Diagnosis:  Above knee amputation of right lower extremity [facility])  Hospital Problems: Principal Problem:   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HCC) Active Problems:   Drug induced constipation   Postoperative pain   Post-operative pain   History of hypertension    Expected Discharge Date: Expected Discharge Date: 11/01/20  Team Members Present: Physician leading conference: Dr. Delice Lesch Care Coodinator Present: Dorien Chihuahua, RN, BSN, CRRN;Becky Dupree, LCSW Nurse Present: Isla Pence, RN PT Present: Magda Kiel, PT OT Present: Elisabeth Most, OT PPS Coordinator present : Gunnar Fusi, Novella Olive, PT     Current Status/Progress Goal Weekly Team Focus  Bowel/Bladder   Pt is continent x2.  Pt will remain continent x2.  Assess q shift and prn.   Swallow/Nutrition/ Hydration             ADL's   lower body bathing/dressing set up bed level, upper body bathing/dressing set up w/c level at sink, toileting min A on drop arm commode, sit pivot and SB transfers CGA occ min A  set up/supervision adl and transfers - shower level and with LRAD  DME/assistive device training, adl/transfer training, family education, general conditioning   Mobility   supine<>sit supervision, squat<>pivot transfers min A, sit<>stand in // bars with min A, WC mobility 141ft supervision  supervision, CGA gait, set up assist for WC mobility  functional mobility/transfers, generalized strengthening, dynamic standing balance/coordination, amputee education, improved endurance.   Communication              Safety/Cognition/ Behavioral Observations            Pain             Skin   Pt has R BKA; wound vac removed 2/22.  Skin will continue to heal and remain infection free.  Assess q shift and prn.     Discharge Planning:  Home with wife who is retired and can provide assist. Pt's is a Workers Comp case will work through [employer] regarding equipment and follow up   Team Discussion: Off medications for HTN, MD monitoring labs. Constipation addressed. Continue precautions for MRSA. Progress limited by pain. Scant drainage from incision. Patient on target to meet rehab goals: yes, currently set up - supervision for ADLs and CGA for clothes management and able to ambulate with a rolling walker and min assist.  *See Care Plan and progress notes for long and short-term goals.   Revisions to Treatment Plan:  Stair management practice needed for discharge  Teaching Needs: Transfers,toileting, skin care, medications, etc.   Current Barriers to Discharge: Decreased caregiver support and Home enviroment access/layout  Possible Resolutions to Barriers: DME recommended Ramp for entry to home/home modification Family education     Medical Summary Current Status: Deficits with mobility, transfers, self-care secondary to right AKA due to necrotizing fasciitis  Barriers to Discharge: Weight;Weight bearing restrictions;Medical stability;Wound care   Possible Resolutions to Barriers/Weekly Focus: Therapies, optimize pain meds, optimize bowel meds, fllow labs - Hb, optimize DM meds   Continued Need for Acute  Rehabilitation Level of Care: The patient requires daily medical management by a physician with specialized training in physical medicine and rehabilitation for the following reasons: Direction of a multidisciplinary physical rehabilitation program to maximize functional independence : Yes Medical management of patient stability for increased activity during participation in an intensive  rehabilitation regime.: Yes Analysis of laboratory values and/or radiology reports with any subsequent need for medication adjustment and/or medical intervention. : Yes   I attest that I was present, lead the team conference, and concur with the assessment and plan of the team.   Dorien Chihuahua B 10/25/2020, 4:08 PM

## 2020-10-25 NOTE — Progress Notes (Signed)
Physical Therapy Session Note  Patient Details  Name: Andres [name] MRN: [id number] Date of Birth: [birth year]  Today's Date: 10/25/2020 PT Individual Time:Session1: 8563-1497; Arthor Captain: [id number] PT Individual Time Calculation (min): 57 min & 50 min  Short Term Goals: Week 1:  PT Short Term Goal 1 (Week 1): Pt will perform supine <>sit with supervision. PT Short Term Goal 2 (Week 1): Pt will perform bed <> w/c squat pivot transfer with CGA. PT Short Term Goal 3 (Week 1): Pt will perform STS at [place] with CGA. PT Short Term Goal 4 (Week 1): Pt will ambulate at least 5 feet using LRAD and Min A. PT Short Term Goal 5 (Week 1): Pt will propel w/c at least 75 feet with supervision.  Skilled Therapeutic Interventions/Progress Updates:    Session1:  Patient in supine and reports some overnight feelings of "why me" but doing okay today.  Removed shrinker to visualize leg then MD in to visit and removed dressing.  Patient rolled to assist with donning of shrinker (total A).  Patient supine to sit with S increased time and used rail.  Patient seated to doff slipper sock and don sock and shoe with close S.  Patient transferred to w/c via slide board with S.  Patient propelled w/c to therapy gym.  Transferred to mat with SBT with S.  Sit to supine then to prone with S.  Prone lying x 2-3 minutes.  Performed therex as noted below.  Patient supine to sit with S and transferred sit to stand CGA.  Ambulated with RW x 50' with min A.  Propelled in w/c to room with S.  Left in chair with call bell in reach and char alarm active.    [place]:  Patient in supine and reports increased phantom pain this pm.  Felt he may have been in w/c too long.  Supine to sit with S.  Performed scoot transfer no board to w/c with CGA.  Propelled in w/c over 500' mod I negotiating ramp unaided x 2.  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  Patient issued handout for home measurements and took measurement of hips for w/c prescription.  Patient ambulated x 104' with RW and min A cues to limit distance to avoid overworking L leg.  Patient propelled to room and left up in w/c with chair alarm active and needs in reach.  Therapy Documentation Precautions:  Precautions Precautions: Fall Precaution Comments: wound vac on R Restrictions Weight Bearing Restrictions: Yes RLE Weight Bearing: Non weight bearing Pain: Pain Assessment Pain Scale: 0-10 Pain Score: 5  Pain Type: Surgical pain Pain Location: Leg Pain Orientation: Right Pain Descriptors / Indicators: Tingling Pain Frequency: Constant Pain Onset: On-going Pain Intervention(s): Repositioned;Ambulation/increased activity   Exercises: Amputee Exercises Gluteal Sets: Strengthening;Both;10 reps;Supine Towel Squeeze: Strengthening;Both;10 reps;Supine Hip Extension: Strengthening;Right;Sidelying;10 reps Hip ABduction/ADduction: Strengthening;Right;10 reps;Sidelying Other Exercises Other Exercises: prone lying x 3 minutes Other Exercises: single leg bridge on L x 10 Other Exercises: isometric hip extension on R x 10 Other Exercises: prone L hip extension x 10 Other Exercises: prone L knee flexion x 10   Therapy/Group: Individual Therapy  Reginia Naas  [place], PT 10/25/2020, 9:20 AM

## 2020-10-25 NOTE — Evaluation (Signed)
Recreational Therapy Assessment and Plan  Patient Details  Name: Andres [name] MRN: [id number] Date of Birth: 1956-01-22 Today's Date: 10/25/2020  Rehab Potential:  Good ELOS:   d/c 3/2  Assessment   Hospital Problem: Principal Problem:   Above knee amputation of right lower extremity [facility])   Past Medical History:      Past Medical History:  Diagnosis Date  . Basal cell carcinoma   . Hypertension    Past Surgical History:       Past Surgical History:  Procedure Laterality Date  . AMPUTATION Right 10/15/2020   Procedure: AMPUTATION ABOVE KNEE;  Surgeon: Newt Minion, MD;  Location: [place];  Service: Orthopedics;  Laterality: Right;  . FASCIOTOMY Right 10/11/2020   Procedure: RIGHT LEG FASCIOTOMIES AND DEBRIDE ULCER;  Surgeon: Newt Minion, MD;  Location: [place];  Service: Orthopedics;  Laterality: Right;  . I & D EXTREMITY Right 10/13/2020   Procedure: REPEAT DEBRIDEMENT RIGHT LEG;  Surgeon: Newt Minion, MD;  Location: [place];  Service: Orthopedics;  Laterality: Right;    Assessment & Plan Clinical Impression: Patient is a 65 y.o. male with history of HTN, BCC who was admitted on 10/05/2020 with right lower extremity edema and erythema due to cellulitis. History taken from chart review and patient. He also found to be Covid positive but as asymptomatic was treated with remdesivir as well as Rocephin/daptomycin. MRI of the right lower extremity ordered due to progressive leukocytosis and showed mild ascites of anterior tibialis and soleus muscle. Dr. Sharol Given consulted due to progressive leucocytosis and patient underwent 4 compartment right lower extremity fasciotomies with I &D on 10/11/2020 as well as repeat I &D on 10/13/2020 which revealed progressive muscle necrosis, therefore patient underwent right AKA on 10/15/2020. Wound culture positive for rare MRSA and BC with 1/2 positive for Corynebacterium felt to be contaminant. Has completed 2 week regimen of  rocephin/daptomycin and gabapentin was added for management of neuropathy. Further complicated by postoperative pain and acute blood loss anemia requiring 2 units PRBC. Pre-renal azotemia resolved with IVF for hydration. Therapy ongoing and patient continues to require encouragement with increased time and effort for mobility, has poor safety awareness, limitations in self-care. As well as fear affecting mobility and ADLs.  Patient transferred to CIR on 10/20/2020 .   Pt presents with decreased activity tolerance, decreased functional mobility, decreased balance Limiting pt's independence with leisure/community pursuits.  Met with pt to discuss leisure interests, activity analysis including identification of potential modifications, self advocacy and coping.   Pt states motivation to return home and to participate in previously enjoyed activities.   Plan  No further TR, pt discharging home 3/2  Recommendations for other services: None   Discharge Criteria: Patient will be discharged from TR if patient refuses treatment 3 consecutive times without medical reason.  If treatment goals not met, if there is a change in medical status, if patient makes no progress towards goals or if patient is discharged from hospital.  The above assessment, treatment plan, treatment alternatives and goals were discussed and mutually agreed upon: by patient  [place] 10/25/2020, 8:45 AM

## 2020-10-26 ENCOUNTER — Ambulatory Visit: Payer: BLUE CROSS/BLUE SHIELD | Admitting: Cardiology

## 2020-10-26 DIAGNOSIS — S78111A Complete traumatic amputation at level between right hip and knee, initial encounter: Secondary | ICD-10-CM | POA: Diagnosis not present

## 2020-10-26 DIAGNOSIS — D62 Acute posthemorrhagic anemia: Secondary | ICD-10-CM | POA: Diagnosis not present

## 2020-10-26 DIAGNOSIS — K5903 Drug induced constipation: Secondary | ICD-10-CM | POA: Diagnosis not present

## 2020-10-26 DIAGNOSIS — G8918 Other acute postprocedural pain: Secondary | ICD-10-CM | POA: Diagnosis not present

## 2020-10-26 LAB — CBC WITH DIFFERENTIAL/PLATELET
Abs Immature Granulocytes: 0.02 10*3/uL (ref 0.00–0.07)
Basophils Absolute: 0 10*3/uL (ref 0.0–0.1)
Basophils Relative: 1 %
Eosinophils Absolute: 0.6 10*3/uL — ABNORMAL HIGH (ref 0.0–0.5)
Eosinophils Relative: 13 %
HCT: 27 % — ABNORMAL LOW (ref 39.0–52.0)
Hemoglobin: 8.4 g/dL — ABNORMAL LOW (ref 13.0–17.0)
Immature Granulocytes: 0 %
Lymphocytes Relative: 40 %
Lymphs Abs: 1.8 10*3/uL (ref 0.7–4.0)
MCH: 28.3 pg (ref 26.0–34.0)
MCHC: 31.1 g/dL (ref 30.0–36.0)
MCV: 90.9 fL (ref 80.0–100.0)
Monocytes Absolute: 0.4 10*3/uL (ref 0.1–1.0)
Monocytes Relative: 8 %
Neutro Abs: 1.8 10*3/uL (ref 1.7–7.7)
Neutrophils Relative %: 38 %
Platelets: 424 10*3/uL — ABNORMAL HIGH (ref 150–400)
RBC: 2.97 MIL/uL — ABNORMAL LOW (ref 4.22–5.81)
RDW: 20.7 % — ABNORMAL HIGH (ref 11.5–15.5)
WBC: 4.6 10*3/uL (ref 4.0–10.5)
nRBC: 0 % (ref 0.0–0.2)

## 2020-10-26 MED ORDER — BENZONATATE 100 MG PO CAPS: 200.0000 mg | ORAL_CAPSULE | Freq: Three times a day (TID) | ORAL | Status: DC | PRN

## 2020-10-26 MED ORDER — ENSURE MAX PROTEIN PO LIQD
11.0000 [oz_av] | Freq: Two times a day (BID) | ORAL | Status: DC
  Administered 2020-10-26 – 2020-11-03 (×14): 11 [oz_av] via ORAL

## 2020-10-26 NOTE — Progress Notes (Addendum)
Physical Therapy Session Note  Patient Details  Name: Andres [name] MRN: [id number] Date of Birth: 02/10/1956  Today's Date: 10/26/2020 PT Individual Time:Session1: 4765-4650; Session2:1500-1558 PT Individual Time Calculation (min): 60 min & 58 min  Short Term Goals: Week 1:  PT Short Term Goal 1 (Week 1): Pt will perform supine <>sit with supervision. PT Short Term Goal 2 (Week 1): Pt will perform bed <> w/c squat pivot transfer with CGA. PT Short Term Goal 3 (Week 1): Pt will perform STS at [place] with CGA. PT Short Term Goal 4 (Week 1): Pt will ambulate at least 5 feet using LRAD and Min A. PT Short Term Goal 5 (Week 1): Pt will propel w/c at least 75 feet with supervision.  Skilled Therapeutic Interventions/Progress Updates:    Session1:  Patient in supine.  Reports still with phantom pains this am.  Took meds recently.  Patient doffed shrinker and RN in to visualize wound.  Fit new shrinker and pt donned with min A and cues.  Patient supine to sit with S and donned shoe on L  LE.  Sit to stand and SPT to w/c with CGA.  Patient in w/c for visit from MD.  Verbalized need for home modifications prior to d/c and Worker's [employer] not coming till next week. Patient propelled w/c x 200' to therapy gym. Performed seated w/c push ups 2 x 5 reps.  Patient performed step ups with RW to 4" step with min A x 3 reps.  Ambulated 140' with RW and CGA cues for forward gaze and pt stopping at times to reposition R residual limb.  Patient propelled to room in w/c and left seated with call bell in reach and chair alarm activated.  Session2:Patient in supine, reports just had pain medication recently and better controlled than earlier today.  Patient propelled w/c x 200' to therapy gym.  Seated to discuss HEP and build for handout.  Patient negotiated 4 steps using shower chair for seated rise to next step with min A to reposition chair each step.  Patient reported his railing at home is not safe enough on inside  steps to attempt with this method.  Patient sit to stand and step to mat with RW and CGA.  Sit to supine, then prone with S.  Lying with towel roll at front of R residual limb x 2 minutes for stretch to hip flexor.  Then performed remainder of HEP as noted below.  Patient supine to sit with S, but educated easier to come up from sidelying.  Patient transfer to w/c with S squat pivot.  Patient sit to stand and ambulated with CGA x 170'.  Patient propelled to room and left up in w/c with call bell in reach and chair alarm active.    Access Code: [email or web address] URL: [email or web address] Date: 10/26/2020 Prepared by: Caren Griffins  Exercises Supine Single Leg Bridge with Sound Leg (AKA) - 1 x daily - 7 x weekly - 2 sets - 10 reps Supine Hip Extension with Towel Roll (AKA) - 1 x daily - 7 x weekly - 2 sets - 10 reps Prone Hip Flexor Stretch with Towel Roll (AKA) - 1 x daily - 7 x weekly - 2 sets - 10 reps Prone Hip Extension with Residual Limb (AKA) - 1 x daily - 7 x weekly - 2 sets - 10 reps Prone Gluteal Set with Towel Roll (AKA) - 1 x daily - 7 x weekly - 2 sets - 10 reps Sidelying Hip Abduction (AKA) -  1 x daily - 7 x weekly - 2 sets - 10 reps Armchair Push Up - 1 x daily - 7 x weekly - 2 sets - 10 reps  Therapy Documentation Precautions:  Precautions Precautions: Fall Precaution Comments: wound vac on R Restrictions Weight Bearing Restrictions: Yes RLE Weight Bearing: Non weight bearing Pain: Pain Assessment Pain Scale: 0-10 Pain Score: 5  Pain Type: Phantom pain Pain Location: Leg Pain Orientation: Right Pain Descriptors / Indicators: Throbbing Pain Frequency: Constant Pain Onset: On-going Pain Intervention(s): Aromatherapy (only with completed competency);Distraction   Therapy/Group: Individual Therapy  Reginia Naas  [place], PT 10/26/2020, 9:20 AM

## 2020-10-26 NOTE — Progress Notes (Addendum)
Patient ID: Andres [name], male   DOB: 1955/12/30, 65 y.o.   MRN: [id number] have faxed equipment and home health referrals to [employer]. Pt and wife aware and will follow up with her regarding not wanting a hospital bed and wanting to have access to bedroom on top floor  3:28 PM Spoke with Angela-CM-Workers [employer] regarding equipment needs and home health. She is talking with pt and wife regarding short term of hospital bed in living area. They are deciding. She reports the OT will fly in from [facility] [place] to evaluate home for recommendations, so probably not be discharging on 3/2. Aware will need a ramp to get into the home. Prescriptions faxed to her. Angela-Workers [employer] CM working with pt and wife on discharge needs. Made aware can not just stay here until all arrangements made this is a hospital.

## 2020-10-26 NOTE — Progress Notes (Signed)
PROGRESS NOTE   Subjective/Complaints: Patient seen sitting up in a chair this morning working with therapies.  He states he slept well overnight after receiving his medications.  He notes phantom limb pain overnight, but it was resolved with medications.  He has questions regarding home accessibility in coordination with Workmen's Comp. He notes he had a bowel movement last night.  ROS: Denies CP, SOB, N/V/D  Objective:   No results found. Recent Labs    10/26/20 0513  WBC 4.6  HGB 8.4*  HCT 27.0*  PLT 424*   No results for input(s): NA, K, CL, CO2, GLUCOSE, BUN, CREATININE, CALCIUM in the last 72 hours.  Intake/Output Summary (Last 24 hours) at 10/26/2020 1220 Last data filed at 10/26/2020 0800 Gross per 24 hour  Intake 698 ml  Output 2625 ml  Net -1927 ml        Physical Exam: Vital Signs Blood pressure 139/87, pulse 69, temperature 98.3 F (36.8 C), temperature source Oral, resp. rate 16, height 5\' 11"  (1.803 m), SpO2 96 %. Constitutional: No distress . Vital signs reviewed. HENT: Normocephalic.  Atraumatic. Eyes: EOMI. No discharge. Cardiovascular: No JVD.  RRR. Respiratory: Normal effort.  No stridor.  Bilateral clear to auscultation. GI: Slightly distended.  BS +. Skin: Warm and dry.   Right stump with dressing CDI Psych: Normal mood.  Normal behavior. Musc:  Right AKA with edema and tenderness, improving Neuro: Alert Motor: Bilateral upper extremities, left lower extremity: 5/5 proximal distal Right lower extremity: Hip flexion: 4/5 (pain inhibition), stable  Assessment/Plan: 1. Functional deficits which require 3+ hours per day of interdisciplinary therapy in a comprehensive inpatient rehab setting.  Physiatrist is providing close team supervision and 24 hour management of active medical problems listed below.  Physiatrist and rehab team continue to assess barriers to discharge/monitor patient  progress toward functional and medical goals  Care Tool:  Bathing    Body parts bathed by patient: Right upper leg,Left upper leg,Face,Front perineal area,Right arm,Left arm,Chest,Abdomen,Buttocks,Right lower leg   Body parts bathed by helper: Right lower leg,Buttocks Body parts n/a: Left lower leg   Bathing assist Assist Level: Supervision/Verbal cueing     Upper Body Dressing/Undressing Upper body dressing   What is the patient wearing?: Pull over shirt    Upper body assist Assist Level: Set up assist    Lower Body Dressing/Undressing Lower body dressing      What is the patient wearing?: Pants     Lower body assist Assist for lower body dressing: Supervision/Verbal cueing     Toileting Toileting    Toileting assist Assist for toileting: Minimal Assistance - Patient > 75%     Transfers Chair/bed transfer  Transfers assist     Chair/bed transfer assist level: Contact Guard/Touching assist     Locomotion Ambulation   Ambulation assist   Ambulation activity did not occur: Safety/medical concerns  Assist level: Minimal Assistance - Patient > 75% Assistive device: Walker-rolling Max distance: 104'   Walk 10 feet activity   Assist  Walk 10 feet activity did not occur: Safety/medical concerns  Assist level: Minimal Assistance - Patient > 75% Assistive device: Walker-rolling   Walk 50 feet activity  Assist Walk 50 feet with 2 turns activity did not occur: Safety/medical concerns  Assist level: Minimal Assistance - Patient > 75% Assistive device: Walker-rolling    Walk 150 feet activity   Assist Walk 150 feet activity did not occur: Safety/medical concerns         Walk 10 feet on uneven surface  activity   Assist Walk 10 feet on uneven surfaces activity did not occur: Safety/medical concerns         Wheelchair     Assist Will patient use wheelchair at discharge?: Yes Type of Wheelchair: Manual Wheelchair activity did not  occur: Safety/medical concerns  Wheelchair assist level: Independent Max wheelchair distance: 300    Wheelchair 50 feet with 2 turns activity    Assist    Wheelchair 50 feet with 2 turns activity did not occur: Safety/medical concerns   Assist Level: Independent   Wheelchair 150 feet activity     Assist  Wheelchair 150 feet activity did not occur: Safety/medical concerns   Assist Level: Independent   Blood pressure 139/87, pulse 69, temperature 98.3 F (36.8 C), temperature source Oral, resp. rate 16, height 5\' 11"  (1.803 m), SpO2 96 %.    Medical Problem List and Plan: 1.  Deficits with mobility, transfers, self-care secondary to right AKA due to necrotizing fasciitis  Continue CIR 2.  Antithrombotics: -DVT/anticoagulation:  Pharmaceutical: Continue Lovenox             -antiplatelet therapy: ASA 3. Pain Management: Oxycodone as needed. Added scheduled Tylenol q8H. Advised patient to take Oxycodone 30 minutes prior to AM therapy and he is agreeable.   Controlled with meds on 2/24             Monitor with increased exertion, particularly for neuropathic pain 4. Mood: Team to provide ego support.              -antipsychotic agents: N/A 5. Neuropsych: This patient is capable of making decisions on his own behalf. 6. Skin/Wound Care: Wound VAC DC'd              On vitamin C and Zinc to promote wound healing. 7. Fluids/Electrolytes/Nutrition: Monitor I/Os.             BMP within acceptable range on 2/21 8. HTN: Monitor BP tid and monitor for trends.              Off losartan at this time.    Controlled on 2/24             Moderate increased mobility 9. Acute blood loss anemia:              Hemoglobin 8.4 on 2/24             --received 2 units on 02/13             --Received Feraheme  10. Recent Covid: Encourage pulmonary hygiene             --Continue Tessalon perles tid. 11. Pre-diabetes: Hgb A1C-5.9. Continue Carb modified diet.   Controlled on 2/21              Monitor increase mobility 12. Hyponatremia: Continue salt tabs for now.             Sodium 135 on 2/21 13.  Drug-induced constipation:   Adjust bowel meds as necessary  Improving  LOS: 6 days A FACE TO FACE EVALUATION WAS PERFORMED  Jeane Cashatt Lorie Phenix 10/26/2020, 12:20 PM

## 2020-10-26 NOTE — Progress Notes (Deleted)
Bowel program: cleared rectal vault, administered suppository; no results at this time.

## 2020-10-26 NOTE — Progress Notes (Signed)
Nutrition Follow-up  RD working remotely.  DOCUMENTATION CODES:   Not applicable  INTERVENTION:   - Please obtain updated weight, last available weight is from 10/05/20  - Ensure Max po BID, each supplement provides 150 kcal and 30 grams protein  - Continue Juven po BID, each packet provides 95 calories, 2.5 grams of protein, and 9.8 grams of carbohydrate; also contains L-arginine and L-glutamine, vitamin C, vitamin E, vitamin B-12, zinc, calcium, and calcium Beta-hydroxy-Beta-methylbutyrate to support wound healing  NUTRITION DIAGNOSIS:   Increased nutrient needs related to post-op healing (s/p R AKA seondary to LE necrotizing fascitis) as evidenced by estimated needs.  Ongoing  GOAL:   Patient will meet greater than or equal to 90% of their needs  Progressing  MONITOR:   Labs,I & O's,Supplement acceptance,PO intake,Weight trends,Skin  REASON FOR ASSESSMENT:   Consult Diet education  ASSESSMENT:   65 year old male admitted to inpatient rehab following hospital admission for necrotizing fascitis of right lower extremity with myositis secondary to MRSA infection and incidental COVID-19 infection. 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 Past medical history of HTN, CABG, GERD, basal cell carcinoma, asthma, and depression.  2/22 - wound VAC removed  Noted target d/c date of 3/2.  PO intake is excellent with 7 out of last 8 meals charted as 100% meal completion. Pt accepting >90% of oral nutrition supplements ordered. Pt seems to prefer Ensure Max over Ensure Enlive so will adjust regimen to reflect preferences. Unable to reach pt via phone call to room.  Meal Completion: 80-100% (averaging 98%)  Medications reviewed and include: vitamin C 500 mg daily, colace, Ensure Enlive daily, Juven BID, Ensure Max daily, senna, IV feraheme   Labs reviewed: hemoglobin 8.4  UOP: 2450 ml x 24 hours  NUTRITION -  FOCUSED PHYSICAL EXAM:  Unable to complete at this time. RD working remotely.  Diet Order:   Diet Order            Diet Carb Modified Fluid consistency: Thin; Room service appropriate? Yes  Diet effective now                 EDUCATION NEEDS:   Education needs have been addressed  Skin:  Skin Assessment: Skin Integrity Issues: Incisions: closed: right leg and right thigh  Last BM:  10/25/20 medium type 4  Height:   Ht Readings from Last 1 Encounters:  10/20/20 5\' 11"  (1.803 m)    Weight:   Wt Readings from Last 1 Encounters:  10/05/20 116.9 kg    BMI:  Body mass index is 35.94 kg/m.  Estimated Nutritional Needs:   Kcal:  2500-2700  Protein:  140-160 grams  Fluid:  >/= 2.5 L    Gustavus Bryant, MS, RD, LDN Inpatient Clinical Dietitian Please see [facility] for contact information.

## 2020-10-27 DIAGNOSIS — K5903 Drug induced constipation: Secondary | ICD-10-CM | POA: Diagnosis not present

## 2020-10-27 DIAGNOSIS — G8918 Other acute postprocedural pain: Secondary | ICD-10-CM | POA: Diagnosis not present

## 2020-10-27 DIAGNOSIS — S78111A Complete traumatic amputation at level between right hip and knee, initial encounter: Secondary | ICD-10-CM | POA: Diagnosis not present

## 2020-10-27 DIAGNOSIS — Z8679 Personal history of other diseases of the circulatory system: Secondary | ICD-10-CM | POA: Diagnosis not present

## 2020-10-27 MED ORDER — SENNA 8.6 MG PO TABS
2.0000 | ORAL_TABLET | Freq: Two times a day (BID) | ORAL | Status: DC
  Administered 2020-10-27 – 2020-11-03 (×14): 17.2 mg via ORAL
  Filled 2020-10-27 (×14): qty 2

## 2020-10-27 MED ORDER — POLYETHYLENE GLYCOL 3350 17 G PO PACK
17.0000 g | PACK | Freq: Two times a day (BID) | ORAL | Status: DC
  Administered 2020-10-27 – 2020-11-03 (×13): 17 g via ORAL
  Filled 2020-10-27 (×14): qty 1

## 2020-10-27 NOTE — Progress Notes (Signed)
PROGRESS NOTE   Subjective/Complaints: Patient seen laying in bed this morning.  He states he slept well overnight.  He states he has not had a bowel movement.  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  ROS: + Constipation.  Denies CP, SOB, N/V/D  Objective:   No results found. Recent Labs    10/26/20 0513  WBC 4.6  HGB 8.4*  HCT 27.0*  PLT 424*   No results for input(s): NA, K, CL, CO2, GLUCOSE, BUN, CREATININE, CALCIUM in the last 72 hours.  Intake/Output Summary (Last 24 hours) at 10/27/2020 0924 Last data filed at 10/27/2020 0300 Gross per 24 hour  Intake 500 ml  Output 2400 ml  Net -1900 ml        Physical Exam: Vital Signs Blood pressure 138/73, pulse 71, temperature 98 F (36.7 C), temperature source Oral, resp. rate 16, height 5\' 11"  (1.803 m), SpO2 94 %. Constitutional: No distress . Vital signs reviewed. HENT: Normocephalic.  Atraumatic. Eyes: EOMI. No discharge. Cardiovascular: No JVD.  RRR. Respiratory: Normal effort.  No stridor.  Bilateral clear to auscultation. GI: Distended.  BS +. Skin: Warm and dry.   Right AKA with sutures CDI Psych: Normal mood.  Normal behavior. Musc:  Right AKA with edema and tenderness, improving  Neuro: Alert Motor: Bilateral upper extremities, left lower extremity: 5/5 proximal distal Right lower extremity: Hip flexion: 4/5 (pain inhibition), improving  Assessment/Plan: 1. Functional deficits which require 3+ hours per day of interdisciplinary therapy in a comprehensive inpatient rehab setting.  Physiatrist is providing close team supervision and 24 hour management of active medical problems listed below.  Physiatrist and rehab team continue to assess barriers to discharge/monitor patient progress toward functional and medical goals  Care Tool:  Bathing    Body parts bathed by patient: Right upper leg,Left upper leg,Face,Front perineal area,Right  arm,Left arm,Chest,Abdomen,Buttocks,Right lower leg   Body parts bathed by helper: Right lower leg,Buttocks Body parts n/a: Left lower leg   Bathing assist Assist Level: Supervision/Verbal cueing     Upper Body Dressing/Undressing Upper body dressing   What is the patient wearing?: Pull over shirt    Upper body assist Assist Level: Set up assist    Lower Body Dressing/Undressing Lower body dressing      What is the patient wearing?: Pants     Lower body assist Assist for lower body dressing: Supervision/Verbal cueing     Toileting Toileting    Toileting assist Assist for toileting: Minimal Assistance - Patient > 75%     Transfers Chair/bed transfer  Transfers assist     Chair/bed transfer assist level: Contact Guard/Touching assist     Locomotion Ambulation   Ambulation assist   Ambulation activity did not occur: Safety/medical concerns  Assist level: Contact Guard/Touching assist Assistive device: Walker-rolling Max distance: 170'   Walk 10 feet activity   Assist  Walk 10 feet activity did not occur: Safety/medical concerns  Assist level: Contact Guard/Touching assist Assistive device: Walker-rolling   Walk 50 feet activity   Assist Walk 50 feet with 2 turns activity did not occur: Safety/medical concerns  Assist level: Contact Guard/Touching assist Assistive device: Walker-rolling    Walk 150  feet activity   Assist Walk 150 feet activity did not occur: Safety/medical concerns  Assist level: Contact Guard/Touching assist Assistive device: Walker-rolling    Walk 10 feet on uneven surface  activity   Assist Walk 10 feet on uneven surfaces activity did not occur: Safety/medical concerns         Wheelchair     Assist Will patient use wheelchair at discharge?: Yes Type of Wheelchair: Manual Wheelchair activity did not occur: Safety/medical concerns  Wheelchair assist level: Independent Max wheelchair distance: 200     Wheelchair 50 feet with 2 turns activity    Assist    Wheelchair 50 feet with 2 turns activity did not occur: Safety/medical concerns   Assist Level: Independent   Wheelchair 150 feet activity     Assist  Wheelchair 150 feet activity did not occur: Safety/medical concerns   Assist Level: Independent   Blood pressure 138/73, pulse 71, temperature 98 F (36.7 C), temperature source Oral, resp. rate 16, height 5\' 11"  (1.803 m), SpO2 94 %.    Medical Problem List and Plan: 1.  Deficits with mobility, transfers, self-care secondary to right AKA due to necrotizing fasciitis  Continue CIR 2.  Antithrombotics: -DVT/anticoagulation:  Pharmaceutical: Continue Lovenox             -antiplatelet therapy: ASA 3. Pain Management: Oxycodone as needed. Added scheduled Tylenol q8H. Advised patient to take Oxycodone 30 minutes prior to AM therapy and he is agreeable.   Controlled with meds on 2/25             Monitor with increased exertion, particularly for neuropathic pain 4. Mood: Team to provide ego support.              -antipsychotic agents: N/A 5. Neuropsych: This patient is capable of making decisions on his own behalf. 6. Skin/Wound Care: Wound VAC DC'd              On vitamin C and Zinc to promote wound healing. 7. Fluids/Electrolytes/Nutrition: Monitor I/Os.             BMP within acceptable range on 2/21, labs ordered for Monday 8. HTN: Monitor BP tid and monitor for trends.              Off losartan at this time.    Controlled on 2/25             Moderate increased mobility 9. Acute blood loss anemia:              Hemoglobin 8.4 on 2/24  Received 2 units on 02/13  Received Feraheme, repeat dose on 2/25 10. Recent Covid: Encourage pulmonary hygiene             --Continue Tessalon perles tid. 11. Pre-diabetes: Hgb A1C-5.9. Continue Carb modified diet.   Controlled on 2/21             Monitor increase mobility 12. Hyponatremia: Continue salt tabs for now.              Sodium 135 on 2/21, labs ordered for Monday 13.  Drug-induced constipation:   Adjust bowel meds as necessary  Bowel meds increased again on 2/25  LOS: 7 days A FACE TO FACE EVALUATION WAS PERFORMED  [place] Lesure Lorie Phenix 10/27/2020, 9:24 AM

## 2020-10-27 NOTE — Progress Notes (Signed)
Physical Therapy Weekly Progress Note  Patient Details  Name: Andres [name] MRN: [id number] Date of Birth: May 12, 1956  Beginning of progress report period: October 21, 2020 End of progress report period: October 27, 2020  Today's Date: 10/27/2020 PT Individual Time: 1047-1200 PT Individual Time Calculation (min): 73 min   Patient has met 4 of 4 short term goals.  Patient progressing toward all goals.  Able to transfer consistently with CGA and to perform w/c mobility basically independent.  He has been able to ambulate up to 20' with RW and CGA.  Has initiated stair training due to not sure when worker's comp can install stair lift or some other adaptive plan for home entry.  Patient progressing well and added LTG for stairs.    Patient continues to demonstrate the following deficits muscle weakness, muscle joint tightness and pain and therefore will continue to benefit from skilled PT intervention to increase functional independence with mobility.  Patient progressing toward long term goals..  Continue plan of care.  PT Short Term Goals Week 1:  PT Short Term Goal 1 (Week 1): Pt will perform supine <>sit with supervision. PT Short Term Goal 1 - Progress (Week 1): Met PT Short Term Goal 2 (Week 1): Pt will perform bed <> w/c squat pivot transfer with CGA. PT Short Term Goal 3 (Week 1): Pt will perform STS at [place] with CGA. PT Short Term Goal 3 - Progress (Week 1): Met PT Short Term Goal 4 (Week 1): Pt will ambulate at least 5 feet using LRAD and Min A. PT Short Term Goal 4 - Progress (Week 1): Met PT Short Term Goal 5 (Week 1): Pt will propel w/c at least 75 feet with supervision. PT Short Term Goal 5 - Progress (Week 1): Met Week 2:  PT Short Term Goal 1 (Week 2): STG=LTG due to ELOS  Skilled Therapeutic Interventions/Progress Updates:  Patient up in w/c in room with IV team finishing placing new IV as pt reports his iron is low.  Patient propelled w/c x 200' independent.  In ortho  gym performed stand step transfer to car simulated at small SUV height with CGA to close S.  Patient negotiated ramp with RW and CGA.  Sit to supine/prone on mat with S and performed prolonged stretch to hip flexor on R, then contract-relax technique pt pushing into towel roll under thigh while in prone to stretch hip flexor.  Patient performed hip extension in prone x 10.  Back up to sitting with S.  Patient ambulated to w/c in front of BITS.  Performed standing balance/endurance with 1 UE support to tap targets first in random array, then touching letter, then number A-Z & 1-26.  CGA to Close S during balance activities.  Patient ambulated with RW x 60' negotiating cones and stepping over weight bar on the floor.  Patient propelled in w/c to room.  Left seated in w/c with all needs in reach and wife in the room who reports possibly will have worker's comp to the home over the weekend to determine needed changes for safety.  Ambulation/gait training;Balance/vestibular training;Community reintegration;Discharge planning;DME/adaptive equipment instruction;Functional mobility training;Neuromuscular re-education;Pain management;Patient/family education;Psychosocial support;Skin care/wound management;Splinting/orthotics;Stair training;Therapeutic Activities;Therapeutic Exercise;UE/LE Strength taining/ROM;UE/LE Coordination activities;Wheelchair propulsion/positioning   Therapy Documentation Precautions:  Precautions Precautions: Fall Precaution Comments: Restrictions Weight Bearing Restrictions: Yes RLE Weight Bearing: Non weight bearing Pain: Pain Assessment Pain Scale: 0-10 Pain Score: 5  Pain Type: Surgical pain Pain Location: Leg Pain Orientation: Right Pain Descriptors / Indicators: Aching Pain  Frequency: Constant Pain Onset: On-going Pain Intervention(s): Medication (See eMAR)   Therapy/Group: Individual Therapy  Reginia Naas  Magda Kiel, PT 10/27/2020, 11:16 AM

## 2020-10-27 NOTE — Plan of Care (Signed)
  Problem: RH Stairs Goal: LTG Patient will ambulate up and down stairs w/assist (PT) Description: LTG: Patient will ambulate up and down # of stairs with assistance (PT) 10/27/2020 1115 by Max Sane, PT Flowsheets (Taken 10/27/2020 1115) LTG: Pt will ambulate up/down stairs assist needed:: Set up assist 10/27/2020 1115 by Max Sane, PT Flowsheets (Taken 10/27/2020 1115) LTG: Pt will ambulate up/down stairs assist needed:: Set up assist LTG: Pt will  ambulate up and down number of stairs: 7  Added stair goal due to progress Magda Kiel, PT

## 2020-10-27 NOTE — Progress Notes (Signed)
Physical Therapy Session Note  Patient Details  Name: Andres [name] MRN: [id number] Date of Birth: Feb 16, 1956   Today's Date: 10/27/2020 PT Individual Time: 9728-2060 PT Individual Time Calculation (min): 43 min   Short Term Goals: Week 1:  PT Short Term Goal 1 (Week 1): Pt will perform supine <>sit with supervision. PT Short Term Goal 1 - Progress (Week 1): Met PT Short Term Goal 2 (Week 1): Pt will perform bed <> w/c squat pivot transfer with CGA. PT Short Term Goal 3 (Week 1): Pt will perform STS at [place] with CGA. PT Short Term Goal 3 - Progress (Week 1): Met PT Short Term Goal 4 (Week 1): Pt will ambulate at least 5 feet using LRAD and Min A. PT Short Term Goal 4 - Progress (Week 1): Met PT Short Term Goal 5 (Week 1): Pt will propel w/c at least 75 feet with supervision. PT Short Term Goal 5 - Progress (Week 1): Met  Skilled Therapeutic Interventions/Progress Updates:     Pt received supine in bed, awake and agreeable to therapy. Reports mild R AKA phantom pain but denies request for pain medication. Supine<>sit with supervision with HOB flat but use of bed rail. Donned L shoe with setupA. Squat<>pivot transfer with CGA from EOB to w/c - needed reminder for removing arm rest prior to transfer. Pt propelled himself in w/c mod I from his room to ADL apartment room. Performed furniture transfers on both recliner and soft sofa couch, CGA and RW with increased effort noted. Lengthy discussion held regarding amputation recovery. Education regarding conservative treatment for phantom pain (TENS, cryo, rubbing, etc), limb loss care (skin care, positioning, etc), contralateral limb care, shoe wear, role of outpatient therapies, support groups, and expectations for prosthetic's. All questions and concerns addressed - patient appreciative of feedback and education. Gait ~113ft with RW to main rehab gym (hop-to pattern). Performed repeated sit<>stands to RW with 5 second eccentric lowering to mat  table. Also performed forward/backward and lateral hopping over obstacle with CGA and RW. Propelled himself back to his room, ~170ft, mod I. Squat<>pivot with CGA back to bed but needed reminder for locking 1 of the brakes (he forgot). Able to laterally scoot along EOB with supervision and sit>supine with supervision without bed features. Ended session supine in bed with needs in reach, bed alarm on.  Therapy Documentation Precautions:  Precautions Precautions: Fall Precaution Comments: wound vac on R Restrictions Weight Bearing Restrictions: Yes RLE Weight Bearing: Non weight bearing  Therapy/Group: Individual Therapy  Jone Baseman Omarii Scalzo PT, DPT 10/27/2020, 3:29 PM

## 2020-10-28 LAB — OCCULT BLOOD X 1 CARD TO LAB, STOOL: Fecal Occult Bld: NEGATIVE

## 2020-10-28 MED ORDER — SORBITOL 70 % SOLN
45.0000 mL | Freq: Once | Status: AC
  Administered 2020-10-28: 45 mL via ORAL
  Filled 2020-10-28: qty 60

## 2020-10-28 NOTE — Progress Notes (Signed)
Physical Therapy Session Note  Patient Details  Name: Andres [name] MRN: [id number] Date of Birth: [birth year]  Today's Date: 10/28/2020 PT Individual Time: 1005-1059 PT Individual Time Calculation (min): 54 min   Short Term Goals: Week 2:  PT Short Term Goal 1 (Week 2): STG=LTG due to ELOS  Skilled Therapeutic Interventions/Progress Updates:   Pt received supine in bed and agreeable to PT. Supine>sit transfer with supervision assist and cues to decreaes use of bed features as able. Squat pivot transfer to [facility] with Supervision assist.   WC mobility through hall x 1100ft, 212ft, 1107ftx2 without cues or assist from PT. Pt also performed WC mobility through [facility] with supervision assist and only min cues for obstacle navigation and awareness of foot rest in turns.   nustep BUE/LLE activity tolerance training x 10 minutes, level 6, with cues for increased step per min >45. Pt rates Borg RPE 13/20 upon completion. Squat pivot transfer to and from [facility] with supervision assist.    Gait training with RW x 128ft and supervision assist. Min cues for decreased step length to improve safety.   RLE therex in standing at parallel bars. Hip extension, hip abduction, hip flexion each completed x 12 with cues for improve posture and decreased compensation from trunk.   Patient returned to room and left sitting in [facility] with call bell in reach and all needs met.         Therapy Documentation Precautions:  Precautions Precautions: Fall Precaution Comments: wound vac on R Restrictions Weight Bearing Restrictions: Yes RLE Weight Bearing: Non weight bearing Vital Signs:      Therapy/Group: Individual Therapy  Lorie Phenix 10/28/2020, 11:08 AM

## 2020-10-28 NOTE — Plan of Care (Signed)
  Problem: Consults Goal: RH LIMB LOSS PATIENT EDUCATION Description: Description: See Patient Education module for eduction specifics. Outcome: Progressing   Problem: RH SKIN INTEGRITY Goal: RH STG SKIN FREE OF INFECTION/BREAKDOWN Description: Skin remain free of infection and breakdown with min assist Outcome: Progressing Goal: RH STG ABLE TO PERFORM INCISION/WOUND CARE W/ASSISTANCE Description: STG Able To Perform Incision/Wound Care With Assistance. Min Outcome: Progressing   Problem: RH PAIN MANAGEMENT Goal: RH STG PAIN MANAGED AT OR BELOW PT'S PAIN GOAL Description: Pain less than 4 Outcome: Progressing   Problem: RH BOWEL ELIMINATION Goal: RH STG MANAGE BOWEL WITH ASSISTANCE Description: STG Manage Bowel with Assistance. Outcome: Progressing

## 2020-10-28 NOTE — Progress Notes (Signed)
PROGRESS NOTE   Subjective/Complaints:   Pt reports phantom pain bothering him, but nothing different than normal.  On gabapentin, but hasn't taken AM dose yet- thinks that's the reason.   Has a tiny spot on forehead- wants bandaid- was done before I left room- bandaid placed.    ROS:  Pt denies SOB, abd pain, CP, N/V/C/D, and vision changes   Objective:   No results found. Recent Labs    10/26/20 0513  WBC 4.6  HGB 8.4*  HCT 27.0*  PLT 424*   No results for input(s): NA, K, CL, CO2, GLUCOSE, BUN, CREATININE, CALCIUM in the last 72 hours.  Intake/Output Summary (Last 24 hours) at 10/28/2020 1614 Last data filed at 10/28/2020 1319 Gross per 24 hour  Intake 1072 ml  Output 3375 ml  Net -2303 ml        Physical Exam: Vital Signs Blood pressure 131/78, pulse 74, temperature 98 F (36.7 C), temperature source Oral, resp. rate 18, height 5\' 11"  (1.803 m), SpO2 95 %.   General: awake, alert, appropriate, NAD- sitting up in bed HENT: conjugate gaze; oropharynx moist- has tiny scab on R forehead CV: regular rate; no JVD Pulmonary: CTA B/L; no W/R/R- good air movement GI: soft, NT, (+)BS- hypoactive; protuberant vs distended Psychiatric: appropriate Neurological: Ox3    Right AKA with sutures CDI. Musc:  Right AKA with edema and tenderness, improving  Neuro: Alert Motor: Bilateral upper extremities, left lower extremity: 5/5 proximal distal Right lower extremity: Hip flexion: 4/5 (pain inhibition), improving  Assessment/Plan: 1. Functional deficits which require 3+ hours per day of interdisciplinary therapy in a comprehensive inpatient rehab setting.  Physiatrist is providing close team supervision and 24 hour management of active medical problems listed below.  Physiatrist and rehab team continue to assess barriers to discharge/monitor patient progress toward functional and medical goals  Care  Tool:  Bathing    Body parts bathed by patient: Right upper leg,Left upper leg,Face,Front perineal area,Right arm,Left arm,Chest,Abdomen,Buttocks,Right lower leg   Body parts bathed by helper: Right lower leg,Buttocks Body parts n/a: Left lower leg   Bathing assist Assist Level: Supervision/Verbal cueing     Upper Body Dressing/Undressing Upper body dressing   What is the patient wearing?: Pull over shirt    Upper body assist Assist Level: Set up assist    Lower Body Dressing/Undressing Lower body dressing      What is the patient wearing?: Pants     Lower body assist Assist for lower body dressing: Supervision/Verbal cueing     Toileting Toileting    Toileting assist Assist for toileting: Minimal Assistance - Patient > 75%     Transfers Chair/bed transfer  Transfers assist     Chair/bed transfer assist level: Supervision/Verbal cueing     Locomotion Ambulation   Ambulation assist   Ambulation activity did not occur: Safety/medical concerns  Assist level: Contact Guard/Touching assist Assistive device: Walker-rolling Max distance: 80'   Walk 10 feet activity   Assist  Walk 10 feet activity did not occur: Safety/medical concerns  Assist level: Contact Guard/Touching assist Assistive device: Walker-rolling   Walk 50 feet activity   Assist Walk 50 feet with 2 turns  activity did not occur: Safety/medical concerns  Assist level: Contact Guard/Touching assist Assistive device: Walker-rolling    Walk 150 feet activity   Assist Walk 150 feet activity did not occur: Safety/medical concerns  Assist level: Contact Guard/Touching assist Assistive device: Walker-rolling    Walk 10 feet on uneven surface  activity   Assist Walk 10 feet on uneven surfaces activity did not occur: Safety/medical concerns         Wheelchair     Assist Will patient use wheelchair at discharge?: Yes Type of Wheelchair: Manual Wheelchair activity did not  occur: Safety/medical concerns  Wheelchair assist level: Independent Max wheelchair distance: 200    Wheelchair 50 feet with 2 turns activity    Assist    Wheelchair 50 feet with 2 turns activity did not occur: Safety/medical concerns   Assist Level: Independent   Wheelchair 150 feet activity     Assist  Wheelchair 150 feet activity did not occur: Safety/medical concerns   Assist Level: Independent   Blood pressure 131/78, pulse 74, temperature 98 F (36.7 C), temperature source Oral, resp. rate 18, height 5\' 11"  (1.803 m), SpO2 95 %.    Medical Problem List and Plan: 1.  Deficits with mobility, transfers, self-care secondary to right AKA due to necrotizing fasciitis  Continue CIR 2.  Antithrombotics: -DVT/anticoagulation:  Pharmaceutical: Continue Lovenox             -antiplatelet therapy: ASA 3. Pain Management: Oxycodone as needed. Added scheduled Tylenol q8H. Advised patient to take Oxycodone 30 minutes prior to AM therapy and he is agreeable.   2/26- Pain is controlled as long as gets meds on time, esp gabapentin dose- will d/w nursing and con't meds             Monitor with increased exertion, particularly for neuropathic pain 4. Mood: Team to provide ego support.              -antipsychotic agents: N/A 5. Neuropsych: This patient is capable of making decisions on his own behalf. 6. Skin/Wound Care: Wound VAC DC'd              On vitamin C and Zinc to promote wound healing. 7. Fluids/Electrolytes/Nutrition: Monitor I/Os.             BMP within acceptable range on 2/21, labs ordered for Monday 8. HTN: Monitor BP tid and monitor for trends.              Off losartan at this time.    2/26- BP 130s/70s- con't regimen             Moderate increased mobility 9. Acute blood loss anemia:              Hemoglobin 8.4 on 2/24  Received 2 units on 02/13  Received Feraheme, repeat dose on 2/25 10. Recent Covid: Encourage pulmonary hygiene             --Continue  Tessalon perles tid. 11. Pre-diabetes: Hgb A1C-5.9. Continue Carb modified diet.   Controlled on 2/21             Monitor increase mobility 12. Hyponatremia: Continue salt tabs for now.             Sodium 135 on 2/21, labs ordered for Monday 13.  Drug-induced constipation:   Adjust bowel meds as necessary  Bowel meds increased again on 2/25  2/26- gave Sorbitol 45g to hope pt will go- LBM 2/23 LOS: 8 days A  FACE TO FACE EVALUATION WAS PERFORMED  Brinklee Cisse 10/28/2020, 4:14 PM

## 2020-10-29 LAB — OCCULT BLOOD X 1 CARD TO LAB, STOOL: Fecal Occult Bld: NEGATIVE

## 2020-10-30 ENCOUNTER — Encounter (HOSPITAL_COMMUNITY): Payer: Self-pay | Admitting: Physical Medicine & Rehabilitation

## 2020-10-30 DIAGNOSIS — F329 Major depressive disorder, single episode, unspecified: Secondary | ICD-10-CM

## 2020-10-30 DIAGNOSIS — I10 Essential (primary) hypertension: Secondary | ICD-10-CM

## 2020-10-30 LAB — CBC
HCT: 29.6 % — ABNORMAL LOW (ref 39.0–52.0)
Hemoglobin: 9.2 g/dL — ABNORMAL LOW (ref 13.0–17.0)
MCH: 28.5 pg (ref 26.0–34.0)
MCHC: 31.1 g/dL (ref 30.0–36.0)
MCV: 91.6 fL (ref 80.0–100.0)
Platelets: 314 10*3/uL (ref 150–400)
RBC: 3.23 MIL/uL — ABNORMAL LOW (ref 4.22–5.81)
RDW: 20.1 % — ABNORMAL HIGH (ref 11.5–15.5)
WBC: 4.1 10*3/uL (ref 4.0–10.5)
nRBC: 0 % (ref 0.0–0.2)

## 2020-10-30 LAB — BASIC METABOLIC PANEL
Anion gap: 10 (ref 5–15)
BUN: 19 mg/dL (ref 8–23)
CO2: 22 mmol/L (ref 22–32)
Calcium: 9 mg/dL (ref 8.9–10.3)
Chloride: 105 mmol/L (ref 98–111)
Creatinine, Ser: 0.77 mg/dL (ref 0.61–1.24)
GFR, Estimated: >60 mL/min (ref >60)
Glucose, Bld: 95 mg/dL (ref 70–99)
Potassium: 3.7 mmol/L (ref 3.5–5.1)
Sodium: 137 mmol/L (ref 135–145)

## 2020-10-30 LAB — GLUCOSE, CAPILLARY: Glucose-Capillary: 108 mg/dL — ABNORMAL HIGH (ref 70–99)

## 2020-10-30 MED ORDER — FLUOXETINE HCL 10 MG PO CAPS
10.0000 mg | ORAL_CAPSULE | Freq: Every day | ORAL | Status: DC
  Administered 2020-10-30 – 2020-11-03 (×5): 10 mg via ORAL
  Filled 2020-10-30 (×5): qty 1

## 2020-10-30 NOTE — Progress Notes (Signed)
Occupational Therapy Session Note  Patient Details  Name: Andres [name] MRN: [id number] Date of Birth: [birth year]  Today's Date: 10/30/2020 OT Individual Time: 1100-1202   &   1308-6578 OT Individual Time Calculation (min): 62 min    &   38 min   Short Term Goals: Week 1:  OT Short Term Goal 1 (Week 1): Pt will complete BSC transfers with CGA or less. OT Short Term Goal 2 (Week 1): Pt will be able to doff pants over hips on BSC with min A. OT Short Term Goal 3 (Week 1): Pt will be able to pull pants over hips with min -mod A on BSC. OT Short Term Goal 4 (Week 1): Pt will be able to sit to stand with RW with mod A. OT Short Term Goal 5 (Week 1): Pt will be able to tolerate standing with RW for 1 minute.  Skilled Therapeutic Interventions/Progress Updates:    AM session:   Patient seated in w/c, denies pain at this time but states that he is a little down today - MD aware and referred to neuro-psych.  He agrees to try shower this session.  He is able to gather supplies needed for shower both w/c level (mod I) and via RW with CS.  Ambulation with RW to/from shower bench and w/c with CS.  He is able to doff clothing mod I.  Reviewed use of bag to cover leg at this time - he demonstrates good understanding.   Shower completed seated on shower bench with hand held shower head DS.  Dressing completed seated on shower bench with CS.  Reviewed home set up and adl process / energy conservation opportunities with good understanding.  Sit pivot transfer to bed with CS.  Bed mobility mod I.  He remained in bed at close of session, bed alarm set and call bell in hand.     PM session:   Patient in bed, ready for afternoon session.  Sit to stand from bed surface and SPT with RW to/from bed , w/c with CS min cues for safety and hand placement with walker.  He is able to propel w/c to and from therapy treatment space.  Reviewed and practiced reach and transport of items in kitchen environment both w/c and  walker level - he demonstrates good understanding - he is mod I w/c level with increased time and supervision level when using walker for safety.  Reviewed options for loading dishwasher.  Provided inspection mirror for use in skin/limb inspection as well as functional use in kitchen situations.  He remained seated in w/c at close of session, wife present in room - reviewed progress to date.  Call bell and items needed in reach.      Therapy Documentation Precautions:  Precautions Precautions: Fall Precaution Comments: wound vac on R Restrictions Weight Bearing Restrictions: No RLE Weight Bearing: Non weight bearing   Therapy/Group: Individual Therapy  Carlos Levering 10/30/2020, 7:41 AM

## 2020-10-30 NOTE — Progress Notes (Signed)
Occupational Therapy Session Note  Patient Details  Name: Andres [name] MRN: [id number] Date of Birth: 03/27/1956  Today's Date: 10/30/2020 OT Individual Time: 1330-1400 OT Individual Time Calculation (min): 30 min    Short Term Goals: Week 1:  OT Short Term Goal 1 (Week 1): Pt will complete BSC transfers with CGA or less. OT Short Term Goal 2 (Week 1): Pt will be able to doff pants over hips on BSC with min A. OT Short Term Goal 3 (Week 1): Pt will be able to pull pants over hips with min -mod A on BSC. OT Short Term Goal 4 (Week 1): Pt will be able to sit to stand with RW with mod A. OT Short Term Goal 5 (Week 1): Pt will be able to tolerate standing with RW for 1 minute.  Skilled Therapeutic Interventions/Progress Updates:    Pt supine in bed agreeable to OT session. Reporting phantom limb pain RLE nearing end of session.  Pt completed supine to sit with mod I and squat pivot EOB to w/c with supervision.  Pt self propelled using BUE to ortho gym, approximately 75 feet with mod I.  Completed UB ergometer at level 6 x 6 minutes forward and 6 minutes backwards to facilitate increased strength and endurance for ease during ADLs.  Pt instructed through supraspinatus 2 x 15 reps using 5 lb free weights bilateral shoulders for strengthening and prevention of shoulder impingement. Discussed techniques for phantom limb pain management  Pt self propelled back to room with mod I.  Squat pivot w/c to EOB with supervision. Sit to supine with mod I.  Call bell in reach, bed alarm on.  Therapy Documentation Precautions:  Precautions Precautions: Fall Precaution Comments: wound vac on R Restrictions Weight Bearing Restrictions: No RLE Weight Bearing: Non weight bearing   Therapy/Group: Individual Therapy  Ezekiel Slocumb 10/30/2020, 3:41 PM

## 2020-10-30 NOTE — Progress Notes (Signed)
PROGRESS NOTE   Subjective/Complaints: Patient seen laying in bed this morning.  He states he did not sleep well overnight.  He notes depressive thoughts.  He states he had a good weekend.  He states he would like to go home soon as possible.  Discussed home access and patient knows he is working on stairs and having someone come to his house from [employer] for replacement today.  ROS: Denies CP, SOB, N/V/D  Objective:   No results found. Recent Labs    10/30/20 0515  WBC 4.1  HGB 9.2*  HCT 29.6*  PLT 314   Recent Labs    10/30/20 0515  NA 137  K 3.7  CL 105  CO2 22  GLUCOSE 95  BUN 19  CREATININE 0.77  CALCIUM 9.0    Intake/Output Summary (Last 24 hours) at 10/30/2020 1237 Last data filed at 10/30/2020 0535 Gross per 24 hour  Intake 660 ml  Output 1085 ml  Net -425 ml        Physical Exam: Vital Signs Blood pressure (!) 147/82, pulse 74, temperature 98.2 F (36.8 C), temperature source Oral, resp. rate 18, height 5\' 11"  (1.803 m), SpO2 96 %. Constitutional: No distress . Vital signs reviewed. HENT: Normocephalic.  Atraumatic. Eyes: EOMI. No discharge. Cardiovascular: No JVD.  RRR. Respiratory: Normal effort.  No stridor.  Bilateral clear to auscultation. GI: Non-distended.  BS +. Skin: Warm and dry.   Right AKA with dressing CDI Psych: Slightly flat today.  Normal behavior. Musc:  Right AKA with edema and tenderness, improving Neuro: Alert Motor: Bilateral upper extremities, left lower extremity: 5/5 proximal distal, unchanged Right lower extremity: Hip flexion: 4/5 (pain inhibition), improving  Assessment/Plan: 1. Functional deficits which require 3+ hours per day of interdisciplinary therapy in a comprehensive inpatient rehab setting.  Physiatrist is providing close team supervision and 24 hour management of active medical problems listed below.  Physiatrist and rehab team continue  to assess barriers to discharge/monitor patient progress toward functional and medical goals  Care Tool:  Bathing    Body parts bathed by patient: Right upper leg,Left upper leg,Face,Front perineal area,Right arm,Left arm,Chest,Abdomen,Buttocks,Right lower leg   Body parts bathed by helper: Right lower leg,Buttocks Body parts n/a: Left lower leg   Bathing assist Assist Level: Supervision/Verbal cueing     Upper Body Dressing/Undressing Upper body dressing   What is the patient wearing?: Pull over shirt    Upper body assist Assist Level: Set up assist    Lower Body Dressing/Undressing Lower body dressing      What is the patient wearing?: Pants     Lower body assist Assist for lower body dressing: Supervision/Verbal cueing     Toileting Toileting    Toileting assist Assist for toileting: Minimal Assistance - Patient > 75%     Transfers Chair/bed transfer  Transfers assist     Chair/bed transfer assist level: Supervision/Verbal cueing     Locomotion Ambulation   Ambulation assist   Ambulation activity did not occur: Safety/medical concerns  Assist level: Contact Guard/Touching assist Assistive device: Walker-rolling Max distance: 80'   Walk 10 feet activity   Assist  Walk 10 feet activity did  not occur: Safety/medical concerns  Assist level: Contact Guard/Touching assist Assistive device: Walker-rolling   Walk 50 feet activity   Assist Walk 50 feet with 2 turns activity did not occur: Safety/medical concerns  Assist level: Contact Guard/Touching assist Assistive device: Walker-rolling    Walk 150 feet activity   Assist Walk 150 feet activity did not occur: Safety/medical concerns  Assist level: Contact Guard/Touching assist Assistive device: Walker-rolling    Walk 10 feet on uneven surface  activity   Assist Walk 10 feet on uneven surfaces activity did not occur: Safety/medical concerns         Wheelchair     Assist Will  patient use wheelchair at discharge?: Yes Type of Wheelchair: Manual Wheelchair activity did not occur: Safety/medical concerns  Wheelchair assist level: Independent Max wheelchair distance: 200    Wheelchair 50 feet with 2 turns activity    Assist    Wheelchair 50 feet with 2 turns activity did not occur: Safety/medical concerns   Assist Level: Independent   Wheelchair 150 feet activity     Assist  Wheelchair 150 feet activity did not occur: Safety/medical concerns   Assist Level: Independent     Medical Problem List and Plan: 1.  Deficits with mobility, transfers, self-care secondary to right AKA due to necrotizing fasciitis  Continue CIR 2.  Antithrombotics: -DVT/anticoagulation:  Pharmaceutical: Continue Lovenox             -antiplatelet therapy: ASA 3. Pain Management: Oxycodone as needed. Added scheduled Tylenol q8H. Advised patient to take Oxycodone 30 minutes prior to AM therapy and he is agreeable.   Controlled with meds on 2/28             Monitor with increased exertion, particularly for neuropathic pain 4. Mood: Team to provide ego support.   Fluoxetine started on 2/28 per patient preference             -antipsychotic agents: N/A 5. Neuropsych: This patient is capable of making decisions on his own behalf. 6. Skin/Wound Care: Wound VAC DC'd              On vitamin C and Zinc to promote wound healing. 7. Fluids/Electrolytes/Nutrition: Monitor I/Os.             BMP within acceptable range on 2/28 8. HTN: Monitor BP tid and monitor for trends.              Off losartan at this time.    Mildly elevated on 2/28, will plan to restart losartan tomorrow if persistent             Moderate increased mobility 9. Acute blood loss anemia:              Hemoglobin 9.2 on 2/28  Hemoccult negative  Received 2 units on 02/13  Received Feraheme, repeat dose on 2/25 10. Recent Covid: Encourage pulmonary hygiene             --Continue Tessalon perles tid. 11.  Pre-diabetes: Hgb A1C-5.9. Continue Carb modified diet.   Controlled on 2/28             Monitor increase mobility 12. Hyponatremia: Continue salt tabs for now.             Sodium 137 on 2/28 13.  Drug-induced constipation:   Adjust bowel meds as necessary  Bowel meds increased again on 2/25  Improving  LOS: 10 days A FACE TO FACE EVALUATION WAS PERFORMED  Jakaylee Sasaki Lorie Phenix 10/30/2020,  12:37 PM

## 2020-10-30 NOTE — Progress Notes (Addendum)
Patient ID: Andres [name], male   DOB: November 13, 1955, 65 y.o.   MRN: [id number] Met with pt who reports OT at his home assessing accessibility and needs at home-ie ramp. He does plan to stay on first floor until can get up the flight of stairs or workers comp places a stair lift in. If ramp needed in front of home unsure if will be completed by 3/2. Will know more once evaluation is complete. Will get updated tomorrow. Workers Comp CM has prescriptions for equipment needed and follow up therapies.

## 2020-10-30 NOTE — Progress Notes (Signed)
Physical Therapy Session Note  Patient Details  Name: Andres [name] MRN: [id number] Date of Birth: 10-08-1955  Today's Date: 10/30/2020 PT Individual Time: 0916-1030 PT Individual Time Calculation (min): 74 min   Short Term Goals: Week 2:  PT Short Term Goal 1 (Week 2): STG=LTG due to ELOS  Skilled Therapeutic Interventions/Progress Updates:    Patient in supine reports feeling sorry for himself this morning, but ready to go.  Performed supine to sit with S and seated to don shoe.  Patient sit to stand and step to w/c with RW and CGA.  Propelled in w/c to therapy gym independent including w/c set up.  Patient sit to stand to RW with CGA and ambulated to stairs with S.  Negotiated 4 (6") steps with rail on L and crutch on R with min A.  Patient ambulated x 40' to mat after stairs without rest.  Patient sit to supine independent.  Supine to therex including isometric hip ext w/ 5 sec hold x 10, bridging on L lifting R x 10, rolled to prone for prone lying to stretch R hip x 2 minutes (notified RN pt in need of pain meds.)  Patient in prone for R hip extension x 10. L hip extension and knee flexion x 10 each.  Rolled to L sidelying to perform R hip abduction x 10.  Supine to sit with S cues for ensuring pt uses most energy conserving method.  Patient sit to stand to RW with s.  Ambulated x 90' with RW and CGA.  Seated rest and RN in to deliver meds.  Performed another ambulation trial with CGA with RW x 130' .  Patient propelled in w/c to dayroom.  Performed standing balance/endurance activity while alternating UE support to play block stacking game.  Patient stood for approximately 5 minutes.  Patient propelled in w/c to room independently.  Left up in chair with call bell and needs in reach.   Therapy Documentation Precautions:  Precautions Precautions: Fall Precaution Comments: wound vac on R Restrictions Weight Bearing Restrictions: No RLE Weight Bearing: Non weight bearing Pain: Pain  Assessment Pain Score: 7  Pain Type: Phantom pain Pain Location: Leg Pain Orientation: Right Pain Descriptors / Indicators: Grimacing;Guarding;Sharp Pain Onset: On-going Pain Intervention(s): RN made aware;Cutaneous stimulation;Distraction    Therapy/Group: Individual Therapy  Reginia Naas  Vanoss, PT 10/30/2020, 12:20 PM

## 2020-10-30 NOTE — Consult Note (Signed)
Neuropsychological Consultation   Patient:   Andres [name]   DOB:   [birth year]  MR Number:  [id number]  Location:  [facility] [address] CENTER [address] [place] [id number] [place] [address] Dept: [place]: [phone]           Date of Service:   10/30/2020  Start Time:   8:45 AM End Time:   9:15 AM  Provider/Observer:  Ilean Skill, Psy.D.       Clinical Neuropsychologist       Billing Code/Service: 78242  Chief Complaint:    Andres [name] is a 65 year old male with history of hypertension, BCC.  Patient was admitted on 10/05/2020 with right lower extremity edema due to cellulitis.  Patient was found to be Covid positive but asymptomatic and treated.  MRI was ordered and patient showing progressive changes due to infection for progressive for leukocytosis.  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  Wound culture positive for rare MRSA and BC.  Patient with postoperative pain and initial interactions for poor safety awareness and limitations in self-care requiring CIR due to functional decline.  Reason for Service:  Patient was referred for neuropsychological consultation due to coping and adjustment post AKA due to necrotizing fasciitis.  Below is the HPI for the current admission.  HPI:  Andres [name] is a 65 year old male with history of HTN, BCC who was admitted on 10/05/2020 with right lower extremity edema and erythema due to cellulitis.  History taken from chart review and patient.  He also found to be Covid positive but as asymptomatic was treated with remdesivir as well as Rocephin/daptomycin.  MRI of the right lower extremity ordered due to progressive leukocytosis and showed mild ascites of anterior tibialis and soleus muscle. Dr. 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  Wound culture positive for rare MRSA and BC with 1/2 positive for Corynebacterium felt to be contaminant. Has completed 2 week regimen of rocephin/daptomycin and gabapentin was added for management of neuropathy.  Further complicated by postoperative pain and acute blood loss anemia requiring 2 units PRBC. Pre-renal azotemia resolved with IVF for hydration. Therapy ongoing and patient continues to require encouragement with increased time and effort for mobility, has poor safety awareness, limitations in self-care.  As well as fear affecting mobility and ADLs. CIR recommended due to functional decline.  Please see preadmission assessment from earlier today as well.  Current Status:  The patient was laying in his bed in the slightly elevated position when he came in today.  Due to scheduling event my appointment with him was not formally put into his schedule today as it should have been.  The patient was not aware that I was coming in but he was awake and alert.  The patient acknowledged a very difficult time coping with sadness of loss of his leg.  The patient reports that he had not expected anything like this to happen as he had been at work and suffered a scratch in his leg which ultimately developed into necrotizing fasciitis.  Patient reports that he has had fears about other types of infection with skin scratches since this.  Patient is dealing with AKA at this point  with some degree of phantom limb pain which he describes as acute and severe at times.  Patient had very strong emotional responses when talking about coping with the loss of his right leg and fears about not being able to return to work or manage his life properly.  Behavioral Observation: Andres [name]  presents as a 65 y.o.-year-old Right Caucasian Male  who appeared his stated age. his dress was Appropriate and he was Well Groomed and his manners were Appropriate to the situation.  his participation was indicative of Appropriate and Attentive behaviors.  There were physical disabilities noted.  he displayed an appropriate level of cooperation and motivation.     Interactions:    Active Appropriate and Attentive  Attention:   within normal limits and attention span and concentration were age appropriate  Memory:   within normal limits; recent and remote memory intact  Visuo-spatial:  not examined  Speech (Volume):  normal  Speech:   normal; normal  Thought Process:  Coherent and Relevant  Though Content:  WNL; not suicidal and not homicidal  Orientation:   person, place, time/date and situation  Judgment:   Good  Planning:   Fair  Affect:    Anxious and Depressed  Mood:    Dysphoric  Insight:   Good  Intelligence:   normal  Medical History:   Past Medical History:  Diagnosis Date  . Basal cell carcinoma   . Hypertension          Patient Active Problem List   Diagnosis Date Noted  . History of hypertension   . Drug induced constipation   . Postoperative pain   . Post-operative pain   . 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[place]) 10/20/2020  . Unilateral AKA, right ([place])   . Acute blood loss anemia   . Prediabetes   . Necrotizing fasciitis of lower leg ([place])   . Abscess of right lower leg   . Cellulitis 10/05/2020  . Cellulitis of right lower extremity 10/05/2020  . COVID-19 virus infection 10/05/2020  . COVID   . Hyponatremia   . Chest pain 11/08/2018  . Accelerated hypertension 11/08/2018  . Encounter to establish care 07/09/2017  . Essential hypertension 08/16/2015  . Sensorineural hearing loss (SNHL), bilateral 08/16/2015  . Impaired fasting glucose 08/23/2014  . Obesity (BMI 30-39.9) 08/23/2014  . History of nonmelanoma skin cancer 07/15/2012  . Allergic rhinitis 10/24/2011  . Basal cell  carcinoma of back 07/10/2011   Psychiatric History:  No prior psychiatric history noted.  Family Med/Psych History:  Family History  Family history unknown: Yes    Risk of Suicide/Violence: low patient denies any suicidal or homicidal ideation.  Impression/DX:  Andres [name] is a 65 year old male with history of hypertension, BCC.  Patient was admitted on 10/05/2020 with right lower extremity edema due to cellulitis.  Patient was found to be Covid positive but asymptomatic and treated.  MRI was ordered and patient showing progressive changes due to infection for progressive for leukocytosis.  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  Wound culture positive for rare MRSA and BC.  Patient with postoperative pain and initial interactions for poor safety awareness and limitations in self-care requiring CIR due to functional decline.  The patient was laying in his bed in the slightly elevated position when he came in today.  Due to scheduling event my appointment with him was not formally put into his schedule today as  it should have been.  The patient was not aware that I was coming in but he was awake and alert.  The patient acknowledged a very difficult time coping with sadness of loss of his leg.  The patient reports that he had not expected anything like this to happen as he had been at work and suffered a scratch in his leg which ultimately developed into necrotizing fasciitis.  Patient reports that he has had fears about other types of infection with skin scratches since this.  Patient is dealing with AKA at this point with some degree of phantom limb pain which he describes as acute and severe at times.  Patient had very strong emotional responses when talking about coping with the loss of his right leg and fears about not being able to return to work or manage his life properly.  Disposition/Plan:  Today we worked  on coping and adjustment issues around the loss of his leg due to necrotizing fasciitis.  The patient is having a strong negative emotional response which for the most part is very appropriate to situation.  The patient reports that his level of negative emotional responses not raising to the level that is keeping him from actively work on therapeutic interventions.  Also informed patient that if depressive symptoms worsen to the point that they are negatively impacting his coping and adjustment issues to inform Dr. Posey Pronto during follow-up outpatient visits.  I will also talk to Dr. Patel/PA about potential initiation of SSRI medication if warranted.  Diagnosis:    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[place]) - Plan: Ambulatory referral to Physical Medicine Rehab         Electronically Signed   _______________________ Ilean Skill, Psy.D. Clinical Neuropsychologist

## 2020-10-31 ENCOUNTER — Telehealth: Payer: Self-pay | Admitting: Medical

## 2020-10-31 LAB — OCCULT BLOOD X 1 CARD TO LAB, STOOL: Fecal Occult Bld: NEGATIVE

## 2020-10-31 NOTE — Telephone Encounter (Signed)
Opened to review 

## 2020-10-31 NOTE — Progress Notes (Addendum)
Patient ID: Andres [name], male   DOB: Nov 06, 1955, 65 y.o.   MRN: [id number]  Spoke with Angie-Workers Comp CM who reports the equipment is being delivered tomorrow, but wife and pt just agreed to ramp today and is not sure how long this will take to put up. It is a 70 foot ramp. She wants him to stay until the ramp is completed and feels it would not be safe for him to go up stairs with a crutch or non-emergency ambulance. Discussed having a aide at home to assist him and his wife on his care at home. He will think about this, but feels what for?  Workers Comp setting up home health therapies. Will work toward discharge 3/3, but need to see if safe to negotiate steps with a crutch and son in-law assisting.  3:35 PM Pt aware Workers Comp CM does not want him to try to go up stairs with crutch and would like the ramp to be in place when discharges. Unsure who long that will take though. Will see tomorrow and go from there. Aware will be MD's decision and pt can not just stay here. Workers Comp may need to ambulance pt home until ramp completed.

## 2020-10-31 NOTE — Progress Notes (Signed)
10/31/20 1521  What Happened  Was fall witnessed? No  Was patient injured? No  Patient found in bathroom  Found by Staff-comment Andres [name] PT)  Stated prior activity other (comment) (pt assisted to bathroom by staff and was instructed to call when done)  Follow Up  MD notified Pam, PA  Time MD notified [phone]  Family notified Yes - comment Andres [name]- spouse)  Time family notified 1532  Additional tests No  Simple treatment Ice (painmed administered)  Progress note created (see row info) Yes  Adult Fall Risk Assessment  Risk Factor Category (scoring not indicated) Fall has occurred during this admission (document High fall risk);High fall risk per protocol (document High fall risk)  Age 65  Fall History: Fall within 6 months prior to admission 0  Elimination; Bowel and/or Urine Incontinence 0  Elimination; Bowel and/or Urine Urgency/Frequency 0  Medications: includes PCA/Opiates, Anti-convulsants, Anti-hypertensives, Diuretics, Hypnotics, Laxatives, Sedatives, and Psychotropics 5  Patient Care Equipment 0  Mobility-Assistance 2  Mobility-Gait 2  Mobility-Sensory Deficit 0  Altered awareness of immediate physical environment 0  Impulsiveness 0  Lack of understanding of one's physical/cognitive limitations 0  Total Score 10  Patient Fall Risk Level High fall risk  Adult Fall Risk Interventions  Required Bundle Interventions *See Row Information* High fall risk - low, moderate, and high requirements implemented  Additional Interventions Use of appropriate toileting equipment (bedpan, BSC, etc.);Lap belt while in chair/wheelchair (Rehab only)  Screening for Fall Injury Risk (To be completed on HIGH fall risk patients) - Assessing Need for Floor Mats  Risk For Fall Injury- Criteria for Floor Mats Previous fall this admission  Vitals  Temp 98.3 F (36.8 C)  Temp Source Oral  BP 130/81  MAP (mmHg) 96  BP Location Left Arm  BP Method Automatic  Patient Position (if  appropriate) Lying  Pulse Rate 78  Resp 18  Oxygen Therapy  SpO2 100 %  O2 Device Room Air  Pain Assessment  Pain Scale 0-10  Pain Score 7  Faces Pain Scale 4  Pain Type Acute pain;Surgical pain  Pain Location Leg  Pain Orientation Right  Pain Descriptors / Indicators Aching;Other (Comment) (phantom pain)  Pain Frequency Intermittent  Pain Onset On-going  Patients Stated Pain Goal 3  Pain Intervention(s) Medication (See eMAR);Cold applied (oxycodone given)  Multiple Pain Sites No  PCA/Epidural/Spinal Assessment  Respiratory Pattern Regular;Unlabored  Neurological  Neuro (WDL) WDL  Level of Consciousness Alert  Orientation Level Oriented X4  Cognition Appropriate at baseline  Speech Clear  R Hand Grip Strong  L Hand Grip Strong  RUE Motor Response Purposeful movement  RUE Sensation Full sensation  RUE Motor Strength 5  LUE Motor Response Purposeful movement  LUE Sensation Full sensation  LUE Motor Strength 5  RLE Motor Response Purposeful movement  RLE Sensation Pain  RLE Motor Strength 3 (AKA)  LLE Motor Response Purposeful movement  LLE Sensation Full sensation  LLE Motor Strength 4  Neuro Symptoms None  Musculoskeletal  Musculoskeletal (WDL) X  Assistive Device Front wheel walker  Generalized Weakness Yes  Weight Bearing Restrictions Yes  RLE Weight Bearing NWB  Integumentary  Integumentary (WDL) X  Skin Color Appropriate for ethnicity  Skin Condition Dry  Skin Integrity Surgical Incision (see LDA);Amputation  Amputation Location Leg  Amputation Location Orientation Right  Amputation Intervention Other (Comment) (Stump shrinker)    Pt assisted to toilet around 1515. Pt requested privacy to have BM. Instructed pt to call when done and exited the  room. At 1518 PT stated hearing a noise walked into room and found pt on the bathroom floor. Pt indicated he was trying to get up. Pt assisted on to [facility] and then to bed. Denies hitting head. No injury to stump  site. Vital signs stable. Pain med given for comfort. Ice pack applied to stump. Pam, PA made aware. Family is notified. Pt currently resting in bed with all needs within reach.   Andres Stabs, RN

## 2020-10-31 NOTE — Progress Notes (Signed)
Occupational Therapy Weekly Progress Note  Patient Details  Name: Andres [name] MRN: [id number] Date of Birth: 08-11-1956  Beginning of progress report period: October 21, 2020 End of progress report period: October 31, 2020     Patient has met 5 of 5 short term goals.  Patient making gains in all functional areas and preparing for discharge to home with wife.  Patient continues to demonstrate the following deficits: muscle weakness, decreased cardiorespiratoy endurance and decreased standing balance and decreased postural control and therefore will continue to benefit from skilled OT intervention to enhance overall performance with BADL, iADL, Vocation and Reduce care partner burden.  Patient progressing toward long term goals..  Continue plan of care.  OT Short Term Goals Week 1:  OT Short Term Goal 1 (Week 1): Pt will complete BSC transfers with CGA or less. OT Short Term Goal 1 - Progress (Week 1): Met OT Short Term Goal 2 (Week 1): Pt will be able to doff pants over hips on BSC with min A. OT Short Term Goal 2 - Progress (Week 1): Met OT Short Term Goal 3 (Week 1): Pt will be able to pull pants over hips with min -mod A on BSC. OT Short Term Goal 3 - Progress (Week 1): Met OT Short Term Goal 4 (Week 1): Pt will be able to sit to stand with RW with mod A. OT Short Term Goal 4 - Progress (Week 1): Met OT Short Term Goal 5 (Week 1): Pt will be able to tolerate standing with RW for 1 minute. OT Short Term Goal 5 - Progress (Week 1): Met Week 2:  OT Short Term Goal 1 (Week 2): patient will complete basic HM w/c level mod I and via RW in stance with CS with good carryover of safe technique OT Short Term Goal 2 (Week 2): patient will complete functional transfers and ambulation with RW CS OT Short Term Goal 3 (Week 2): patient will complete adl/shower tasks set up/CS level     [employer] 10/31/2020, 4:00 PM

## 2020-10-31 NOTE — Progress Notes (Signed)
PROGRESS NOTE   Subjective/Complaints: Patient seen sitting up in bed this AM.  He states he slept better overnight.  He notes improvement in mood.  He states he was told his yard was too small for a ramp, but believe he can navigate his steps with a a crutch. He was seen by Neuropsych yesterday, notes reviewed - going through expected coping.   ROS: Denies CP, SOB, N/V/D  Objective:   No results found. Recent Labs    10/30/20 0515  WBC 4.1  HGB 9.2*  HCT 29.6*  PLT 314   Recent Labs    10/30/20 0515  NA 137  K 3.7  CL 105  CO2 22  GLUCOSE 95  BUN 19  CREATININE 0.77  CALCIUM 9.0    Intake/Output Summary (Last 24 hours) at 10/31/2020 0943 Last data filed at 10/31/2020 0723 Gross per 24 hour  Intake 840 ml  Output 1425 ml  Net -585 ml        Physical Exam: Vital Signs Blood pressure 137/79, pulse 74, temperature 98.3 F (36.8 C), temperature source Oral, resp. rate 17, height 5\' 11"  (1.803 m), SpO2 96 %.  Constitutional: No distress . Vital signs reviewed. HENT: Normocephalic.  Atraumatic. Eyes: EOMI. No discharge. Cardiovascular: No JVD.  RRR. Respiratory: Normal effort.  No stridor.  Bilateral clear to auscultation. GI: Non-distended.  BS +. Skin: Warm and dry.   Right AKA with sutures CDI Psych: Normal mood.  Normal behavior. Musc: Right AKA with improving edema and tenderness.  Neuro: Alert Motor: Bilateral upper extremities, left lower extremity: 5/5 proximal distal, unchanged Right lower extremity: Hip flexion: 4-4+/5 (pain inhibition)  Assessment/Plan: 1. Functional deficits which require 3+ hours per day of interdisciplinary therapy in a comprehensive inpatient rehab setting.  Physiatrist is providing close team supervision and 24 hour management of active medical problems listed below.  Physiatrist and rehab team continue to assess barriers to discharge/monitor patient progress toward  functional and medical goals  Care Tool:  Bathing    Body parts bathed by patient: Right upper leg,Left upper leg,Face,Front perineal area,Right arm,Left arm,Chest,Abdomen,Buttocks,Right lower leg   Body parts bathed by helper: Right lower leg,Buttocks Body parts n/a: Left lower leg   Bathing assist Assist Level: Supervision/Verbal cueing     Upper Body Dressing/Undressing Upper body dressing   What is the patient wearing?: Pull over shirt    Upper body assist Assist Level: Independent    Lower Body Dressing/Undressing Lower body dressing      What is the patient wearing?: Pants     Lower body assist Assist for lower body dressing: Supervision/Verbal cueing     Toileting Toileting    Toileting assist Assist for toileting: Supervision/Verbal cueing     Transfers Chair/bed transfer  Transfers assist     Chair/bed transfer assist level: Contact Guard/Touching assist     Locomotion Ambulation   Ambulation assist   Ambulation activity did not occur: Safety/medical concerns  Assist level: Contact Guard/Touching assist Assistive device: Walker-rolling Max distance: 130'   Walk 10 feet activity   Assist  Walk 10 feet activity did not occur: Safety/medical concerns  Assist level: Contact Guard/Touching assist Assistive device: Walker-rolling  Walk 50 feet activity   Assist Walk 50 feet with 2 turns activity did not occur: Safety/medical concerns  Assist level: Contact Guard/Touching assist Assistive device: Walker-rolling    Walk 150 feet activity   Assist Walk 150 feet activity did not occur: Safety/medical concerns  Assist level: Contact Guard/Touching assist Assistive device: Walker-rolling    Walk 10 feet on uneven surface  activity   Assist Walk 10 feet on uneven surfaces activity did not occur: Safety/medical concerns         Wheelchair     Assist Will patient use wheelchair at discharge?: Yes Type of Wheelchair:  Manual Wheelchair activity did not occur: Safety/medical concerns  Wheelchair assist level: Independent Max wheelchair distance: 200'    Wheelchair 50 feet with 2 turns activity    Assist    Wheelchair 50 feet with 2 turns activity did not occur: Safety/medical concerns   Assist Level: Independent   Wheelchair 150 feet activity     Assist  Wheelchair 150 feet activity did not occur: Safety/medical concerns   Assist Level: Independent     Medical Problem List and Plan: 1.  Deficits with mobility, transfers, self-care secondary to right AKA due to necrotizing fasciitis  Continue CIR 2.  Antithrombotics: -DVT/anticoagulation:  Pharmaceutical: Continue Lovenox             -antiplatelet therapy: ASA 3. Pain Management: Oxycodone as needed. Added scheduled Tylenol q8H. Advised patient to take Oxycodone 30 minutes prior to AM therapy and he is agreeable.   Controlled with meds on 3/1             Monitor with increased exertion, particularly for neuropathic pain 4. Mood: Team to provide ego support.   Fluoxetine started on 2/28 per patient preference             -antipsychotic agents: N/A 5. Neuropsych: This patient is capable of making decisions on his own behalf.  Appreciate Neuropsych recs 6. Skin/Wound Care: Wound VAC DC'd              On vitamin C and Zinc to promote wound healing. 7. Fluids/Electrolytes/Nutrition: Monitor I/Os.             BMP within acceptable range on 2/28 8. HTN: Monitor BP tid and monitor for trends.              Off losartan at this time.    Relatively controlled on 3/1             Moderate increased mobility 9. Acute blood loss anemia:              Hemoglobin 9.2 on 2/28  Hemoccult negative  Received 2 units on 02/13  Received Feraheme, repeat dose on 2/25 10. Recent Covid: Encourage pulmonary hygiene             --Continue Tessalon perles tid. 11. Pre-diabetes: Hgb A1C-5.9. Continue Carb modified diet.   Controlled on 2/28              Monitor increase mobility 12. Hyponatremia: Continue salt tabs for now.             Sodium 137 on 2/28 13.  Drug-induced constipation:   Adjust bowel meds as necessary  Bowel meds increased again on 2/25  Improving with meds  LOS: 11 days A FACE TO FACE EVALUATION WAS PERFORMED  Ankit Lorie Phenix 10/31/2020, 9:43 AM

## 2020-10-31 NOTE — Consult Note (Addendum)
Neuropsychological Consultation   Patient:   Andres [name]   DOB:   1955/12/27  MR Number:  [id number]  Location:  [facility] [address] [facility] [address] [place] [id number] [place] [address] Dept: [place]: [phone]           Date of Service:   10/31/2020  Start Time:   1 PM End Time:   2 PM  Provider/Observer:  Ilean Skill, Psy.D.       Clinical Neuropsychologist       Billing Code/Service: 38182  Chief Complaint:    Andres [name] is a 65 year old male with history of hypertension, BCC.  Patient was admitted on 10/05/2020 with right lower extremity edema due to cellulitis.  Patient was found to be Covid positive but asymptomatic and treated.  MRI was ordered and patient showing progressive changes due to infection for progressive for leukocytosis.  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  Wound culture positive for rare MRSA and BC.  Patient with postoperative pain and initial interactions for poor safety awareness and limitations in self-care requiring CIR due to functional decline.  Patient with acute depressive response with crying spells adjusting to loss of leg, long hospital stay and worry about how his life has changed.  Reason for Service:  Patient was referred for neuropsychological consultation due to coping and adjustment post AKA due to necrotizing fasciitis.  Below is the HPI for the current admission.  HPI:  Andres [name] is a 65 year old male with history of HTN, BCC who was admitted on 10/05/2020 with right lower extremity edema and erythema due to cellulitis.  History taken from chart review and patient.  He also found to be Covid positive but as asymptomatic was treated with remdesivir as well as Rocephin/daptomycin.  MRI of the right lower extremity ordered due to progressive leukocytosis and showed mild  ascites of anterior tibialis and soleus muscle. Dr. 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  Wound culture positive for rare MRSA and BC with 1/2 positive for Corynebacterium felt to be contaminant. Has completed 2 week regimen of rocephin/daptomycin and gabapentin was added for management of neuropathy.  Further complicated by postoperative pain and acute blood loss anemia requiring 2 units PRBC. Pre-renal azotemia resolved with IVF for hydration. Therapy ongoing and patient continues to require encouragement with increased time and effort for mobility, has poor safety awareness, limitations in self-care.  As well as fear affecting mobility and ADLs. CIR recommended due to functional decline.  Please see preadmission assessment from earlier today as well.  Current Status:  Patient laying in bed upon arrival in a slightly elevated position.  Patient with clear discomfort due to phantom limb pain as well as pain at surgical site and upper leg.  Patient reports that he has not experienced any reduction in phantom limb syndrome symptoms but there is a plan to increase gabapentin to try to address it better.  Patient reports that his mood has been much better since the prior day when I saw him the first time.  Patient's mood was much more upbeat and looking forward to discharge soon.  The patient reports that it has been delayed due to not having  a ramp to get into his house.  His workers comp Tourist information centre manager was concerns around safety issues and difficulties being able to get in and out of the house effectively to continue with therapies due to how the house is situated.  They are going to have a long ramp put into the backyard due to lot constraints but this is planned on being a temporary  solution as the patient is motivated and planning on working towards getting a prosthetic as soon as possible.  Patient reports that his mood has been much better today than prior.  Behavioral Observation: Andres [name]  presents as a 65 y.o.-year-old Right Caucasian Male who appeared his stated age. his dress was Appropriate and he was Well Groomed and his manners were Appropriate to the situation.  his participation was indicative of Appropriate and Attentive behaviors.  There were physical disabilities noted.  he displayed an appropriate level of cooperation and motivation.     Interactions:    Active Appropriate and Attentive  Attention:   within normal limits and attention span and concentration were age appropriate  Memory:   within normal limits; recent and remote memory intact  Visuo-spatial:  not examined  Speech (Volume):  normal  Speech:   normal; normal  Thought Process:  Coherent and Relevant  Though Content:  WNL; not suicidal and not homicidal  Orientation:   person, place, time/date and situation  Judgment:   Good  Planning:   Fair  Affect:    Anxious and Depressed  Mood:    Dysphoric  Insight:   Good  Intelligence:   normal  Medical History:   Past Medical History:  Diagnosis Date  . Basal cell carcinoma   . Hypertension   . Reactive depression          Patient Active Problem List   Diagnosis Date Noted  . Reactive depression   . History of hypertension   . Drug induced constipation   . Postoperative pain   . Post-operative pain   . 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[place]) 10/20/2020  . Unilateral AKA, right ([place])   . Acute blood loss anemia   . Prediabetes   . Necrotizing fasciitis of lower leg ([place])   . Abscess of right lower leg   . Cellulitis 10/05/2020  . Cellulitis of right lower extremity 10/05/2020  . COVID-19 virus infection 10/05/2020  . COVID   . Hyponatremia   . Chest pain 11/08/2018  . Accelerated  hypertension 11/08/2018  . Encounter to establish care 07/09/2017  . Essential hypertension 08/16/2015  . Sensorineural hearing loss (SNHL), bilateral 08/16/2015  . Impaired fasting glucose 08/23/2014  . Obesity (BMI 30-39.9) 08/23/2014  . History of nonmelanoma skin cancer 07/15/2012  . Allergic rhinitis 10/24/2011  . Basal cell carcinoma of back 07/10/2011   Psychiatric History:  No prior psychiatric history noted.  Family Med/Psych History:  Family History  Family history unknown: Yes    Risk of Suicide/Violence: low patient denies any suicidal or homicidal ideation.  Impression/DX:  Andres [name] is a 65 year old male with history of hypertension, BCC.  Patient was admitted on 10/05/2020 with right lower extremity edema due to cellulitis.  Patient was found to be Covid positive but asymptomatic and treated.  MRI was ordered and patient showing progressive changes due to infection for progressive for leukocytosis.  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  Wound culture positive for rare MRSA and  BC.  Patient with postoperative pain and initial interactions for poor safety awareness and limitations in self-care requiring CIR due to functional decline.  Patient with acute depressive response with crying spells adjusting to loss of leg, long hospital stay and worry about how his life has changed.  Patient laying in bed upon arrival in a slightly elevated position.  Patient with clear discomfort due to phantom limb pain as well as pain at surgical site and upper leg.  Patient reports that he has not experienced any reduction in phantom limb syndrome symptoms but there is a plan to increase gabapentin to try to address it better.  Patient reports that his mood has been much better since the prior day when I saw him the first time.  Patient's mood was much more upbeat and looking forward to discharge soon.  The  patient reports that it has been delayed due to not having a ramp to get into his house.  His workers comp Tourist information centre manager was concerns around safety issues and difficulties being able to get in and out of the house effectively to continue with therapies due to how the house is situated.  They are going to have a long ramp put into the backyard due to lot constraints but this is planned on being a temporary solution as the patient is motivated and planning on working towards getting a prosthetic as soon as possible.  Patient reports that his mood has been much better today than prior.  Disposition/Plan:  Today we worked on coping and adjustment issues around the loss of his leg due to necrotizing fasciitis.  The patient's mood state has improved and some of his worries and fears have been addressed during our therapeutic sessions particular around future expectations.  Diagnosis:    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[place]) - Plan: Ambulatory referral to Physical Medicine Rehab         Electronically Signed   _______________________ Ilean Skill, Psy.D. Clinical Neuropsychologist

## 2020-10-31 NOTE — Progress Notes (Signed)
Physical Therapy Session Note  Patient Details  Name: Rodarius Kichline MRN: [id number] Date of Birth: 1956-01-11  Today's Date: 10/31/2020 PT Individual Time: 1030-1130 PT Individual Time Calculation (min): 60 min   Short Term Goals: Week 2:  PT Short Term Goal 1 (Week 2): STG=LTG due to ELOS  Skilled Therapeutic Interventions/Progress Updates:    Patient in w/c and reports workers comp came and decided too high for ramp to enter home.  States would like to see hospital bed delivered prior to home and may need one extra day.  SW made aware.  Patient propelling and managing w/c throughout session independently.  Patient negotiated stairs with rail and crutch x 8 (6") steps with min A and cues.    Discussed possibly a neighbor helping him or his son in law.  Patient issued handouts on bumping w/c up steps as well as technique with crutch and rail. Patient performing sit to stand transfers throughout session with S.  Ambulated with CGA x 135' with RW slow technique with pt keeping eyes forward more without cues.  Patient performed standing hip extension and abduction on R x 10, seated hip adductor sets x 10 w/ 5 sec hold with ball between legs, seated armchair push ups x 10.  Ambulated to mat CGA x 18'.  Sit to supine mod I.  Patient performed R hip extension in supine lifting hip with towel roll under x 10 w/ 5 sec hold, L single leg bridge lifting R leg w/ 5 sec hold x 10.  Patient rolled to prone and performed L LE knee flexion and hip extension x 10 and R hip extension x10.  Patient rolled and from side to sit mod I.  Patient ambulated to w/c with S and propelled to room mod I.  Discussed plan for extending stay till 3/3 and for possibly asking son in law to come for therapy tomorrow. Patient transferred to bed with S squat pivot and left in bed with all needs in reach.  Therapy Documentation Precautions:  Precautions Precautions: Fall Precaution Comments: wound vac on R Restrictions Weight  Bearing Restrictions: No RLE Weight Bearing: Non weight bearing Pain: Pain Assessment Pain Scale: 0-10 Pain Score: 3  Pain Type: Acute pain Pain Location: Leg Pain Orientation: Right Pain Descriptors / Indicators: Operative site guarding Pain Onset: On-going Pain Intervention(s): Distraction;Ambulation/increased activity   Therapy/Group: Individual Therapy  Reginia Naas  Magda Kiel, PT 10/31/2020, 11:21 AM

## 2020-10-31 NOTE — Progress Notes (Signed)
Occupational Therapy Session Note  Patient Details  Name: Andres [name] MRN: [id number] Date of Birth: November 11, 1955  Today's Date: 10/31/2020 OT Individual Time: 0900-1000   &   1400-1500 OT Individual Time Calculation (min): 60 min    &   60 min   Short Term Goals: Week 1:  OT Short Term Goal 1 (Week 1): Pt will complete BSC transfers with CGA or less. OT Short Term Goal 2 (Week 1): Pt will be able to doff pants over hips on BSC with min A. OT Short Term Goal 3 (Week 1): Pt will be able to pull pants over hips with min -mod A on BSC. OT Short Term Goal 4 (Week 1): Pt will be able to sit to stand with RW with mod A. OT Short Term Goal 5 (Week 1): Pt will be able to tolerate standing with RW for 1 minute.  Skilled Therapeutic Interventions/Progress Updates:    AM session:   Patient in bed, alert and ready for therapy session.  He indicates that workers Engineer, site will not have house ready for discharge tomorrow.  He denies pain and requests a shower this session.  Supine to sitting edge of bed independent.  Sit to stand and ambulation with RW to/from bed, shower bench, w/c CG - occ cues for safety, able to gather items needed for shower with CS.  Shower completed seated on shower bench DS with hand held shower and grab bars.  Dressing completed seated on shower bench CS.  Grooming tasks mod I seated w/c level at sink.  Reviewed reach and transport, energy conservation techniques and safe set up of home environment.  He was able to propel w/c to and from therapy gym, completed upper body ergometer 12 minutes on level 6.  Returned to room, remained seated in w/c at close of session, call bell in reach.     PM session:   Patient in bed, ready for afternoon session.  Able to move in bed to sitting position mod I.  donns shoe with set up.  SPT with RW CS.  He ambulated on unit with RW approx 60 feet x 2 this session with CS.  Able to propel w/c as needed on unit with set up.  Reviewed basic HM -  he is able to manage from a w/c level mod I with increased time.  Reviewed and practiced ramp w/c level and provided options for covering multiple lengths of ramp with good results.  Completed trunk and upperbody conditioning activities seated edge of mat.  He experienced increased phantom pain during session today that was relieved with mirror therapy.  Patient remained seated in w/c at close of session, call bell and tray table accessible via w/c.       Therapy Documentation Precautions:  Precautions Precautions: Fall Precaution Comments: wound vac on R Restrictions Weight Bearing Restrictions: No RLE Weight Bearing: Non weight bearing  Therapy/Group: Individual Therapy  Carlos Levering 10/31/2020, 7:33 AM

## 2020-10-31 NOTE — Progress Notes (Addendum)
Patient ID: Andres [name], male   DOB: September 08, 1955, 65 y.o.   MRN: [id number] Received call from [employer] who has received equipment order but needs much information to complete the order. Have sent needed information so can set up equipment delivery. Workers Comp was suppose to arrange and are supposedly arranging home health follow up. Unsure if will be in place for discharge tomorrow?  11:06 Am Can not get equipment in place for discharge tomorrow and will need son in-law to do stair training prior to discharge home. Will need extra day. New discharge date 3/3.

## 2020-11-01 MED ORDER — GABAPENTIN 300 MG PO CAPS
300.0000 mg | ORAL_CAPSULE | Freq: Three times a day (TID) | ORAL | Status: DC
  Administered 2020-11-01 – 2020-11-02 (×3): 300 mg via ORAL
  Filled 2020-11-01 (×3): qty 1

## 2020-11-01 MED ORDER — LOSARTAN POTASSIUM 25 MG PO TABS
25.0000 mg | ORAL_TABLET | Freq: Every day | ORAL | Status: DC
  Administered 2020-11-01 – 2020-11-03 (×3): 25 mg via ORAL
  Filled 2020-11-01 (×3): qty 1

## 2020-11-01 NOTE — Patient Care Conference (Signed)
Inpatient RehabilitationTeam Conference and Plan of Care Update Date: 11/01/2020   Time: 11:39 AM    Patient Name: Andres [name]      Medical Record Number: [id number]  Date of Birth: 1956/02/24 Sex: Male         Room/Bed: 4M11C/4M11C-01 Payor Info: Payor: [employer] / Plan: [employer] / Product Type: *No Product type* /    Admit Date/Time:  10/20/2020  3:56 PM  Primary Diagnosis:  Above knee amputation of right lower extremity [facility])  Hospital Problems: Principal Problem:   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[place]) Active Problems:   Drug induced constipation   Postoperative pain   Post-operative pain   History of hypertension   Reactive depression    Expected Discharge Date: Expected Discharge Date: 11/02/20 (D/C pending ramp installation)  Team Members Present: Physician leading conference: Dr. Leeroy Cha Care Coodinator Present: Dorien Chihuahua, RN, BSN, CRRN;Becky Dupree, LCSW Nurse Present: Pamella Pert) [place], LPN PT Present: Magda Kiel, PT OT Present: Elisabeth Most, OT PPS Coordinator present : Gunnar Fusi, Novella Olive, PT     Current Status/Progress Goal Weekly Team Focus  Bowel/Bladder   Continent x 2.  Continue to be continent.  Assess every shift and PRN.   Swallow/Nutrition/ Hydration             ADL's   bathing and dressing set up/supervision shower level, toileting supervision, functional mobility and transfers CS with RW  set up/supervision adl and transfers - shower level and with LRAD  discharge planning, family education   Mobility   independent bed mobility and w/c mobility, S sit to stand, CGA to S ambulation RW 135;, 8 steps with rail and crutch min A  supervision, CGA gait, set up assist for WC mobility  dynamic standing balance, gait endurance, stairs for home entry, HEP   Communication             Safety/Cognition/ Behavioral Observations            Pain   Pain in RLE of 2-10 out of 10.   Pain equal to or less than 3. Current meds effective.  Assess every shift and PRN.   Skin   Staples to RAKA surgical wound. No complications.  No skin breakdown and continued healing of surgical site without infection.  Assess every shift and PRN.     Discharge Planning:  Equipment to be delviered today, ramp to be put up unsure when will be complete. Home with wife who can provide 24/7 supervision at DC   Team Discussion: Ramp not installed for entry to home. Will need alternative means for entry to home until ramp installed. Mirror therapy recommended for phantom pain.  Patient on target to meet rehab goals: yes, currently supervision for transfers/shower transfers. Patient is at goal level overall however does need support for maintaining standing balance but does well with wheelchair mobility.  *See Care Plan and progress notes for long and short-term goals.   Revisions to Treatment Plan:   Teaching Needs: Transfers, toileting, safety, medications, skin care, etc.   Current Barriers to Discharge: Decreased caregiver support and Home enviroment access/layout  Possible Resolutions to Barriers: Ambulance transport to home Ramp installation for home entry    Medical Summary Current Status: Deficits with mobility, transfers, self-care secondary to right AKA due to necrotizing fasciitis, hypertensive, anemic, with phantom limb pain  Barriers to Discharge: Medical stability;Wound care  Barriers to Discharge Comments: Deficits with mobility, transfers, self-care secondary to right  AKA due to necrotizing fasciitis, hypertensive, anemic, with phantom limb pain Possible Resolutions to [employer] Focus: [place] BP TID, restart Cozaar 25mg  daily, monitor hgb weekly, increase gabapentin to 300mg  TID, continue mirror therapy   Continued Need for Acute Rehabilitation Level of Care: The patient requires daily medical management by a physician with specialized training in physical medicine  and rehabilitation for the following reasons: Direction of a multidisciplinary physical rehabilitation program to maximize functional independence : Yes Medical management of patient stability for increased activity during participation in an intensive rehabilitation regime.: Yes Analysis of laboratory values and/or radiology reports with any subsequent need for medication adjustment and/or medical intervention. : Yes   I attest that I was present, lead the team conference, and concur with the assessment and plan of the team.   Dorien Chihuahua B 11/01/2020, 4:17 PM

## 2020-11-01 NOTE — Progress Notes (Signed)
Occupational Therapy Session Note  Patient Details  Name: Andres [name] MRN: [id number] Date of Birth: Nov 16, 1955  Today's Date: 11/01/2020 OT Individual Time: 0900-1000   &   1400-1500 OT Individual Time Calculation (min): 60 min    &   60 min   Short Term Goals: Week 2:  OT Short Term Goal 1 (Week 2): patient will complete basic HM w/c level mod I and via RW in stance with CS with good carryover of safe technique OT Short Term Goal 2 (Week 2): patient will complete functional transfers and ambulation with RW CS OT Short Term Goal 3 (Week 2): patient will complete adl/shower tasks set up/CS level  Skilled Therapeutic Interventions/Progress Updates:    AM session:   Patient in bed, alert and ready for therapy session.  Reviewed circumstances of fall yesterday afternoon from commode in bathroom - discussed method for transfer and ensuring that both arms are pushing off of surface like commode to ensure that is doesn't tip to one side vs using grab bar that is stable, 2 hands on walker etc.   He demonstrates good understanding.  He notes mild pain in residual limb.  No bruising, redness or drainage.  Ongoing phantom pain.   Supine to sitting mod I.  Sit to stand and ambulation with RW to/from bed, shower bench and w/c with CS.  Bathing completed with DS, seated on shower bench, hand held shower and grab bar.  Dressing completed seated on shower bench CS/set up for lower body, independent upper body.  Grooming completed w/c level at sink mod I.  Completed 50 foot walk on unit with CS.  He returned to bed at close of session with CS.  Bed alarm set and call bell in hand.     PM session:   Patient in bed, alert and ready for therapy session.  Bed mobility mod I.  SPT and short distance ambulation with RW CS.  Propels w/c to/from therapy gym mod I.  Utilized mirror to address phantom pain with good success.  Completed w/c mobility on ramp for multiple reps for conditioning without difficulty.   Completed unsupported mat core/abdominal exercises, shoulder and conditioning activities.  Upper body ergometer x 14 minutes.   He remained seated in w/c at close of session awaiting start of PT.       Therapy Documentation Precautions:  Precautions Precautions: Fall Precaution Comments: wound vac on R Restrictions Weight Bearing Restrictions: Yes RLE Weight Bearing: Non weight bearing  Therapy/Group: Individual Therapy  Carlos Levering 11/01/2020, 7:37 AM

## 2020-11-01 NOTE — Progress Notes (Signed)
PROGRESS NOTE   Subjective/Complaints: Complains of neuropathic pain this morning- mirror therapy was very helpful. Discussed with Anderson Malta whether we could get mirror for patient in room. Increased gabapentin to 300mg  TID. No other complaints  ROS: Denies CP, SOB, N/V/D  Objective:   No results found. Recent Labs    10/30/20 0515  WBC 4.1  HGB 9.2*  HCT 29.6*  PLT 314   Recent Labs    10/30/20 0515  NA 137  K 3.7  CL 105  CO2 22  GLUCOSE 95  BUN 19  CREATININE 0.77  CALCIUM 9.0    Intake/Output Summary (Last 24 hours) at 11/01/2020 1143 Last data filed at 11/01/2020 0843 Gross per 24 hour  Intake 600 ml  Output 1750 ml  Net -1150 ml        Physical Exam: Vital Signs Blood pressure (!) 156/92, pulse 85, temperature 97.7 F (36.5 C), temperature source Oral, resp. rate 20, height 5\' 11"  (1.803 m), SpO2 98 %.  Gen: no distress, normal appearing HEENT: oral mucosa pink and moist, NCAT Cardio: Reg rate Chest: normal effort, normal rate of breathing Abd: soft, non-distended Ext: no edema Psych: pleasant, normal affect Skin:  Right AKA with sutures CDI Psych: Normal mood.  Normal behavior. Musc: Right AKA with improving edema and tenderness.  Neuro: Alert Motor: Bilateral upper extremities, left lower extremity: 5/5 proximal distal, unchanged Right lower extremity: Hip flexion: 4-4+/5 (pain inhibition)  Assessment/Plan: 1. Functional deficits which require 3+ hours per day of interdisciplinary therapy in a comprehensive inpatient rehab setting.  Physiatrist is providing close team supervision and 24 hour management of active medical problems listed below.  Physiatrist and rehab team continue to assess barriers to discharge/monitor patient progress toward functional and medical goals  Care Tool:  Bathing    Body parts bathed by patient: Right upper leg,Left upper leg,Face,Front perineal area,Right  arm,Left arm,Chest,Abdomen,Buttocks,Right lower leg   Body parts bathed by helper: Right lower leg,Buttocks Body parts n/a: Left lower leg   Bathing assist Assist Level: Supervision/Verbal cueing     Upper Body Dressing/Undressing Upper body dressing   What is the patient wearing?: Pull over shirt    Upper body assist Assist Level: Independent    Lower Body Dressing/Undressing Lower body dressing      What is the patient wearing?: Pants,Ace wrap/stump shrinker     Lower body assist Assist for lower body dressing: Supervision/Verbal cueing     Toileting Toileting    Toileting assist Assist for toileting: Supervision/Verbal cueing     Transfers Chair/bed transfer  Transfers assist     Chair/bed transfer assist level: Contact Guard/Touching assist Chair/bed transfer assistive device: Programmer, multimedia   Ambulation assist   Ambulation activity did not occur: Safety/medical concerns  Assist level: Contact Guard/Touching assist Assistive device: Walker-rolling Max distance: 135'   Walk 10 feet activity   Assist  Walk 10 feet activity did not occur: Safety/medical concerns  Assist level: Contact Guard/Touching assist Assistive device: Walker-rolling   Walk 50 feet activity   Assist Walk 50 feet with 2 turns activity did not occur: Safety/medical concerns  Assist level: Contact Guard/Touching assist Assistive device: Walker-rolling  Walk 150 feet activity   Assist Walk 150 feet activity did not occur: Safety/medical concerns  Assist level: Contact Guard/Touching assist Assistive device: Walker-rolling    Walk 10 feet on uneven surface  activity   Assist Walk 10 feet on uneven surfaces activity did not occur: Safety/medical concerns         Wheelchair     Assist Will patient use wheelchair at discharge?: Yes Type of Wheelchair: Manual Wheelchair activity did not occur: Safety/medical concerns  Wheelchair assist  level: Independent Max wheelchair distance: 200'    Wheelchair 50 feet with 2 turns activity    Assist    Wheelchair 50 feet with 2 turns activity did not occur: Safety/medical concerns   Assist Level: Independent   Wheelchair 150 feet activity     Assist  Wheelchair 150 feet activity did not occur: Safety/medical concerns   Assist Level: Independent     Medical Problem List and Plan: 1.  Deficits with mobility, transfers, self-care secondary to right AKA due to necrotizing fasciitis  Continue CIR 2.  Antithrombotics: -DVT/anticoagulation:  Pharmaceutical: Continue Lovenox             -antiplatelet therapy: ASA 3. Pain Management: Oxycodone as needed. Added scheduled Tylenol q8H. Advised patient to take Oxycodone 30 minutes prior to AM therapy and he is agreeable.   Controlled with meds on 3/1  Increase Gabapentin to 300mg  TID             Monitor with increased exertion, particularly for neuropathic pain 4. Mood: Team to provide ego support.   Fluoxetine started on 2/28 per patient preference             -antipsychotic agents: N/A 5. Neuropsych: This patient is capable of making decisions on his own behalf.  Appreciate Neuropsych recs 6. Skin/Wound Care: Wound VAC DC'd              On vitamin C and Zinc to promote wound healing. 7. Fluids/Electrolytes/Nutrition: Monitor I/Os.             BMP within acceptable range on 2/28 8. HTN: Monitor BP tid and monitor for trends.   Elevated to 157/78- has been elevated- restart Losartan 25mg  daily.              Moderate increased mobility 9. Acute blood loss anemia:              Hemoglobin 9.2 on 2/28, monitor weekly  Hemoccult negative  Received 2 units on 02/13  Received Feraheme, repeat dose on 2/25 10. Recent Covid: Encourage pulmonary hygiene             --Continue Tessalon perles tid. 11. Pre-diabetes: Hgb A1C-5.9. Continue Carb modified diet.   Controlled on 2/28             Monitor increase mobility 12.  Hyponatremia: Continue salt tabs for now.             Sodium 137 on 2/28 13.  Drug-induced constipation:   Adjust bowel meds as necessary  Bowel meds increased again on 2/25  Improving with meds  LOS: 12 days A FACE TO FACE EVALUATION WAS PERFORMED  Andres [name] 11/01/2020, 11:43 AM

## 2020-11-01 NOTE — Progress Notes (Signed)
Physical Therapy Session Note  Patient Details  Name: Andres [name] MRN: [id number] Date of Birth: 1955/09/29  Today's Date: 11/01/2020 PT Individual Time: 9528-4132 PT Individual Time Calculation (min): 58 min   Short Term Goals: Week 2:  PT Short Term Goal 1 (Week 2): STG=LTG due to ELOS  Skilled Therapeutic Interventions/Progress Updates:    Patient in w/c and reports would like to go outside.  Patient propelling in w/c throughout session independent managing legrest and brakes and over thresholds.  Sit to stand with S and ambulated 2 x 80' with RW and CGA to S with occasional cues for forward gaze.  Patient propelled to ADL apartment and managing in small space without difficulty.  Sit to stand at counter and performed 2 x 10 reps lateral weight shifts, anterior/posterior weight shifts and circles x 5 each direction with cues for lining up with center divider for COG and with bilateral UE support.  Patient propelled w/c over 400' down elevator and outside on unlevel terrain (brick) independently.  Managed a downward incline with S and up incline backwards using foot as well with S about 40' incline.  Patient propelled back to room and performed pivot to bed with S and left in supine with call bell and needs in reach and bed alarm activated.   Therapy Documentation Precautions:  Precautions Precautions: Fall Precaution Comments: wound vac on R Restrictions Weight Bearing Restrictions: Yes RLE Weight Bearing: Non weight bearing Pain: Pain Assessment Faces Pain Scale: Hurts little more Pain Type: Phantom pain;Acute pain Pain Location: Leg Pain Orientation: Right Pain Descriptors / Indicators: Tingling;Throbbing Pain Onset: On-going Pain Intervention(s): Other (Comment) (mirror therapy)   Therapy/Group: Individual Therapy  Reginia Naas  [place], [place] 11/01/2020, 5:03 PM

## 2020-11-01 NOTE — Progress Notes (Signed)
Occupational Therapy Discharge Summary  Patient Details  Name: Andres [name] MRN: [id number] Date of Birth: October 09, 1955     Patient has met 7 of 7 long term goals due to improved activity tolerance, improved balance, postural control and improved coordination.  Patient to discharge at overall Supervision level.  Patient's care partner is independent to provide the necessary physical assistance at discharge.    Reasons goals not met: na  Recommendation:  Recommend supervision for activities in stance to ensure safety while residual limb is healing and in preparation for prosthetic training.  Patient will benefit from ongoing skilled OT services in outpatient setting to continue to advance functional skills in the area of BADL, iADL and Vocation.  Equipment: 3 in 1 commode, tub transfer bench, hospital bed  Reasons for discharge: treatment goals met  Patient/family agrees with progress made and goals achieved: Yes  OT Discharge Precautions/Restrictions  Precautions Precautions: Fall Restrictions Weight Bearing Restrictions: Yes RLE Weight Bearing: Non weight bearing  ADL ADL Eating: Independent Grooming: Independent Where Assessed-Grooming: Sitting at sink Upper Body Bathing: Independent Where Assessed-Upper Body Bathing: Shower Lower Body Bathing: Supervision/safety Where Assessed-Lower Body Bathing: Shower Upper Body Dressing: Independent Where Assessed-Upper Body Dressing: Chair Lower Body Dressing: Supervision/safety Where Assessed-Lower Body Dressing: Chair Toileting: Supervision/safety Where Assessed-Toileting: Glass blower/designer: Close supervision Toilet Transfer Method: Counselling psychologist: Grab bars,Raised Counselling psychologist: Close supervision Social research officer, government Method: Heritage manager: Radio broadcast assistant Vision Baseline Vision/History: Wears glasses Wears Glasses: At all times Patient Visual  Report: No change from baseline Vision Assessment?: No apparent visual deficits Perception  Perception: Within Functional Limits Praxis Praxis: Intact Cognition Overall Cognitive Status: Within Functional Limits for tasks assessed Arousal/Alertness: Awake/alert Orientation Level: Oriented X4 Awareness: Appears intact Problem Solving: Appears intact Safety/Judgment: Appears intact Sensation Sensation Light Touch: Appears Intact Proprioception: Appears Intact Coordination Gross Motor Movements are Fluid and Coordinated: Yes Fine Motor Movements are Fluid and Coordinated: Yes Motor  Motor Motor: Within Functional Limits Motor - Discharge Observations: phantom pain - mirror therapy effective Mobility  Bed Mobility Bed Mobility: Supine to Sit;Sit to Supine Left Sidelying to Sit: Independent Supine to Sit: Independent Sitting - Scoot to Edge of Bed: Independent Sit to Supine: Independent Sit to Sidelying Left: Independent Scooting to HOB: Independent  Trunk/Postural Assessment  Postural Control Postural Control: Within Functional Limits  Balance Balance Balance Assessed: Yes Static Sitting Balance Static Sitting - Level of Assistance: 7: Independent Dynamic Sitting Balance Dynamic Sitting - Level of Assistance: 6: Modified independent (Device/Increase time) Static Standing Balance Static Standing - Level of Assistance: 5: Stand by assistance Dynamic Standing Balance Dynamic Standing - Level of Assistance: 4: Min assist;5: Stand by assistance Extremity/Trunk Assessment RUE Assessment RUE Assessment: Within Functional Limits LUE Assessment LUE Assessment: Within Functional Limits   Erline Levine A Kinter 11/01/2020, 4:10 PM

## 2020-11-01 NOTE — Progress Notes (Addendum)
Patient ID: Andres [name], male   DOB: 13-Jan-1956, 65 y.o.   MRN: [id number]  Met with pt to discuss main goal is to get home safely and have the ramp in place, if this takes a few days then will need to be here and will continue to get therapy while here. Will keep MD and therapy team updated regarding when DME in place and ramp completed. Team conference today.  1:55 PM Met with pt after team conference goals-progress and medical issues-pain, etc. Wife at home waiting for equipment delivery.

## 2020-11-02 MED ORDER — GABAPENTIN 400 MG PO CAPS
400.0000 mg | ORAL_CAPSULE | Freq: Three times a day (TID) | ORAL | Status: DC
  Administered 2020-11-02 – 2020-11-03 (×3): 400 mg via ORAL
  Filled 2020-11-02 (×3): qty 1

## 2020-11-02 NOTE — Plan of Care (Signed)
  Problem: Consults Goal: RH LIMB LOSS PATIENT EDUCATION Description: Description: See Patient Education module for eduction specifics. Outcome: Progressing   Problem: RH SKIN INTEGRITY Goal: RH STG SKIN FREE OF INFECTION/BREAKDOWN Description: Skin remain free of infection and breakdown with min assist Outcome: Progressing Goal: RH STG ABLE TO PERFORM INCISION/WOUND CARE W/ASSISTANCE Description: STG Able To Perform Incision/Wound Care With Assistance. Min Outcome: Progressing   Problem: RH PAIN MANAGEMENT Goal: RH STG PAIN MANAGED AT OR BELOW PT'S PAIN GOAL Description: Pain less than 4 Outcome: Progressing   Problem: RH BOWEL ELIMINATION Goal: RH STG MANAGE BOWEL WITH ASSISTANCE Description: STG Manage Bowel with Assistance. Outcome: Progressing

## 2020-11-02 NOTE — Progress Notes (Signed)
Occupational Therapy Session Note  Patient Details  Name: Andres [name] MRN: [id number] Date of Birth: 1955/12/03  Today's Date: 11/02/2020 OT Individual Time: 1100-1200 OT Individual Time Calculation (min): 60 min    Short Term Goals: Week 1:  OT Short Term Goal 1 (Week 1): Pt will complete BSC transfers with CGA or less. OT Short Term Goal 1 - Progress (Week 1): Met OT Short Term Goal 2 (Week 1): Pt will be able to doff pants over hips on BSC with min A. OT Short Term Goal 2 - Progress (Week 1): Met OT Short Term Goal 3 (Week 1): Pt will be able to pull pants over hips with min -mod A on BSC. OT Short Term Goal 3 - Progress (Week 1): Met OT Short Term Goal 4 (Week 1): Pt will be able to sit to stand with RW with mod A. OT Short Term Goal 4 - Progress (Week 1): Met OT Short Term Goal 5 (Week 1): Pt will be able to tolerate standing with RW for 1 minute. OT Short Term Goal 5 - Progress (Week 1): Met  Skilled Therapeutic Interventions/Progress Updates:     Pt received in bed with phantom pain but mirror therapy helps/pt requesting shower   ADL:  Pt completes full ADL from amb level with RW. Pt completes all transfers/ADL components with set up. A only for taping cover over R residual limb. Pt completes all BADLs from seated level for lateral leans for dressing as well as bathing. Pt very meticulous and careful with all mobility demoing good safety awareness and w/c parts management.   Therapeutic activity Pt propels to/from outside courtyard with BUE and S and VC for backing into elevator. Pt educated on first step handbook and oriented to sections that are important   Pt left at end of session in bed with exit alarm on, call light in reach and all needs met   Therapy Documentation Precautions:  Precautions Precautions: Fall Precaution Comments: wound vac on R Restrictions Weight Bearing Restrictions: Yes RLE Weight Bearing: Non weight bearing General:   Vital  Signs: Therapy Vitals Temp: 98 F (36.7 C) Pulse Rate: 70 Resp: 18 BP: 140/78 Patient Position (if appropriate): Lying Oxygen Therapy SpO2: 97 % Pain: Pain Assessment Pain Scale: 0-10 Pain Score: Asleep ADL: ADL Eating: Independent Grooming: Independent Where Assessed-Grooming: Sitting at sink Upper Body Bathing: Independent Where Assessed-Upper Body Bathing: Shower Lower Body Bathing: Supervision/safety Where Assessed-Lower Body Bathing: Shower Upper Body Dressing: Independent Where Assessed-Upper Body Dressing: Chair Lower Body Dressing: Supervision/safety Where Assessed-Lower Body Dressing: Chair Toileting: Supervision/safety Where Assessed-Toileting: Glass blower/designer: Close supervision Toilet Transfer Method: Counselling psychologist: Grab bars,Raised Counselling psychologist: Close supervision Social research officer, government Method: Heritage manager: Nurse, learning disability    Praxis   Exercises:   Other Treatments:     Therapy/Group: Individual Therapy  Tonny Branch 11/02/2020, 6:56 AM

## 2020-11-02 NOTE — Progress Notes (Signed)
PROGRESS NOTE   Subjective/Complaints: Did not feel great benefit from increase in Gabapentin to 300mg  TID.  He is still having a hard time sleeping at night due to pain Discussed increasing Gabapentin further to 400mg  TID and he is agreeablle.  ROS: Denies CP, SOB, N/V/D, +phantom limb pain.  Objective:   No results found. No results for input(s): WBC, HGB, HCT, PLT in the last 72 hours. No results for input(s): NA, K, CL, CO2, GLUCOSE, BUN, CREATININE, CALCIUM in the last 72 hours.  Intake/Output Summary (Last 24 hours) at 11/02/2020 1628 Last data filed at 11/02/2020 1334 Gross per 24 hour  Intake 480 ml  Output 2225 ml  Net -1745 ml        Physical Exam: Vital Signs Blood pressure 128/84, pulse 87, temperature 98.4 F (36.9 C), temperature source Oral, resp. rate 17, height 5\' 11"  (1.803 m), SpO2 97 %.  Gen: no distress, normal appearing HEENT: oral mucosa pink and moist, NCAT Cardio: Reg rate Chest: normal effort, normal rate of breathing Abd: soft, non-distended Ext: no edema Psych: pleasant, normal affect Skin:  Right AKA with sutures CDI Psych: Normal mood.  Normal behavior. Musc: Right AKA with improving edema and tenderness.  Neuro: Alert Motor: Bilateral upper extremities, left lower extremity: 5/5 proximal distal, unchanged Right lower extremity: Hip flexion: 4-4+/5 (pain inhibition)  Assessment/Plan: 1. Functional deficits which require 3+ hours per day of interdisciplinary therapy in a comprehensive inpatient rehab setting.  Physiatrist is providing close team supervision and 24 hour management of active medical problems listed below.  Physiatrist and rehab team continue to assess barriers to discharge/monitor patient progress toward functional and medical goals  Care Tool:  Bathing    Body parts bathed by patient: Right upper leg,Left upper leg,Face,Front perineal area,Right arm,Left  arm,Chest,Abdomen,Buttocks,Right lower leg   Body parts bathed by helper: Right lower leg,Buttocks Body parts n/a: Left lower leg   Bathing assist Assist Level: Set up assist     Upper Body Dressing/Undressing Upper body dressing   What is the patient wearing?: Pull over shirt    Upper body assist Assist Level: Independent    Lower Body Dressing/Undressing Lower body dressing      What is the patient wearing?: Pants,Ace wrap/stump shrinker     Lower body assist Assist for lower body dressing: Set up assist     Toileting Toileting    Toileting assist Assist for toileting: Set up assist     Transfers Chair/bed transfer  Transfers assist     Chair/bed transfer assist level: Supervision/Verbal cueing Chair/bed transfer assistive device: Programmer, multimedia   Ambulation assist   Ambulation activity did not occur: Safety/medical concerns  Assist level: Contact Guard/Touching assist Assistive device: Walker-rolling Max distance: 80'   Walk 10 feet activity   Assist  Walk 10 feet activity did not occur: Safety/medical concerns  Assist level: Contact Guard/Touching assist Assistive device: Walker-rolling   Walk 50 feet activity   Assist Walk 50 feet with 2 turns activity did not occur: Safety/medical concerns  Assist level: Contact Guard/Touching assist Assistive device: Walker-rolling    Walk 150 feet activity   Assist Walk 150 feet activity did  not occur: Safety/medical concerns  Assist level: Contact Guard/Touching assist Assistive device: Walker-rolling    Walk 10 feet on uneven surface  activity   Assist Walk 10 feet on uneven surfaces activity did not occur: Safety/medical concerns         Wheelchair     Assist Will patient use wheelchair at discharge?: Yes Type of Wheelchair: Manual Wheelchair activity did not occur: Safety/medical concerns  Wheelchair assist level: Independent Max wheelchair distance: 400     Wheelchair 50 feet with 2 turns activity    Assist    Wheelchair 50 feet with 2 turns activity did not occur: Safety/medical concerns   Assist Level: Independent   Wheelchair 150 feet activity     Assist  Wheelchair 150 feet activity did not occur: Safety/medical concerns   Assist Level: Independent     Medical Problem List and Plan: 1.  Deficits with mobility, transfers, self-care secondary to right AKA due to necrotizing fasciitis  Continue CIR 2.  Antithrombotics: -DVT/anticoagulation:  Pharmaceutical: Continue Lovenox             -antiplatelet therapy: ASA 3. Pain Management: Oxycodone as needed. Added scheduled Tylenol q8H. Advised patient to take Oxycodone 30 minutes prior to AM therapy and he is agreeable.   Increase Gabapentin to 400mg  TID             Monitor with increased exertion, particularly for neuropathic pain 4. Mood: Team to provide ego support.   Fluoxetine started on 2/28 per patient preference, continue             -antipsychotic agents: N/A 5. Neuropsych: This patient is capable of making decisions on his own behalf.  Appreciate Neuropsych recs 6. Skin/Wound Care: Wound VAC DC'd              On vitamin C and Zinc to promote wound healing. 7. Fluids/Electrolytes/Nutrition: Monitor I/Os.             BMP within acceptable range on 2/28 8. HTN: Monitor BP tid and monitor for trends.   Elevated to 157/78- has been elevated- restart Losartan 25mg  daily.   3/3: much better controlled, continue Losartan.              Moderate increased mobility 9. Acute blood loss anemia:              Hemoglobin 9.2 on 2/28, monitor weekly  Hemoccult negative  Received 2 units on 02/13  Received Feraheme, repeat dose on 2/25 10. Recent Covid: Encourage pulmonary hygiene             --Continue Tessalon perles tid. 11. Pre-diabetes: Hgb A1C-5.9. Continue Carb modified diet.   Controlled on 2/28             Monitor increase mobility 12. Hyponatremia: Continue  salt tabs for now.             Sodium 137 on 2/28 13.  Drug-induced constipation:   Adjust bowel meds as necessary  Bowel meds increased again on 2/25  Improving with meds  LOS: 13 days A FACE TO FACE EVALUATION WAS PERFORMED  [name] 11/02/2020, 4:28 PM

## 2020-11-02 NOTE — Progress Notes (Addendum)
Patient ID: Andres [name], male   DOB: August 23, 1956, 65 y.o.   MRN: [id number] Received call from [facility] unable to locate a hospital bed for pt. Have given her other local DME companies to pursue, to obtain one. Message left for [employer]ie[employer]-CM to see status of ramp.Await return call.  10;15 Am Pt reports wheelchair, bedside commode, rolling walker and tub bench-delivered to home yesterday. According to him to receive call for when ramp will be installed. Still needs a hospital bed. Await return call from [employer] CM and Home link-Sara  2:05 PM Spoke with  Brandon-:[facility] has contacted pt to set up delivery of hospital bed. Still waiting on ramp installation information from [employer] CM. Have sent message probably not discharging tomorrow, due to ramp not installed yet.

## 2020-11-02 NOTE — Progress Notes (Signed)
Physical Therapy Session Note  Patient Details  Name: Andres [name] MRN: [id number] Date of Birth: Feb 03, 1956  Today's Date: 11/02/2020 PT Individual Time:Session1: 2637-8588; [place]: 5027-7412 PT Individual Time Calculation (min): 57 min & 45 min  Short Term Goals: Week 2:  PT Short Term Goal 1 (Week 2): STG=LTG due to ELOS  Skilled Therapeutic Interventions/Progress Updates:    Session1: Patient in supine and reports difficulty getting to sleep due to phantom pain last night.  Did not take sleep aid.  Patient supine to sit independent and donned shoe independent at EOB.  Performed squat pivot to w/c with S.  Patient at sink to perform grooming task.  Propelled in w/c to therapy gym x 130'.  Performed standing car transfer with S and cues.  Negotiated ramp with RW and CGA.  Patient indep sit to supine on mat and performed therex as noted below.  Patient sit to stand S and standing at sink to stack and unstack cones from second shelf to counter level then to chair on R then back to counter.  Negotiated ramp x 7 in w/c up in reverse also using L LE and down forward.  Patient sit to stand with S and ambulated to room with close S to CGA with RW x 140' no cues needed for forward gaze.  Patient performed squat pivot to bed with S and left with bed alarm active and call bell in reach.   [place]: Patient in supine and reports feeling well.  Supine to sit independent and donned shoe at EOB.  Squat pivot to w/c with S.  Patient propelled w/c independent to ortho gym.  Performed standing balance activity at BITs touching targets and working on memory over 5 minutes with single UE support intermittently alternating hands and close S.  Patient propelled in w/c to ground level exit and performed ambulation on brick terrain with RW and CGA x 50'.  Patient propelled in w/c back to room independent over thresholds and changes in flooring/brick.  Patient sit to stand in room to RW with S and ambulated around bed  to recliner with S with RW.  Patient left in recliner with needs and call bell in reach and SW in the room.   Therapy Documentation Precautions:  Precautions Precautions: Fall Precaution Comments: wound vac on R Restrictions Weight Bearing Restrictions: Yes RLE Weight Bearing: Non weight bearing Pain: Pain Assessment Pain Scale: 0-10 Pain Score: 5  Pain Type: Phantom pain Pain Location: Leg Pain Orientation: Right Pain Descriptors / Indicators: Tingling;Throbbing Pain Onset: On-going Pain Intervention(s): Distraction;Ambulation/increased activity Exercises: Total Joint Exercises Bridges: Strengthening;Left;10 reps;Supine (5 sec hold) Amputee Exercises Gluteal Sets: Strengthening;Both;10 reps;Supine Towel Squeeze: Strengthening;Both;10 reps;Supine Hip Extension: Strengthening;Right;Sidelying;10 reps Hip ABduction/ADduction: Strengthening;Right;10 reps;Sidelying   Therapy/Group: Individual Therapy  Reginia Naas  Magda Kiel, PT 11/02/2020, 8:58 AM

## 2020-11-02 NOTE — Progress Notes (Addendum)
Nutrition Follow-up  DOCUMENTATION CODES:   Not applicable  INTERVENTION:   - Continue Ensure Max po BID, each supplement provides 150 kcal and 30 grams protein  - Continue Juven po BID, each packet provides 95 calories, 2.5 grams of protein, and 9.8 grams of carbohydrate; also contains L-arginine and L-glutamine, vitamin C, vitamin E, vitamin B-12, zinc, calcium, and calcium Beta-hydroxy-Beta-methylbutyrate to support wound healing  - Encourage adequate PO intake  NUTRITION DIAGNOSIS:   Increased nutrient needs related to post-op healing (s/p R AKA seondary to LE necrotizing fascitis) as evidenced by estimated needs.  Ongoing  GOAL:   Patient will meet greater than or equal to 90% of their needs  Progressing  MONITOR:   Labs,I & O's,Supplement acceptance,PO intake,Weight trends,Skin  REASON FOR ASSESSMENT:   Consult Diet education  ASSESSMENT:   65 year old male admitted to inpatient rehab following hospital admission for necrotizing fascitis of right lower extremity with myositis secondary to MRSA infection and incidental COVID-19 infection. 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   2/22 - wound VAC removed  Spoke with pt at bedside. Noted 95% completed breakfast meal tray in room. Pt reports that he normally eats more than this. Pt reports excellent appetite and eating well. He is drinking Ensure Max shakes and Juven supplements. He plans to continue to drink protein shakes after discharge. Encouraged pt to consume supplements with at least 20 grams of protein per shake. Pt reports that he normally consumes a shake with 30 grams of protein. RD encouraged continued adequate PO intake.  Meal Completion: 85-100%  Medications reviewed and include: vitamin C, colace, prozac, Juven, miralax, Ensure Max, senna  Labs reviewed: hemoglobin 9.2  UOP: 2025 ml x 24 hours  NUTRITION - FOCUSED PHYSICAL  EXAM:  Flowsheet Row Most Recent Value  Orbital Region No depletion  Upper Arm Region Mild depletion  Thoracic and Lumbar Region No depletion  Buccal Region No depletion  Temple Region No depletion  Clavicle Bone Region Mild depletion  Clavicle and Acromion Bone Region Mild depletion  Scapular Bone Region Unable to assess  Dorsal Hand Mild depletion  Patellar Region No depletion  Anterior Thigh Region Mild depletion  Posterior Calf Region Mild depletion  Edema (RD Assessment) Mild  [BLE]  Hair Reviewed  Eyes Reviewed  Mouth Reviewed  Skin Reviewed  Nails Reviewed       Diet Order:   Diet Order            Diet Carb Modified Fluid consistency: Thin; Room service appropriate? Yes  Diet effective now                 EDUCATION NEEDS:   Education needs have been addressed  Skin:  Skin Assessment:  Skin Integrity Issues: Incisions: right leg  Last BM:  11/02/20 type 6  Height:   Ht Readings from Last 1 Encounters:  10/20/20 5\' 11"  (1.803 m)    Weight:   Wt Readings from Last 1 Encounters:  10/05/20 116.9 kg    BMI:  Body mass index is 35.94 kg/m.  Estimated Nutritional Needs:   Kcal:  2500-2700  Protein:  140-160  Fluid:  >/= 2.5 L    Gustavus Bryant, MS, RD, LDN Inpatient Clinical Dietitian Please see AMiON for contact information.

## 2020-11-03 MED ORDER — FLUOXETINE HCL 10 MG PO CAPS: 10.0000 mg | ORAL_CAPSULE | Freq: Every day | ORAL | 0 refills | Status: DC

## 2020-11-03 MED ORDER — CYCLOBENZAPRINE HCL 5 MG PO TABS: 5.0000 mg | ORAL_TABLET | Freq: Three times a day (TID) | ORAL | 0 refills | Status: DC | PRN

## 2020-11-03 MED ORDER — BENZONATATE 200 MG PO CAPS: 200.0000 mg | ORAL_CAPSULE | Freq: Three times a day (TID) | ORAL | 0 refills | Status: DC | PRN

## 2020-11-03 MED ORDER — LOSARTAN POTASSIUM 25 MG PO TABS: 25.0000 mg | ORAL_TABLET | Freq: Every day | ORAL | 0 refills | Status: DC

## 2020-11-03 MED ORDER — DOCUSATE SODIUM 100 MG PO CAPS
100.0000 mg | ORAL_CAPSULE | Freq: Three times a day (TID) | ORAL | 0 refills | Status: DC
Start: 2020-11-03 — End: 2021-02-26

## 2020-11-03 MED ORDER — OXYCODONE HCL 10 MG PO TABS: 10.0000 mg | ORAL_TABLET | ORAL | 0 refills | Status: DC | PRN

## 2020-11-03 MED ORDER — JUVEN PO PACK: 1.0000 | PACK | Freq: Two times a day (BID) | ORAL | 0 refills | Status: DC

## 2020-11-03 MED ORDER — POLYETHYLENE GLYCOL 3350 17 G PO PACK: 17.0000 g | PACK | Freq: Two times a day (BID) | ORAL | 0 refills | Status: DC

## 2020-11-03 MED ORDER — SENNA 8.6 MG PO TABS: 2.0000 | ORAL_TABLET | Freq: Two times a day (BID) | ORAL | 0 refills | Status: DC

## 2020-11-03 MED ORDER — GABAPENTIN 400 MG PO CAPS: 400.0000 mg | ORAL_CAPSULE | Freq: Three times a day (TID) | ORAL | 0 refills | Status: DC

## 2020-11-03 NOTE — Progress Notes (Signed)
Inpatient Rehabilitation Care Coordinator Discharge Note  The overall goal for the admission was met for:   Discharge location: Yes-HOME WITH WIFE WHO CAN ASSIST-PT DIRECTS CARE  Length of Stay: No-14 DAYS-WAITED TWO DAYS FOR RAMP TO BE PUT INTO PLACE AT HOME  Discharge activity level: Yes-SUPERVISION-CGA LEVEL  Home/community participation: Yes  Services provided included: MD, RD, PT, OT, RN, CM, Pharmacy, Neuropsych and SW  Financial Services: Worker's Regulatory affairs officer offered to/list presented to:YES  Follow-up services arranged: Home Health: [employer] SET UP HHPT & OT, WILL ADD AIDE IF NEEDED, DME: [employer] SET UP HOSPITAL BED, TUB BENCH, ROLLING WALKER, WHEELCHAIR, BEDSIDE COMMODE AND RAMP and Patient/Family has no preference for HH/DME agencies DECIDED UPON OUTPATIENT PT AND OT ORDER FAXED TO [employer] CM TO ARRANGE  Comments (or additional information):WIFE [place]. AWARE TO CONTACT ANGIE [employer] CM IF QUESTIONS OR CONCERNS [phone]  Patient/Family verbalized understanding of follow-up arrangements: Yes  Individual responsible for coordination of the follow-up plan: [name]-WIFE [phone]  Confirmed correct DME delivered: [name] 11/03/2020    Krystopher Kuenzel, [place]

## 2020-11-03 NOTE — Discharge Instructions (Signed)
Inpatient Rehab Discharge Instructions  Zayin Valadez Discharge date and time:  11/04/19  Activities/Precautions/ Functional Status: Activity: no lifting, driving, or strenuous exercise till cleared by MD Diet: diabetic diet Wound Care: Wash area with soap and water. Pat dry and cover with stump sock.   Functional status:  ___ No restrictions     ___ Walk up steps independently _X__ 24/7 supervision/assistance   ___ Walk up steps with assistance ___ Intermittent supervision/assistance  ___ Bathe/dress independently ___ Walk with walker     _X__ Bathe/dress with assistance ___ Walk Independently    ___ Shower independently ___ Walk with assistance    ___ Shower with assistance _X__ No alcohol     ___ Return to work/school ________    Special Instructions:    COMMUNITY REFERRALS UPON DISCHARGE:    OUTPATIENT REHAB:   PT & OT                  Agency: [place] FOR CNA FAXED TO [employer]-[facility] IN CASE NEEDED  Medical Equipment/Items Ordered:HOSPITAL BED, WHEELCHAIR, BEDSIDE COMMODE, ROLLING WALKER AND TUB BENCH                                                 Agency/Supplier:[facility] [place]    My questions have been answered and I understand these instructions. I will adhere to these goals and the provided educational materials after my discharge from the hospital.  Patient/Caregiver Signature _______________________________ Date __________  Clinician Signature _______________________________________ Date __________  Please bring this form and your medication list with you to all your follow-up doctor's appointments.

## 2020-11-03 NOTE — Progress Notes (Incomplete)
Physical Therapy Session Note  Patient Details  Name: Andres [name] MRN: [id number] Date of Birth: 09-26-1955  Today's Date: 11/03/2020 PT Individual Time: 9379-0240 PT Individual Time Calculation (min): 30 min   Short Term Goals: Week 2:  PT Short Term Goal 1 (Week 2): STG=LTG due to ELOS  Skilled Therapeutic Interventions/Progress Updates:     Pt received seated in recliner and agrees to therapy. Reports some phantom pain. PT provides education and rest breaks to manage pain. Pt performs stand step transfer to [facility] with RW and mod(I). Pt self propels WC x150' independently. PT demos car transfer and pt performs with cues on WC positioning and sequencing. Pt performs without use of RW, instead performing lateral scoot/bump up, using frame of car and hand rests on WC for upper extremity support. Following, pt propels WC up ramp, backward, x5 reps. Pt propels WC back to room independently. Stand step transfer back to recliner with mod(I). Pt left seated in recliner with all needs within reach.  Therapy Documentation Precautions:  Precautions Precautions: Fall Precaution Comments: wound vac on R Restrictions Weight Bearing Restrictions: Yes RLE Weight Bearing: Non weight bearing General: PT Amount of Missed Time (min): 15 Minutes   Therapy/Group: Individual Therapy  Breck Coons, PT, DPT 11/03/2020, 4:14 PM

## 2020-11-03 NOTE — Plan of Care (Signed)
  Problem: RH Balance Goal: LTG Patient will maintain dynamic sitting balance (PT) Description: LTG:  Patient will maintain dynamic sitting balance with assistance during mobility activities (PT) Outcome: Completed/Met Goal: LTG Patient will maintain dynamic standing balance (PT) Description: LTG:  Patient will maintain dynamic standing balance with assistance during mobility activities (PT) Outcome: Completed/Met   Problem: Sit to Stand Goal: LTG:  Patient will perform sit to stand with assistance level (PT) Description: LTG:  Patient will perform sit to stand with assistance level (PT) Outcome: Completed/Met   Problem: RH Bed Mobility Goal: LTG Patient will perform bed mobility with assist (PT) Description: LTG: Patient will perform bed mobility with assistance, with/without cues (PT). Outcome: Completed/Met   Problem: RH Bed to Chair Transfers Goal: LTG Patient will perform bed/chair transfers w/assist (PT) Description: LTG: Patient will perform bed to chair transfers with assistance (PT). Outcome: Completed/Met   Problem: RH Car Transfers Goal: LTG Patient will perform car transfers with assist (PT) Description: LTG: Patient will perform car transfers with assistance (PT). Outcome: Completed/Met   Problem: RH Ambulation Goal: LTG Patient will ambulate in controlled environment (PT) Description: LTG: Patient will ambulate in a controlled environment, # of feet with assistance (PT). Outcome: Completed/Met Goal: LTG Patient will ambulate in home environment (PT) Description: LTG: Patient will ambulate in home environment, # of feet with assistance (PT). Outcome: Completed/Met   Problem: RH Wheelchair Mobility Goal: LTG Patient will propel w/c in controlled environment (PT) Description: LTG: Patient will propel wheelchair in controlled environment, # of feet with assist (PT) Outcome: Completed/Met Goal: LTG Patient will propel w/c in home environment (PT) Description: LTG:  Patient will propel wheelchair in home environment, # of feet with assistance (PT). Outcome: Completed/Met  Magda Kiel, PT

## 2020-11-03 NOTE — Progress Notes (Addendum)
Patient ID: Andres [name], male   DOB: February 04, 1956, 65 y.o.   MRN: [id number] According to Angie-[employer] CM ramp to be completed by today, so pt could discharge late afternoon. Pt & wife are aware and planning on this. Have updated MD,PA and therapy team of discharge today.  9:15 am Spoke with [employer] all equipment delivered except tub bench which pa can not use at this time. Ramp to be completed by 3:00-4:00 today. Wife transporting via car home today.

## 2020-11-03 NOTE — Discharge Summary (Signed)
Physician Discharge Summary  Patient ID: Andres [name] MRN: [id number] DOB/AGE: May 23, 1956 65 y.o.  Admit date: 10/20/2020 Discharge date: 11/03/2020  Discharge Diagnoses:  Principal Problem:   Above knee amputation of right lower extremity [facility]) Active Problems:   Essential hypertension   Impaired fasting glucose   Drug induced constipation   Post-operative pain   Reactive depression   Discharged Condition: Stable  Significant Diagnostic Studies: N/A   Labs:  Basic Metabolic Panel: BMP Latest Ref Rng & Units 10/30/2020 10/23/2020 10/21/2020  Glucose 70 - 99 mg/dL 95 97 108(H)  BUN 8 - 23 mg/dL 19 14 9   Creatinine 0.61 - 1.24 mg/dL 0.77 0.96 0.82  BUN/Creat Ratio 6 - 22 (calc) - - -  Sodium 135 - 145 mmol/L 137 135 136  Potassium 3.5 - 5.1 mmol/L 3.7 3.7 4.1  Chloride 98 - 111 mmol/L 105 103 106  CO2 22 - 32 mmol/L 22 23 22   Calcium 8.9 - 10.3 mg/dL 9.0 8.6(L) 8.5(L)    CBC: CBC Latest Ref Rng & Units 10/30/2020 10/26/2020 10/23/2020  WBC 4.0 - 10.5 K/uL 4.1 4.6 5.0  Hemoglobin 13.0 - 17.0 g/dL 9.2(L) 8.4(L) 7.9(L)  Hematocrit 39.0 - 52.0 % 29.6(L) 27.0(L) 23.5(L)  Platelets 150 - 400 K/uL 314 424(H) 477(H)    CBG: No results for input(s): GLUCAP in the last 168 hours.  Brief HPI:   Andres [name] is a 65 y.o. male with history of HTN, BCC was admitted on 02/03 22 with right extremity edema and erythema due to cellulitis.  He was found to be Covid positive but was asymptomatic and treated with remdesivir as well as Rocephin and daptomycin.  MRI of right extremity done due to progressive leukocytosis and showed mild ascites and anterior tibialis and soleus muscles.  Dr. Sharol Given was consulted for input and patient underwent 4 compartment regular extremity fasciotomies with I&D on 02/09 as well as repeat I&D on 02/11 which revealed progressive muscle necrosis.  He underwent R-AKA on 02/13 and wound cultures showed rare MRSA which was treated with 2-week course of Rocephin and  daptomycin.  Acute blood loss anemia treated with 2 units PRBCs as well as dose of Feraheme.  Prerenal azotemia resolved with IV fluids for hydration.  Therapy was ongoing and patient was noted to have ongoing issues with decreased mobility as well as fear and anxiety affecting his functional status.  CIR was recommended due to functional decline.   Hospital Course: Andres [name] was admitted to rehab 10/20/2020 for inpatient therapies to consist of PT  and OT at least three hours five days a week. Past admission physiatrist, therapy team and rehab RN have worked together to provide customized collaborative inpatient rehab.  3 times daily basis and was resumed with improvement in BP control.  Follow-up CBC shows H&H to be stable and he received second dose of Feraheme on 02/25.  He was maintained on carb modified diet during his stay and fasting blood sugars are within normal limits.  Fluoxetine was started to help with mood stabilization and team has been providing ego support for patient during his stay.   Neuropsychologist/Dr. Sima Matas has also been following patient and has worked with him on coping and adjustment issues. He has progressed to supervision at wheelchair level and will continue to receive further follow-up home health therapies after discharge.  Gabapentin was titrated to 40 mg 3 times daily with improvement in pain management.  Currently pain is controlled with limited use of oxycodone on as-needed basis.  He has made steady gains during his rehab stay and currently requires supervision at wheelchair level.  He will continue to receive further follow-up home health PT and OT after discharge.   Rehab course: During patient's stay in rehab weekly team conferences were held to monitor patient's progress, set goals and discuss barriers to discharge. At admission, patient required max assist with ADL tasks and mod assist with mobility.  He  has had improvement in activity tolerance,  balance, postural control as well as ability to compensate for deficits.  He is able to complete ADL tasks at modified independent level. He is able to perform squat pivot transfer with supervision and is able to ambulate up to 130 feet with rolling walker and supervision to contact-guard assist.  Family education was completed with wife.  Disposition: Home  Diet: Regular.   Special Instructions: 1. Wash incision with soap and water. Keep clean and dry. Cover with sock.   Discharge Instructions    Ambulatory referral to Physical Medicine Rehab   Complete by: As directed    1-2 weeks TC appt     Allergies as of 11/03/2020      Reactions   Niacin And Related Shortness Of Breath      Medication List    STOP taking these medications   ferrous sulfate 325 (65 FE) MG EC tablet   Fish Oil 1000 MG Caps   methocarbamol 500 MG tablet Commonly known as: ROBAXIN     TAKE these medications   acetaminophen 325 MG tablet Commonly known as: TYLENOL Take 2 tablets (650 mg total) by mouth every 8 (eight) hours.   aspirin 81 MG chewable tablet Chew 1 tablet (81 mg total) by mouth daily.   B-Complex/B-12 Tabs Take 1 tablet by mouth daily.   benzonatate 200 MG capsule Commonly known as: TESSALON Take 1 capsule (200 mg total) by mouth 3 (three) times daily as needed for cough.   cyclobenzaprine 5 MG tablet Commonly known as: FLEXERIL Take 1 tablet (5 mg total) by mouth 3 (three) times daily as needed for muscle spasms.   docusate sodium 100 MG capsule Commonly known as: COLACE Take 1 capsule (100 mg total) by mouth with breakfast, with lunch, and with evening meal. What changed: when to take this   FLUoxetine 10 MG capsule Commonly known as: PROZAC Take 1 capsule (10 mg total) by mouth daily.   gabapentin 400 MG capsule Commonly known as: NEURONTIN Take 1 capsule (400 mg total) by mouth 3 (three) times daily. What changed:   medication strength  how much to take   losartan  25 MG tablet Commonly known as: COZAAR Take 1 tablet (25 mg total) by mouth daily. What changed:   medication strength  how much to take   nutrition supplement (JUVEN) Pack Take 1 packet by mouth 2 (two) times daily between meals.   Oxycodone HCl 10 MG Tabs--Rx# 28 pills.  Take 1 tablet (10 mg total) by mouth every 4 (four) hours as needed. What changed: reasons to take this Notes to patient: Max 2 pills/day   polyethylene glycol 17 g packet Commonly known as: MIRALAX / GLYCOLAX Take 17 g by mouth 2 (two) times daily. What changed:   when to take this  reasons to take this   senna 8.6 MG Tabs tablet Commonly known as: SENOKOT Take 2 tablets (17.2 mg total) by mouth 2 (two) times daily.   Vitamin C 500 MG Chew Chew 1 tablet by mouth daily.  Follow-up Information    Jamse Arn, MD Follow up.   Specialty: Physical Medicine and Rehabilitation Why: office will call you with follow up appointment Contact information: [address] [place] [address] [phone]        Mackie Pai, PA-C. Call.   Specialties: Internal Medicine, Family Medicine Why: call for post hospital follow up Contact information: [address] [place] [address] [phone]        Newt Minion, MD Follow up.   Specialty: Orthopedic Surgery Why: for post op check Contact information: [place] [address] [phone]               Signed: Bary Leriche 11/06/2020, 5:15 PM

## 2020-11-03 NOTE — Progress Notes (Signed)
Physical Therapy Discharge Summary  Patient Details  Name: Andres [name] MRN: [id number] Date of Birth: 1956/06/09  Today's Date: 11/03/2020 PT Individual Time: 0835-0930 PT Individual Time Calculation (min): 55 min   Skilled Therapeutic Interventions/Progress Updates:  Patient in supine and reports slept better overnight.  Had to use sleep aid, but felt better.  Patient supine to sit independent.  Donned shoe at EOB with S.  Squat pivot to w/c with S.  Patient at sink for grooming, oral hygiene tasks w/c level independent.  Patient propelled w/c independently throughout session over level tile 150+'.  Patient negotiated 4 steps with rail and crutch with CGA.  Ambulated x 130' with RW and S to CGA.  PAtient sit to supine on mat independent for therex including R hip extension over bolster x 10 5 sec hold, L single leg bridge w/ 5 sec hold x 10, sidelying hip abduction and extension on R x 10 each.  Supine to sit independent.  Patient ambulated back to room x 120' with CGA with RW.  Left in recliner with call bell and needs in reach.    Patient has met 10 of 10 long term goals due to improved activity tolerance, improved balance, increased strength, decreased pain and ability to compensate for deficits.  Patient to discharge at a wheelchair level Supervision.   Patient's care partner is independent to provide the necessary physical assistance at discharge.  Reasons goals not met: Patient achieved all long term goals  Recommendation:  Patient will benefit from ongoing skilled PT services in home health setting to continue to advance safe functional mobility, address ongoing impairments in balance, gait, endurance, and minimize fall risk.  Equipment: wheelchair, walker, hospital bed all provided through worker's comp  Reasons for discharge: treatment goals met and discharge from hospital  Patient/family agrees with progress made and goals achieved: Yes  PT  Discharge Precautions/Restrictions Precautions Precautions: Fall Restrictions RLE Weight Bearing: Non weight bearing Pain Pain Assessment Pain Scale: 0-10 Pain Score: 7  Faces Pain Scale: Hurts a little bit Pain Type: Surgical pain Pain Location: Leg Pain Orientation: Right Pain Descriptors / Indicators: Discomfort Pain Onset: On-going Pain Intervention(s): Repositioned;Distraction Vision/Perception  Perception Perception: Within Functional Limits Praxis Praxis: Intact  Cognition Overall Cognitive Status: Within Functional Limits for tasks assessed Arousal/Alertness: Awake/alert Orientation Level: Oriented X4 Memory: Appears intact Awareness: Appears intact Problem Solving: Appears intact Safety/Judgment: Appears intact Sensation Sensation Light Touch: Appears Intact Coordination Gross Motor Movements are Fluid and Coordinated: Yes Fine Motor Movements are Fluid and Coordinated: Yes Motor  Motor Motor: Within Functional Limits Motor - Discharge Observations: phantom pain - mirror therapy effective  Mobility Bed Mobility Bed Mobility: Rolling Right;Rolling Left Rolling Right: Independent Rolling Left: Independent Left Sidelying to Sit: Independent Supine to Sit: Independent Sitting - Scoot to Edge of Bed: Independent Sit to Supine: Independent Sit to Sidelying Left: Independent Scooting to HOB: Independent Transfers Transfers: Programmer, applications Transfers: Supervision/Verbal cueing Transfer (Assistive device): Rolling walker Locomotion  Gait Ambulation: Yes Gait Assistance: Supervision/Verbal cueing Gait Distance (Feet): 130 Feet Assistive device: Rolling walker Gait Assistance Details: assist for safety, occasional cues for forward gaze Gait Gait: No Stairs / Additional Locomotion Stairs: Yes Stairs Assistance: Contact Guard/Touching assist Stair Management Technique: One rail Left;Forwards;With crutches Number of Stairs: 4 Height of  Stairs: 6 Ramp: Supervision/Verbal cueing Wheelchair Mobility Wheelchair Mobility: Yes Wheelchair Assistance: Independent with Camera operator: Both upper extremities Wheelchair Parts Management: Independent Distance: 300'  Trunk/Postural Assessment  Cervical  Assessment Cervical Assessment: Exceptions to [facility] (forward head) Thoracic Assessment Thoracic Assessment: Exceptions to [facility] (rounded shoulders) Lumbar Assessment Lumbar Assessment: Within Functional Limits Postural Control Postural Control: Within Functional Limits  Balance Balance Balance Assessed: Yes Static Sitting Balance Static Sitting - Level of Assistance: 7: Independent Dynamic Sitting Balance Dynamic Sitting - Level of Assistance: 6: Modified independent (Device/Increase time) Dynamic Sitting - Balance Activities: Lateral lean/weight shifting;Reaching across midline;Reaching for objects;Forward lean/weight shifting Static Standing Balance Static Standing - Balance Support: Right upper extremity supported;Left upper extremity supported Static Standing - Level of Assistance: 5: Stand by assistance Dynamic Standing Balance Dynamic Standing - Balance Support: Right upper extremity supported;Left upper extremity supported;Bilateral upper extremity supported Dynamic Standing - Level of Assistance: 5: Stand by assistance Dynamic Standing - Balance Activities: Forward lean/weight shifting;Reaching for objects;Reaching across midline Extremity Assessment      RLE Assessment Active Range of Motion (AROM) Comments: [facility] General Strength Comments: hip flexion 4+/5, hip abduction 4/5, extension 4-/5 LLE Assessment LLE Assessment: Within Functional Limits    Reginia Naas  Magda Kiel, PT 11/03/2020, 9:23 AM

## 2020-11-03 NOTE — Progress Notes (Addendum)
PROGRESS NOTE   Subjective/Complaints: Pain is better controlled. He plans to get mirror at home to continue mirror therapy. Systolic BP elevated and diastolic normal- educated regarding benefits of hih K+ foods  ROS: Denies CP, SOB, N/V/D, +phantom limb pain.  Objective:   No results found. No results for input(s): WBC, HGB, HCT, PLT in the last 72 hours. No results for input(s): NA, K, CL, CO2, GLUCOSE, BUN, CREATININE, CALCIUM in the last 72 hours.  Intake/Output Summary (Last 24 hours) at 11/03/2020 0851 Last data filed at 11/03/2020 0738 Gross per 24 hour  Intake 1420 ml  Output 900 ml  Net 520 ml        Physical Exam: Vital Signs Blood pressure (!) 144/80, pulse 66, temperature 97.8 F (36.6 C), resp. rate 16, height 5\' 11"  (1.803 m), SpO2 94 %.  Gen: no distress, normal appearing HEENT: oral mucosa pink and moist, NCAT Cardio: Reg rate Chest: normal effort, normal rate of breathing Abd: soft, non-distended Ext: no edema Psych: pleasant, normal affect Skin:  Right AKA with sutures CDI Psych: Normal mood.  Normal behavior. Musc: Right AKA with improving edema and tenderness.  Neuro: Alert Motor: Bilateral upper extremities, left lower extremity: 5/5 proximal distal, unchanged Right lower extremity: Hip flexion: 4-4+/5 (pain inhibition)  Assessment/Plan: 1. Functional deficits which require 3+ hours per day of interdisciplinary therapy in a comprehensive inpatient rehab setting.  Physiatrist is providing close team supervision and 24 hour management of active medical problems listed below.  Physiatrist and rehab team continue to assess barriers to discharge/monitor patient progress toward functional and medical goals  Care Tool:  Bathing    Body parts bathed by patient: Right upper leg,Left upper leg,Face,Front perineal area,Right arm,Left arm,Chest,Abdomen,Buttocks,Right lower leg   Body parts bathed by  helper: Right lower leg,Buttocks Body parts n/a: Left lower leg   Bathing assist Assist Level: Set up assist     Upper Body Dressing/Undressing Upper body dressing   What is the patient wearing?: Pull over shirt    Upper body assist Assist Level: Independent    Lower Body Dressing/Undressing Lower body dressing      What is the patient wearing?: Pants,Ace wrap/stump shrinker     Lower body assist Assist for lower body dressing: Set up assist     Toileting Toileting    Toileting assist Assist for toileting: Set up assist     Transfers Chair/bed transfer  Transfers assist     Chair/bed transfer assist level: Supervision/Verbal cueing Chair/bed transfer assistive device: Programmer, multimedia   Ambulation assist   Ambulation activity did not occur: Safety/medical concerns  Assist level: Contact Guard/Touching assist Assistive device: Walker-rolling Max distance: 140'   Walk 10 feet activity   Assist  Walk 10 feet activity did not occur: Safety/medical concerns  Assist level: Contact Guard/Touching assist Assistive device: Walker-rolling   Walk 50 feet activity   Assist Walk 50 feet with 2 turns activity did not occur: Safety/medical concerns  Assist level: Supervision/Verbal cueing Assistive device: Walker-rolling    Walk 150 feet activity   Assist Walk 150 feet activity did not occur: Safety/medical concerns  Assist level: Contact Guard/Touching assist Assistive device:  Walker-rolling    Walk 10 feet on uneven surface  activity   Assist Walk 10 feet on uneven surfaces activity did not occur: Safety/medical concerns   Assist level: Contact Guard/Touching assist Assistive device: Aeronautical engineer Will patient use wheelchair at discharge?: Yes Type of Wheelchair: Manual Wheelchair activity did not occur: Safety/medical concerns  Wheelchair assist level: Independent Max wheelchair distance: 400     Wheelchair 50 feet with 2 turns activity    Assist    Wheelchair 50 feet with 2 turns activity did not occur: Safety/medical concerns   Assist Level: Independent   Wheelchair 150 feet activity     Assist  Wheelchair 150 feet activity did not occur: Safety/medical concerns   Assist Level: Independent     Medical Problem List and Plan: 1.  Deficits with mobility, transfers, self-care secondary to right AKA due to necrotizing fasciitis  Dc home 2.  Antithrombotics: -DVT/anticoagulation:  Pharmaceutical: Continue Lovenox             -antiplatelet therapy: ASA 3. Pain Management: Oxycodone as needed. Added scheduled Tylenol q8H. Advised patient to take Oxycodone 30 minutes prior to AM therapy and he is agreeable.   Increase Gabapentin to 400mg  TID             Monitor with increased exertion, particularly for neuropathic pain 4. Mood: Team to provide ego support.   Fluoxetine started on 2/28 per patient preference, continue             -antipsychotic agents: N/A 5. Neuropsych: This patient is capable of making decisions on his own behalf.  Appreciate Neuropsych recs 6. Skin/Wound Care: Wound VAC DC'd              On vitamin C and Zinc to promote wound healing. 7. Fluids/Electrolytes/Nutrition: Monitor I/Os.             BMP within acceptable range on 2/28 8. HTN: Monitor BP tid and monitor for trends.   Elevated to 157/78- has been elevated- restart Losartan 25mg  daily.   3/3: much better controlled, continue Losartan.   3/4: systolic BP elevated: provided dietary education             Moderate increased mobility 9. Acute blood loss anemia:              Hemoglobin 9.2 on 2/28, monitor weekly  Hemoccult negative  Received 2 units on 02/13  Received Feraheme, repeat dose on 2/25 10. Recent Covid: Encourage pulmonary hygiene             --Continue Tessalon perles tid. 11. Pre-diabetes: Hgb A1C-5.9. Continue Carb modified diet.   Controlled on 2/28              Monitor increase mobility 12. Hyponatremia: Continue salt tabs for now.             Sodium 137 on 2/28 13.  Drug-induced constipation:   Adjust bowel meds as necessary  Bowel meds increased again on 2/25  Improving with meds 14. Disposition: awaiting ramp installation at home, discussed with team   >30 minutes spent in discharge of patient including review of medications and follow-up appointments, physical examination, and in answering all patient's questions   LOS: 14 days A FACE TO [place] 11/03/2020, 8:51 AM

## 2020-11-03 NOTE — Progress Notes (Signed)
Occupational Therapy Session Note  Patient Details  Name: Andres [name] MRN: [id number] Date of Birth: 02-Mar-1956  Today's Date: 11/03/2020 OT Individual Time: 1015-1110 OT Individual Time Calculation (min): 55 min    Short Term Goals: Week 1:  OT Short Term Goal 1 (Week 1): Pt will complete BSC transfers with CGA or less. OT Short Term Goal 1 - Progress (Week 1): Met OT Short Term Goal 2 (Week 1): Pt will be able to doff pants over hips on BSC with min A. OT Short Term Goal 2 - Progress (Week 1): Met OT Short Term Goal 3 (Week 1): Pt will be able to pull pants over hips with min -mod A on BSC. OT Short Term Goal 3 - Progress (Week 1): Met OT Short Term Goal 4 (Week 1): Pt will be able to sit to stand with RW with mod A. OT Short Term Goal 4 - Progress (Week 1): Met OT Short Term Goal 5 (Week 1): Pt will be able to tolerate standing with RW for 1 minute. OT Short Term Goal 5 - Progress (Week 1): Met  Skilled Therapeutic Interventions/Progress Updates:    1;1. Pt received in recliner excited to say he is going home and ramp is being installed as we speak. Pt requesting shower as pt will not be able to shower at home. Pt completes BADL at MOD I level in shower gathering all items. Only A was to wrap residual limb prior to shower to keep from getting wet. Pt completes bathing and dressing seated with good safety awareness and requests OT to remove obstacles out of way instead of attempting to unsafely remove himself. Exited session with pt seated in recliner, exit alarm on and call light in reach   Therapy Documentation Precautions:  Precautions Precautions: Fall Precaution Comments: wound vac on R Restrictions Weight Bearing Restrictions: Yes RLE Weight Bearing: Non weight bearing General:   Vital Signs: Therapy Vitals Temp: 97.8 F (36.6 C) Pulse Rate: 66 Resp: 16 BP: (!) 144/80 Patient Position (if appropriate): Lying Oxygen Therapy SpO2: 94 % Pain:    ADL: ADL Eating: Independent Grooming: Independent Where Assessed-Grooming: Sitting at sink Upper Body Bathing: Independent Where Assessed-Upper Body Bathing: Shower Lower Body Bathing: Supervision/safety Where Assessed-Lower Body Bathing: Shower Upper Body Dressing: Independent Where Assessed-Upper Body Dressing: Chair Lower Body Dressing: Supervision/safety Where Assessed-Lower Body Dressing: Chair Toileting: Supervision/safety Where Assessed-Toileting: Glass blower/designer: Close supervision Toilet Transfer Method: Counselling psychologist: Grab bars,Raised Counselling psychologist: Close supervision Social research officer, government Method: Heritage manager: Nurse, learning disability    Praxis   Exercises:   Other Treatments:     Therapy/Group: Individual Therapy  Tonny Branch 11/03/2020, 6:48 AM

## 2020-11-06 ENCOUNTER — Telehealth: Payer: Self-pay | Admitting: *Deleted

## 2020-11-06 NOTE — Progress Notes (Signed)
The [place] Substance controlled database was reviewed prior to prescribing narcotic pain medication to this patient. Oxycodone 10 mg # 28 pills RX and to use one every 4 hours prn pain.

## 2020-11-06 NOTE — Telephone Encounter (Signed)
Angie, NCM from [facility] comp called to report that the patient's wife had a really bad weekend and a [facility] aide is needed in the home for 8 hrs/day.  The order should be faxed to her fax # [phone].  Order faxed.

## 2020-11-07 ENCOUNTER — Telehealth: Payer: Self-pay | Admitting: Registered Nurse

## 2020-11-07 NOTE — Telephone Encounter (Signed)
Transitional Care call Transitional Questions answered by [name]  Patient name: Andres [name] DOB: 06/03/1956 1. Are you/is patient experiencing any problems since coming home? No a. Are there any questions regarding any aspect of care? No 2. Are there any questions regarding medications administration/dosing? No a. Are meds being taken as prescribed? Yes b. "Patient should review meds with caller to confirm" Medication List Reviewed. 3. Have there been any falls? No 4. Has Home Health been to the house and/or have they contacted you? Workers Comp: Angie Quick sent a message to [name] stating [facility] will be in contact with her.  a. If not, have you tried to contact them? See above b. Can we help you contact them? See above  5. Are bowels and bladder emptying properly? Yes a. Are there any unexpected incontinence issues? No b. If applicable, is patient following bowel/bladder programs? NA 6. Any fevers, problems with breathing, unexpected pain? No 7. Are there any skin problems or new areas of breakdown? No 8. Has the patient/family member arranged specialty MD follow up (ie cardiology/neurology/renal/surgical/etc.)?  He has a scheduled appointment with Dr Sharol Given, she will call PCP today to schedule HFU appointment, she states. a. Can we help arrange? No 9. Does the patient need any other services or support that we can help arrange? No 10. Are caregivers following through as expected in assisting the patient? Yes 11. Has the patient quit smoking, drinking alcohol, or using drugs as recommended? (                        )  Appointment date/time 11/16/2020  arrival time 10:40 for 11:00 appointment, with Bayard Hugger ANP-C. At [facility]

## 2020-11-13 ENCOUNTER — Ambulatory Visit (INDEPENDENT_AMBULATORY_CARE_PROVIDER_SITE_OTHER): Payer: BLUE CROSS/BLUE SHIELD | Admitting: Physician Assistant

## 2020-11-13 ENCOUNTER — Other Ambulatory Visit: Payer: Self-pay

## 2020-11-13 ENCOUNTER — Encounter: Payer: Self-pay | Admitting: Orthopedic Surgery

## 2020-11-13 VITALS — Ht 71.0 in | Wt 257.0 lb

## 2020-11-13 DIAGNOSIS — S78111A Complete traumatic amputation at level between right hip and knee, initial encounter: Secondary | ICD-10-CM

## 2020-11-13 DIAGNOSIS — Z89612 Acquired absence of left leg above knee: Secondary | ICD-10-CM

## 2020-11-13 NOTE — Progress Notes (Signed)
Office Visit Note   Patient: Andres [name]           Date of Birth: 1955/10/17           MRN: [id number] Visit Date: 11/13/2020              Requested by: Mackie Pai, PA-C [place],  [place] [address] PCP: Mackie Pai, PA-C  Chief Complaint  Patient presents with  . Right Leg - Follow-up    Right above knee amputation 10/15/2020      HPI: Patient is 1 month status post right above-knee amputation.  He is accompanied by a caregiver.  She is requesting a prescription as they will be using an outside prosthetic company not in [place].  Patient has also made arrangements to do physical therapy in [place] near his home he is overall doing quite well  Assessment & Plan: Visit Diagnoses: No diagnosis found.  Plan: Prescription was provided for right above-knee amputation prosthetic will follow up with [place] in 2 weeks  Follow-Up Instructions: No follow-ups on file.   Ortho Exam  Patient is alert, oriented, no adenopathy, well-dressed, normal affect, normal respiratory effort. Examination demonstrates well-healed surgical incision well opposed wound edges swelling is well controlled no ascending cellulitis or signs of infection Patient is a new right transfemoral amputee.  Patient's current comorbidities are not expected to impact the ability to function with the prescribed prosthesis. Patient verbally communicates a strong desire to use a prosthesis. Patient currently requires mobility aids to ambulate without a prosthesis.  Expects not to use mobility aids with a new prosthesis.  Patient is a K2 level ambulator that will use a prosthesis to walk around their home and the community over low level environmental barriers.    Imaging: No results found. No images are attached to the encounter.  Labs: Lab Results  Component Value Date   HGBA1C 5.9 (H) 10/18/2020   HGBA1C 5.9 (H) 11/08/2018   ESRSEDRATE 101 (H) 10/14/2020   CRP 8.6 (H)  10/14/2020   CRP 5.2 (H) 10/10/2020   CRP 10.4 (H) 10/09/2020   REPTSTATUS 10/18/2020 FINAL 10/13/2020   GRAMSTAIN  10/13/2020    FEW WBC PRESENT, PREDOMINANTLY PMN NO ORGANISMS SEEN    CULT  10/13/2020    No growth aerobically or anaerobically. Performed at [facility], [place] [address] [place], [place] [address]    LABORGA METHICILLIN RESISTANT STAPHYLOCOCCUS AUREUS 10/11/2020     Lab Results  Component Value Date   ALBUMIN 2.2 (L) 10/21/2020   ALBUMIN 2.2 (L) 10/10/2020   ALBUMIN 2.1 (L) 10/09/2020    No results found for: MG No results found for: VD25OH  No results found for: PREALBUMIN CBC EXTENDED Latest Ref Rng & Units 10/30/2020 10/26/2020 10/23/2020  WBC 4.0 - 10.5 K/uL 4.1 4.6 5.0  RBC 4.22 - 5.81 MIL/uL 3.23(L) 2.97(L) 2.71(L)  HGB 13.0 - 17.0 g/dL 9.2(L) 8.4(L) 7.9(L)  HCT 39.0 - 52.0 % 29.6(L) 27.0(L) 23.5(L)  PLT 150 - 400 K/uL 314 424(H) 477(H)  NEUTROABS 1.7 - 7.7 K/uL - 1.8 -  LYMPHSABS 0.7 - 4.0 K/uL - 1.8 -     Body mass index is 35.84 kg/m.  Orders:  No orders of the defined types were placed in this encounter.  No orders of the defined types were placed in this encounter.    Procedures: No procedures performed  Clinical Data: No additional findings.  ROS:  All other systems negative, except as  noted in the HPI. Review of Systems  Objective: Vital Signs: Ht 5\' 11"  (1.803 m)   Wt 257 lb (116.6 kg)   BMI 35.84 kg/m   Specialty Comments:  No specialty comments available.  PMFS History: Patient Active Problem List   Diagnosis Date Noted  . Reactive depression   . Drug induced constipation   . Post-operative pain   . 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[place]) 10/20/2020  . Unilateral AKA, right ([place])   . Acute blood loss anemia   . Prediabetes   . Necrotizing fasciitis of lower leg ([place])   . Abscess of right lower leg   . Cellulitis 10/05/2020  . Cellulitis of right lower extremity 10/05/2020  . COVID-19  virus infection 10/05/2020  . COVID   . Hyponatremia   . Chest pain 11/08/2018  . Accelerated hypertension 11/08/2018  . Encounter to establish care 07/09/2017  . Essential hypertension 08/16/2015  . Sensorineural hearing loss (SNHL), bilateral 08/16/2015  . Impaired fasting glucose 08/23/2014  . Obesity (BMI 30-39.9) 08/23/2014  . History of nonmelanoma skin cancer 07/15/2012  . Allergic rhinitis 10/24/2011  . Basal cell carcinoma of back 07/10/2011   Past Medical History:  Diagnosis Date  . Basal cell carcinoma   . Hypertension   . Reactive depression     Family History  Family history unknown: Yes    Past Surgical History:  Procedure Laterality Date  . AMPUTATION Right 10/15/2020   Procedure: AMPUTATION ABOVE KNEE;  Surgeon: Newt Minion, MD;  Location: [facility];  Service: Orthopedics;  Laterality: Right;  . FASCIOTOMY Right 10/11/2020   Procedure: RIGHT LEG FASCIOTOMIES AND DEBRIDE ULCER;  Surgeon: Newt Minion, MD;  Location: [facility];  Service: Orthopedics;  Laterality: Right;  . I & D EXTREMITY Right 10/13/2020   Procedure: REPEAT DEBRIDEMENT RIGHT LEG;  Surgeon: Newt Minion, MD;  Location: [facility];  Service: Orthopedics;  Laterality: Right;   Social History   Occupational History  . Not on file  Tobacco Use  . Smoking status: Former Research scientist (life sciences)  . Smokeless tobacco: Never Used  Vaping Use  . Vaping Use: Never used  Substance and Sexual Activity  . Alcohol use: Not Currently  . Drug use: Never  . Sexual activity: Yes

## 2020-11-16 ENCOUNTER — Encounter: Payer: BLUE CROSS/BLUE SHIELD | Admitting: Registered Nurse

## 2020-11-25 ENCOUNTER — Other Ambulatory Visit: Payer: Self-pay | Admitting: Physical Medicine and Rehabilitation

## 2020-11-26 ENCOUNTER — Other Ambulatory Visit: Payer: Self-pay | Admitting: Physical Medicine and Rehabilitation

## 2020-11-27 ENCOUNTER — Telehealth: Payer: Self-pay | Admitting: Medical

## 2020-11-27 ENCOUNTER — Ambulatory Visit: Payer: BLUE CROSS/BLUE SHIELD | Admitting: Orthopedic Surgery

## 2020-11-27 NOTE — Telephone Encounter (Signed)
Pt had recent amputation. I have seen notes from hospital. Offer follow up with me after he sees orthopedist etc. I have some forms from [employer]. Not sure about these forms? Maybe this forms needs to be filled out by orthopedist office.

## 2020-11-28 ENCOUNTER — Encounter: Payer: Self-pay | Admitting: Orthopedic Surgery

## 2020-11-28 ENCOUNTER — Ambulatory Visit (INDEPENDENT_AMBULATORY_CARE_PROVIDER_SITE_OTHER): Payer: BLUE CROSS/BLUE SHIELD | Admitting: Physician Assistant

## 2020-11-28 DIAGNOSIS — S78111A Complete traumatic amputation at level between right hip and knee, initial encounter: Secondary | ICD-10-CM

## 2020-11-28 DIAGNOSIS — Z89611 Acquired absence of right leg above knee: Secondary | ICD-10-CM

## 2020-11-28 MED ORDER — FLUOXETINE HCL 10 MG PO CAPS: 10.0000 mg | ORAL_CAPSULE | Freq: Every day | ORAL | 0 refills | Status: DC

## 2020-11-28 MED ORDER — GABAPENTIN 400 MG PO CAPS: 400.0000 mg | ORAL_CAPSULE | Freq: Three times a day (TID) | ORAL | 0 refills | Status: DC

## 2020-11-28 NOTE — Telephone Encounter (Signed)
Patient has a follow up with ortho today , do you want to see him still this week ?

## 2020-11-28 NOTE — Telephone Encounter (Signed)
2 week follow up made

## 2020-11-28 NOTE — Telephone Encounter (Signed)
2 weeks will be fine.

## 2020-11-28 NOTE — Progress Notes (Signed)
Office Visit Note   Patient: Andres [name]           Date of Birth: 09/07/1955           MRN: [id number] Visit Date: 11/28/2020              Requested by: Mackie Pai, PA-C [place],  [place] [address] PCP: Mackie Pai, PA-C  Chief Complaint  Patient presents with  . 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  He is in the process of being fitted for his prosthetic.  He is doing physical therapy.  He is pleased with his progress.  He currently is a K2 ambulator moving into K3.  Assessment & Plan: Visit Diagnoses: No diagnosis found.  Plan: Patient will continue with physical therapy and obtaining his prosthetic.  Would like to see him for final evaluation in 2 months  Follow-Up Instructions: No follow-ups on file.   Ortho Exam  Patient is alert, oriented, no adenopathy, well-dressed, normal affect, normal respiratory effort. Right above-knee amputation stump is completely healed he has 1 very small section of eschar.  There is no erythema no ascending cellulitis swelling is very well controlled with his shrinker no signs of infection skin is in excellent condition  Imaging: No results found. No images are attached to the encounter.  Labs: Lab Results  Component Value Date   HGBA1C 5.9 (H) 10/18/2020   HGBA1C 5.9 (H) 11/08/2018   ESRSEDRATE 101 (H) 10/14/2020   CRP 8.6 (H) 10/14/2020   CRP 5.2 (H) 10/10/2020   CRP 10.4 (H) 10/09/2020   REPTSTATUS 10/18/2020 FINAL 10/13/2020   GRAMSTAIN  10/13/2020    FEW WBC PRESENT, PREDOMINANTLY PMN NO ORGANISMS SEEN    CULT  10/13/2020    No growth aerobically or anaerobically. Performed at [facility], [place] [address] [place], [place] [address]    LABORGA METHICILLIN RESISTANT STAPHYLOCOCCUS AUREUS 10/11/2020     Lab Results  Component Value Date   ALBUMIN 2.2 (L) 10/21/2020   ALBUMIN 2.2 (L) 10/10/2020   ALBUMIN 2.1 (L)  10/09/2020    No results found for: MG No results found for: VD25OH  No results found for: PREALBUMIN CBC EXTENDED Latest Ref Rng & Units 10/30/2020 10/26/2020 10/23/2020  WBC 4.0 - 10.5 K/uL 4.1 4.6 5.0  RBC 4.22 - 5.81 MIL/uL 3.23(L) 2.97(L) 2.71(L)  HGB 13.0 - 17.0 g/dL 9.2(L) 8.4(L) 7.9(L)  HCT 39.0 - 52.0 % 29.6(L) 27.0(L) 23.5(L)  PLT 150 - 400 K/uL 314 424(H) 477(H)  NEUTROABS 1.7 - 7.7 K/uL - 1.8 -  LYMPHSABS 0.7 - 4.0 K/uL - 1.8 -     There is no height or weight on file to calculate BMI.  Orders:  No orders of the defined types were placed in this encounter.  Meds ordered this encounter  Medications  . gabapentin (NEURONTIN) 400 MG capsule    Sig: Take 1 capsule (400 mg total) by mouth 3 (three) times daily.    Dispense:  90 capsule    Refill:  0  . FLUoxetine (PROZAC) 10 MG capsule    Sig: Take 1 capsule (10 mg total) by mouth daily.    Dispense:  30 capsule    Refill:  0     Procedures: No procedures performed  Clinical Data: No additional findings.  ROS:  All other systems negative, except as noted in the HPI. Review of  Systems  Objective: Vital Signs: There were no vitals taken for this visit.  Specialty Comments:  No specialty comments available.  PMFS History: Patient Active Problem List   Diagnosis Date Noted  . Reactive depression   . Drug induced constipation   . Post-operative pain   . 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[place]) 10/20/2020  . Unilateral AKA, right ([place])   . Acute blood loss anemia   . Prediabetes   . Necrotizing fasciitis of lower leg ([place])   . Abscess of right lower leg   . Cellulitis 10/05/2020  . Cellulitis of right lower extremity 10/05/2020  . COVID-19 virus infection 10/05/2020  . COVID   . Hyponatremia   . Chest pain 11/08/2018  . Accelerated hypertension 11/08/2018  . Encounter to establish care 07/09/2017  . Essential hypertension 08/16/2015  . Sensorineural hearing loss (SNHL), bilateral  08/16/2015  . Impaired fasting glucose 08/23/2014  . Obesity (BMI 30-39.9) 08/23/2014  . History of nonmelanoma skin cancer 07/15/2012  . Allergic rhinitis 10/24/2011  . Basal cell carcinoma of back 07/10/2011   Past Medical History:  Diagnosis Date  . Basal cell carcinoma   . Hypertension   . Reactive depression     Family History  Family history unknown: Yes    Past Surgical History:  Procedure Laterality Date  . AMPUTATION Right 10/15/2020   Procedure: AMPUTATION ABOVE KNEE;  Surgeon: Newt Minion, MD;  Location: [facility];  Service: Orthopedics;  Laterality: Right;  . FASCIOTOMY Right 10/11/2020   Procedure: RIGHT LEG FASCIOTOMIES AND DEBRIDE ULCER;  Surgeon: Newt Minion, MD;  Location: [facility];  Service: Orthopedics;  Laterality: Right;  . I & D EXTREMITY Right 10/13/2020   Procedure: REPEAT DEBRIDEMENT RIGHT LEG;  Surgeon: Newt Minion, MD;  Location: [facility];  Service: Orthopedics;  Laterality: Right;   Social History   Occupational History  . Not on file  Tobacco Use  . Smoking status: Former Research scientist (life sciences)  . Smokeless tobacco: Never Used  Vaping Use  . Vaping Use: Never used  Substance and Sexual Activity  . Alcohol use: Not Currently  . Drug use: Never  . Sexual activity: Yes

## 2020-12-14 ENCOUNTER — Ambulatory Visit: Payer: BLUE CROSS/BLUE SHIELD | Admitting: Medical

## 2020-12-20 ENCOUNTER — Other Ambulatory Visit: Payer: Self-pay

## 2020-12-20 ENCOUNTER — Encounter: Payer: Self-pay | Admitting: Medical

## 2020-12-20 ENCOUNTER — Ambulatory Visit (HOSPITAL_BASED_OUTPATIENT_CLINIC_OR_DEPARTMENT_OTHER)
Admission: RE | Admit: 2020-12-20 | Discharge: 2020-12-20 | Disposition: A | Discharge status: Home / Self Care | Payer: BLUE CROSS/BLUE SHIELD | Source: Ambulatory Visit | Attending: Medical | Admitting: Medical

## 2020-12-20 ENCOUNTER — Ambulatory Visit: Payer: BLUE CROSS/BLUE SHIELD | Admitting: Medical

## 2020-12-20 VITALS — BP 126/88 | HR 75 | Temp 98.4°F | Resp 18 | Ht 71.0 in | Wt 204.0 lb

## 2020-12-20 DIAGNOSIS — F329 Major depressive disorder, single episode, unspecified: Secondary | ICD-10-CM | POA: Diagnosis not present

## 2020-12-20 DIAGNOSIS — M898X6 Other specified disorders of bone, lower leg: Secondary | ICD-10-CM | POA: Insufficient documentation

## 2020-12-20 DIAGNOSIS — L089 Local infection of the skin and subcutaneous tissue, unspecified: Secondary | ICD-10-CM

## 2020-12-20 DIAGNOSIS — R600 Localized edema: Secondary | ICD-10-CM | POA: Diagnosis not present

## 2020-12-20 DIAGNOSIS — M79662 Pain in left lower leg: Secondary | ICD-10-CM | POA: Diagnosis not present

## 2020-12-20 DIAGNOSIS — Z8739 Personal history of other diseases of the musculoskeletal system and connective tissue: Secondary | ICD-10-CM | POA: Diagnosis not present

## 2020-12-20 DIAGNOSIS — Z872 Personal history of diseases of the skin and subcutaneous tissue: Secondary | ICD-10-CM | POA: Diagnosis not present

## 2020-12-20 DIAGNOSIS — I10 Essential (primary) hypertension: Secondary | ICD-10-CM | POA: Diagnosis not present

## 2020-12-20 LAB — CBC WITH DIFFERENTIAL/PLATELET
Basophils Absolute: 0 10*3/uL (ref 0.0–0.1)
Basophils Relative: 0.8 % (ref 0.0–3.0)
Eosinophils Absolute: 0.2 10*3/uL (ref 0.0–0.7)
Eosinophils Relative: 4.3 % (ref 0.0–5.0)
HCT: 39 % (ref 39.0–52.0)
Hemoglobin: 12.9 g/dL — ABNORMAL LOW (ref 13.0–17.0)
Lymphocytes Relative: 42.7 % (ref 12.0–46.0)
Lymphs Abs: 2.1 10*3/uL (ref 0.7–4.0)
MCHC: 33 g/dL (ref 30.0–36.0)
MCV: 88.1 fl (ref 78.0–100.0)
Monocytes Absolute: 0.3 10*3/uL (ref 0.1–1.0)
Monocytes Relative: 6 % (ref 3.0–12.0)
Neutro Abs: 2.2 10*3/uL (ref 1.4–7.7)
Neutrophils Relative %: 46.2 % (ref 43.0–77.0)
Platelets: 224 10*3/uL (ref 150.0–400.0)
RBC: 4.43 Mil/uL (ref 4.22–5.81)
RDW: 15.4 % (ref 11.5–15.5)
WBC: 4.8 10*3/uL (ref 4.0–10.5)

## 2020-12-20 MED ORDER — DOXYCYCLINE HYCLATE 100 MG PO TABS: 100.0000 mg | ORAL_TABLET | Freq: Two times a day (BID) | ORAL | 0 refills | Status: DC

## 2020-12-20 NOTE — Patient Instructions (Addendum)
Recent left lower extremity small wound with some faint redness and tenderness.  History of right lower extremity amputation after infection get out of control.  Will proceed with caution.  Did prescribe doxycycline antibiotic and got a culture of a minimal abrasion area.  We will see if culture grows out any bacteria.  I do want you to get CBC today to evaluate action fighting cell count.  Also we will go ahead and get left tibia/fibula x-ray.   Would asked that you follow-up on Friday to make sure area is improving.  If any dramatic signs and symptoms change then recommend ED evaluation.  History of some reactive depression.  Glad to hear that your mood is improving.  Continue Prozac every other day present.  Blood pressure well controlled today.   Follow up Friday or as needed

## 2020-12-20 NOTE — Progress Notes (Signed)
Subjective:    Patient ID: [place], male    DOB: May 14, 1956, 65 y.o.   MRN: [id number]  HPI   Pt in for follow up.  Admit date: 10/05/2020 Discharge date: 10/20/2020  Admitted From: Home Disposition: Acute inpatient rehab  Recommendations for Outpatient Follow-up:  1. Follow up with PCP in 1-2 weeks after discharge from the hospital 2. Please recheck CBC in 2 days to ensure stabilization. 3. Orthopedics to follow-up regarding wound care.  Discharge summary:  65 year old gentleman with history of hypertension presented to the hospital with right lower extremity cellulitis, failed outpatient therapy with antibiotics on 10/05/20. MRI showed diffuse subcutaneous soft tissue swelling and edema with no evidence of abscess. Treated with Rocephin and daptomycin. 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  Assessment & Plan:  Principal Problem: Cellulitis of right lower extremity Active Problems: Essential hypertension Cellulitis Hyponatremia COVID-19 virus infection Abscess of right lower leg Necrotizing fasciitis of lower leg (HCC)  # Necrotizing fasciitis of the right lower extremity with myositis secondary to MRSA infection: Treated with broad-spectrum antibiotics 2/3-2/9.  Did not improve. 2/9 right leg fasciotomy for compartment syndrome and excision of soft tissue muscle and fascia. 2/11 repeat debridement, found to have multiple necrotic muscles and nonsalvageable limb. 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 Blood cultures 1/2 with Corynebacterium likely contaminant Wound culture with rare MRSA. Rocephin and daptomycin until 2/16 to complete 2 weeks of therapy. Currently pain controlled with oral oxycodone, gabapentin. Wound VAC management as per surgery.  They will continue to follow. Stable to transfer to acute inpatient rehab.  #  Incidental COVID-19 infection: Completed IV remdesivir. Currently off isolation.  # Hypovolemic hyponatremia: Improved.  # Acute renal failure: Improved.  # Essential hypertension: Blood pressure stable.  # Acute blood loss anemia: Hemoglobin was less than 7. Received 2 units PRBC on 2/13.  Hemoglobin remains 7-8 range since then.  No active bleeding. Hemoglobin 7 on 2/18.  1 unit of Feraheme given. We will put patient on scheduled iron supplements. Please recheck hemoglobin in 2 to 3 days to ensure stabilization.  Patient is clinically stable to transfer to acute inpatient rehab to continue aggressive and multidisciplinary rehab.  Patient had rare MRSA infection on his leg wound, nasal swab was negative.  Blood cultures were without MRSA. He is on contact isolation, however is not clinically significant.  His infected tissue was removed to clean margin. Contact precaution for MRSA as per receiving institutions policy.   Discharge Diagnoses:  Principal Problem:   Cellulitis of right lower extremity Active Problems:   Essential hypertension   Cellulitis   Hyponatremia   COVID-19 virus infection   Abscess of right lower leg   Necrotizing fasciitis of lower leg (HCC)   Pt tells me he has some phantom pain. Minimal discomfort. Pt is on gabapentin for nerve pain.  Pt mood decreased post surgery. He was placed on prozac. Now using every other day. Mood better. Eventually he has goal of getting off prozac.  Pt has new left lower pretibial redness to med pretibial area on Thursday. Next day 2 linear abrasions. No trauma or scrape that he nows. No fever, no chills, no sweats.   On review pt had mrsa grow out on 10-16-2020.   Review of Systems  Constitutional: Negative for fatigue.  Respiratory: Negative for cough, choking, shortness of breath and wheezing.   Cardiovascular: Negative for chest pain and palpitations.  Gastrointestinal: Negative for abdominal pain.   Musculoskeletal:       See hpi.  Skin:       See hpi.  Neurological: Negative for dizziness, weakness, numbness and headaches.  Hematological: Negative for adenopathy. Does not bruise/bleed easily.  Psychiatric/Behavioral: Negative for behavioral problems and confusion.    Past Medical History:  Diagnosis Date  . Basal cell carcinoma   . Hypertension   . Reactive depression      Social History   Socioeconomic History  . Marital status: Married    Spouse name: Not on file  . Number of children: Not on file  . Years of education: Not on file  . Highest education level: Not on file  Occupational History  . Not on file  Tobacco Use  . Smoking status: Former Research scientist (life sciences)  . Smokeless tobacco: Never Used  Vaping Use  . Vaping Use: Never used  Substance and Sexual Activity  . Alcohol use: Not Currently  . Drug use: Never  . Sexual activity: Yes  Other Topics Concern  . Not on file  Social History Narrative  . Not on file   Social Determinants of Health   Financial Resource Strain: Not on file  Food Insecurity: Not on file  Transportation Needs: Not on file  Physical Activity: Not on file  Stress: Not on file  Social Connections: Not on file  Intimate Partner Violence: Not on file    Past Surgical History:  Procedure Laterality Date  . AMPUTATION Right 10/15/2020   Procedure: AMPUTATION ABOVE KNEE;  Surgeon: Newt Minion, MD;  Location: [facility];  Service: Orthopedics;  Laterality: Right;  . FASCIOTOMY Right 10/11/2020   Procedure: RIGHT LEG FASCIOTOMIES AND DEBRIDE ULCER;  Surgeon: Newt Minion, MD;  Location: [facility];  Service: Orthopedics;  Laterality: Right;  . I & D EXTREMITY Right 10/13/2020   Procedure: REPEAT DEBRIDEMENT RIGHT LEG;  Surgeon: Newt Minion, MD;  Location: [facility];  Service: Orthopedics;  Laterality: Right;    Family History  Family history unknown: Yes    Allergies  Allergen Reactions  . Niacin And Related Shortness Of Breath    Current  Outpatient Medications on File Prior to Visit  Medication Sig Dispense Refill  . acetaminophen (TYLENOL) 325 MG tablet Take 2 tablets (650 mg total) by mouth every 8 (eight) hours.    . Ascorbic Acid (VITAMIN C) 500 MG CHEW Chew 1 tablet by mouth daily.    Marland Kitchen aspirin 81 MG chewable tablet Chew 1 tablet (81 mg total) by mouth daily.    . B Complex Vitamins (B-COMPLEX/B-12) TABS Take 1 tablet by mouth daily.    . benzonatate (TESSALON) 200 MG capsule Take 1 capsule (200 mg total) by mouth 3 (three) times daily as needed for cough. 20 capsule 0  . cyclobenzaprine (FLEXERIL) 5 MG tablet Take 1 tablet (5 mg total) by mouth 3 (three) times daily as needed for muscle spasms. 60 tablet 0  . docusate sodium (COLACE) 100 MG capsule Take 1 capsule (100 mg total) by mouth with breakfast, with lunch, and with evening meal. 90 capsule 0  . FLUoxetine (PROZAC) 10 MG capsule Take 1 capsule (10 mg total) by mouth daily. 30 capsule 0  . gabapentin (NEURONTIN) 400 MG capsule Take 1 capsule (400 mg total) by mouth 3 (three) times daily. 90 capsule 0  . losartan (COZAAR) 25 MG tablet Take 1 tablet (25 mg total) by mouth daily. 30 tablet 0  . nutrition supplement, JUVEN, (  JUVEN) PACK Take 1 packet by mouth 2 (two) times daily between meals. 60 each 0  . Oxycodone HCl 10 MG TABS Take 1 tablet (10 mg total) by mouth every 4 (four) hours as needed. 28 tablet 0  . polyethylene glycol (MIRALAX / GLYCOLAX) 17 g packet Take 17 g by mouth 2 (two) times daily. 90 each 0  . senna (SENOKOT) 8.6 MG TABS tablet Take 2 tablets (17.2 mg total) by mouth 2 (two) times daily. 120 tablet 0   No current facility-administered medications on file prior to visit.    BP 126/88   Pulse 75   Temp 98.4 F (36.9 C)   Resp 18   Ht 5\' 11"  (1.803 m)   Wt 204 lb (92.5 kg)   SpO2 95%   BMI 28.45 kg/m      Objective:   Physical Exam   General- No acute distress. Pleasant patient. Neck- Full range of motion, no jvd Lungs- Clear, even  and unlabored. Heart- regular rate and rhythm. Neurologic- CNII- XII grossly intact.  Rt lower ext- above knee amputation. Left lower ext- distal 2/3 tibia area small 2.5 cm faint abrasion linear pattern. Faint pinkiness around edges. No dc. Mild tender. No calf swelling. Negative homans sign.     Assessment & Plan:  Recent left lower extremity small wound with some faint redness and tenderness.  History of right lower extremity amputation after infection get out of control.  Will proceed with caution.  Did prescribe doxycycline antibiotic and got a culture of a minimal abrasion area.  We will see if culture grows out any bacteria.  I do want you to get CBC today to evaluate action fighting cell count.  Also we will go ahead and get left tibia/fibula x-ray.   Would asked that you follow-up on Friday to make sure area is improving.  If any dramatic signs and symptoms change then recommend ED evaluation.  History of some reactive depression.  Glad to hear that your mood is improving.  Continue Prozac every other day present.  Blood pressure well controlled today.   Follow up Friday or as needed

## 2020-12-21 ENCOUNTER — Encounter: Payer: Self-pay | Admitting: Medical

## 2020-12-21 ENCOUNTER — Other Ambulatory Visit (INDEPENDENT_AMBULATORY_CARE_PROVIDER_SITE_OTHER): Payer: BLUE CROSS/BLUE SHIELD

## 2020-12-21 DIAGNOSIS — L089 Local infection of the skin and subcutaneous tissue, unspecified: Secondary | ICD-10-CM

## 2020-12-21 LAB — C-REACTIVE PROTEIN: CRP: <1 mg/dL (ref 0.5–20.0)

## 2020-12-21 LAB — SEDIMENTATION RATE: Sed Rate: 61 mm/hr — ABNORMAL HIGH (ref 0–20)

## 2020-12-21 NOTE — Addendum Note (Signed)
Addended by: Anabel Halon on: 12/21/2020 01:46 PM   Modules accepted: Orders

## 2020-12-21 NOTE — Telephone Encounter (Signed)
Patient called , lab appointment made for 12/21/20 at 3:00 pm

## 2020-12-21 NOTE — Addendum Note (Signed)
Addended by: Kelle Darting A on: 12/21/2020 03:01 PM   Modules accepted: Orders

## 2020-12-22 ENCOUNTER — Ambulatory Visit: Payer: BLUE CROSS/BLUE SHIELD | Admitting: Medical

## 2020-12-22 ENCOUNTER — Other Ambulatory Visit: Payer: Self-pay

## 2020-12-22 VITALS — BP 112/76 | HR 71 | Resp 18 | Ht 71.0 in | Wt 204.0 lb

## 2020-12-22 DIAGNOSIS — L089 Local infection of the skin and subcutaneous tissue, unspecified: Secondary | ICD-10-CM

## 2020-12-22 NOTE — Progress Notes (Signed)
Subjective:    Patient ID: Andres [name], male    DOB: May 31, 1956, 65 y.o.   MRN: [id number]  HPI Pt in for follow up on left lower extremity small area of probable early skin infection. No fever, no chills or sweats. No obvious fatigue. Pt wbc was normal, c reactive protein was normal. His sed rate was elevated. Pt is on doxycycline. Tolerating antibiotic well. Xray of leg showed no osteomyelitis but some soft tissue edema. Wound culture was negative though not ideal culture since no dc was present.  Pt explains that on his rt leg had slight scrape.       Review of Systems  Constitutional: Negative for chills, fatigue and fever.  Respiratory: Negative for cough, chest tightness, shortness of breath and wheezing.   Cardiovascular: Negative for chest pain and palpitations.  Musculoskeletal:       See hpi  Skin:       See hpi.  Neurological: Negative for dizziness, facial asymmetry and headaches.  Hematological: Negative for adenopathy. Does not bruise/bleed easily.  Psychiatric/Behavioral: Negative for behavioral problems and decreased concentration.    Past Medical History:  Diagnosis Date  . Basal cell carcinoma   . Hypertension   . Reactive depression      Social History   Socioeconomic History  . Marital status: Married    Spouse name: Not on file  . Number of children: Not on file  . Years of education: Not on file  . Highest education level: Not on file  Occupational History  . Not on file  Tobacco Use  . Smoking status: Former Research scientist (life sciences)  . Smokeless tobacco: Never Used  Vaping Use  . Vaping Use: Never used  Substance and Sexual Activity  . Alcohol use: Not Currently  . Drug use: Never  . Sexual activity: Yes  Other Topics Concern  . Not on file  Social History Narrative  . Not on file   Social Determinants of Health   Financial Resource Strain: Not on file  Food Insecurity: Not on file  Transportation Needs: Not on file  Physical Activity: Not on  file  Stress: Not on file  Social Connections: Not on file  Intimate Partner Violence: Not on file    Past Surgical History:  Procedure Laterality Date  . AMPUTATION Right 10/15/2020   Procedure: AMPUTATION ABOVE KNEE;  Surgeon: Newt Minion, MD;  Location: [place];  Service: Orthopedics;  Laterality: Right;  . FASCIOTOMY Right 10/11/2020   Procedure: RIGHT LEG FASCIOTOMIES AND DEBRIDE ULCER;  Surgeon: Newt Minion, MD;  Location: [place];  Service: Orthopedics;  Laterality: Right;  . I & D EXTREMITY Right 10/13/2020   Procedure: REPEAT DEBRIDEMENT RIGHT LEG;  Surgeon: Newt Minion, MD;  Location: [place];  Service: Orthopedics;  Laterality: Right;    Family History  Family history unknown: Yes    Allergies  Allergen Reactions  . Niacin And Related Shortness Of Breath    Current Outpatient Medications on File Prior to Visit  Medication Sig Dispense Refill  . Ascorbic Acid (VITAMIN C) 500 MG CHEW Chew 1 tablet by mouth daily.    Marland Kitchen aspirin 81 MG chewable tablet Chew 1 tablet (81 mg total) by mouth daily.    . B Complex Vitamins (B-COMPLEX/B-12) TABS Take 1 tablet by mouth daily.    Marland Kitchen doxycycline (VIBRA-TABS) 100 MG tablet Take 1 tablet (100 mg total) by mouth 2 (two) times daily. 20 tablet 0  . FLUoxetine (PROZAC) 10 MG capsule  Take 1 capsule (10 mg total) by mouth daily. 30 capsule 0  . gabapentin (NEURONTIN) 400 MG capsule Take 1 capsule (400 mg total) by mouth 3 (three) times daily. 90 capsule 0  . losartan (COZAAR) 25 MG tablet Take 1 tablet (25 mg total) by mouth daily. 30 tablet 0  . acetaminophen (TYLENOL) 325 MG tablet Take 2 tablets (650 mg total) by mouth every 8 (eight) hours.    . benzonatate (TESSALON) 200 MG capsule Take 1 capsule (200 mg total) by mouth 3 (three) times daily as needed for cough. 20 capsule 0  . cyclobenzaprine (FLEXERIL) 5 MG tablet Take 1 tablet (5 mg total) by mouth 3 (three) times daily as needed for muscle spasms. 60 tablet 0  . docusate sodium  (COLACE) 100 MG capsule Take 1 capsule (100 mg total) by mouth with breakfast, with lunch, and with evening meal. 90 capsule 0  . nutrition supplement, JUVEN, (JUVEN) PACK Take 1 packet by mouth 2 (two) times daily between meals. 60 each 0  . Oxycodone HCl 10 MG TABS Take 1 tablet (10 mg total) by mouth every 4 (four) hours as needed. 28 tablet 0  . polyethylene glycol (MIRALAX / GLYCOLAX) 17 g packet Take 17 g by mouth 2 (two) times daily. 90 each 0  . senna (SENOKOT) 8.6 MG TABS tablet Take 2 tablets (17.2 mg total) by mouth 2 (two) times daily. 120 tablet 0   No current facility-administered medications on file prior to visit.    BP 112/76   Pulse 71   Resp 18   Ht 5\' 11"  (1.803 m)   Wt 204 lb (92.5 kg)   SpO2 96%   BMI 28.45 kg/m       Objective:   Physical Exam  General- No acute distress. Pleasant patient. Neck- Full range of motion, no jvd Lungs- Clear, even and unlabored. Heart- regular rate and rhythm. Neurologic- CNII- XII grossly intact.   Rt lower ext- above knee amputation. Left lower ext- distal 2/3 tibia area small 2.5 cm faint abrasion linear pattern. Less pinkiness around edges today. No dc. Mild tender on above abrasion. No calf swelling. Negative homans sign. Skin has drier appearance      Assessment & Plan:  Your left lower extremity area appears about the same with possible slight minimal improvement.  Still tender in area above abrasion.  Labs and x-rays came back overall negative.  However your sed rate was elevated and x-ray did mention some minimal soft tissue edema.  Cultures so far came showing no growth.  In light of your history of severe complications right lower extremity decided to go ahead and try to get you in with wound care early next week/Monday if possible.  Continue doxycycline antibiotic.  If you have worsening or changing signs symptoms as we discussed then recommend ED evaluation over the weekend.  Follow-up date to be determined  depending on when you get to see wound care.  Also I think if you see wound care they might consider infectious disease consult.  Discussed with referral staff call wound care today  Mackie Pai, PA-C

## 2020-12-22 NOTE — Patient Instructions (Addendum)
Your left lower extremity area appears about the same with possible slight minimal improvement.  Still tender in area above abrasion.  Labs and x-rays came back overall negative.  However your sed rate was elevated and x-ray did mention some minimal soft tissue edema.  Cultures so far came showing no growth.  In light of your history of severe complications right lower extremity decided to go ahead and try to get you in with wound care early next week/Monday if possible.  Continue doxycycline antibiotic.  If you have worsening or changing signs symptoms as we discussed then recommend ED evaluation over the weekend.  Follow-up date to be determined depending on when you get to see wound care.  Also I think if you see wound care they might consider infectious disease consult.

## 2020-12-23 ENCOUNTER — Encounter: Payer: Self-pay | Admitting: Medical

## 2020-12-23 LAB — WOUND CULTURE
GRAM STAIN:: NONE SEEN
MICRO NUMBER:: 11792258
RESULT:: NO GROWTH
SPECIMEN QUALITY:: ADEQUATE

## 2020-12-26 ENCOUNTER — Other Ambulatory Visit: Payer: Self-pay

## 2020-12-26 ENCOUNTER — Encounter (HOSPITAL_BASED_OUTPATIENT_CLINIC_OR_DEPARTMENT_OTHER): Payer: BLUE CROSS/BLUE SHIELD | Attending: Internal Medicine | Admitting: Internal Medicine

## 2020-12-26 DIAGNOSIS — L03116 Cellulitis of left lower limb: Secondary | ICD-10-CM | POA: Diagnosis not present

## 2020-12-26 DIAGNOSIS — S78111A Complete traumatic amputation at level between right hip and knee, initial encounter: Secondary | ICD-10-CM

## 2020-12-26 DIAGNOSIS — M726 Necrotizing fasciitis: Secondary | ICD-10-CM | POA: Diagnosis not present

## 2020-12-26 DIAGNOSIS — Z89611 Acquired absence of right leg above knee: Secondary | ICD-10-CM | POA: Insufficient documentation

## 2020-12-26 DIAGNOSIS — Z87891 Personal history of nicotine dependence: Secondary | ICD-10-CM | POA: Diagnosis not present

## 2020-12-26 NOTE — Progress Notes (Signed)
WINSON, EICHORN ([id number]) Visit Report for 12/26/2020 Allergy List Details Patient Name: Date of Service: Andres [name], Andres [name] 12/26/2020 9:30 A M Medical Record Number: [id number] Patient Account Number: [id number] Date of Birth/Sex: Treating RN: [birth year] (65 y.o. Male) Baruch Gouty Primary Care Aylla Huffine: Mackie Pai Other Clinician: Referring Kailynn Satterly: Treating Malani Lees/Extender: Lamar Benes in Treatment: 0 Allergies Active Allergies niacin Reaction: shortness of breath Allergy Notes Electronic Signature(s) Signed: 12/26/2020 6:28:10 PM By: Baruch Gouty RN, BSN Entered By: Baruch Gouty on 12/26/2020 10:02:26 -------------------------------------------------------------------------------- Arrival Information Details Patient Name: Date of Service: Andres [name], Andres [name] 12/26/2020 9:30 A M Medical Record Number: [id number] Patient Account Number: [id number] Date of Birth/Sex: Treating RN: Jul 02, 1956 (64 y.o. Male) Baruch Gouty Primary Care Natacia Chaisson: Mackie Pai Other Clinician: Referring Aideen Fenster: Treating Clarisse Rodriges/Extender: Lamar Benes in Treatment: 0 Visit Information Patient Arrived: Wheel Chair Arrival Time: 09:49 Accompanied By: self Transfer Assistance: None Patient Identification Verified: Yes Secondary Verification Process Completed: Yes Patient Requires Transmission-Based Precautions: No Patient Has Alerts: No Electronic Signature(s) Signed: 12/26/2020 6:28:10 PM By: Baruch Gouty RN, BSN Entered By: Baruch Gouty on 12/26/2020 10:01:05 -------------------------------------------------------------------------------- Clinic Level of Care Assessment Details Patient Name: Date of Service: Andres [name], Andres [name] 12/26/2020 9:30 A M Medical Record Number: [id number] Patient Account Number: [id number] Date of Birth/Sex: Treating RN: 1955/11/14 (64 y.o. Male) Baruch Gouty Primary Care Sharayah Renfrow:  Mackie Pai Other Clinician: Referring Sukhmani Fetherolf: Treating Keavon Sensing/Extender: Lamar Benes in Treatment: 0 Clinic Level of Care Assessment Items TOOL 2 Quantity Score []  - 0 Use when only an EandM is performed on the INITIAL visit ASSESSMENTS - Nursing Assessment / Reassessment X- 1 20 General Physical Exam (combine w/ comprehensive assessment (listed just below) when performed on new pt. evals) X- 1 25 Comprehensive Assessment (HX, ROS, Risk Assessments, Wounds Hx, etc.) ASSESSMENTS - Wound and Skin A ssessment / Reassessment []  - 0 Simple Wound Assessment / Reassessment - one wound []  - 0 Complex Wound Assessment / Reassessment - multiple wounds X- 1 10 Dermatologic / Skin Assessment (not related to wound area) ASSESSMENTS - Ostomy and/or Continence Assessment and Care []  - 0 Incontinence Assessment and Management []  - 0 Ostomy Care Assessment and Management (repouching, etc.) PROCESS - Coordination of Care X - Simple Patient / Family Education for ongoing care 1 15 []  - 0 Complex (extensive) Patient / Family Education for ongoing care X- 1 10 Staff obtains Programmer, systems, Records, T Results / Process Orders est []  - 0 Staff telephones HHA, Nursing Homes / Clarify orders / etc []  - 0 Routine Transfer to another Facility (non-emergent condition) []  - 0 Routine Hospital Admission (non-emergent condition) X- 1 15 New Admissions / Biomedical engineer / Ordering NPWT Apligraf, etc. , []  - 0 Emergency Hospital Admission (emergent condition) X- 1 10 Simple Discharge Coordination []  - 0 Complex (extensive) Discharge Coordination PROCESS - Special Needs []  - 0 Pediatric / Minor Patient Management []  - 0 Isolation Patient Management []  - 0 Hearing / Language / Visual special needs []  - 0 Assessment of Community assistance (transportation, D/C planning, etc.) []  - 0 Additional assistance / Altered mentation []  - 0 Support Surface(s)  Assessment (bed, cushion, seat, etc.) INTERVENTIONS - Wound Cleansing / Measurement []  - 0 Wound Imaging (photographs - any number of wounds) []  - 0 Wound Tracing (instead of photographs) []  - 0 Simple Wound Measurement - one wound []  - 0 Complex Wound Measurement - multiple wounds []  - 0 Simple Wound Cleansing - one  wound []  - 0 Complex Wound Cleansing - multiple wounds INTERVENTIONS - Wound Dressings []  - 0 Small Wound Dressing one or multiple wounds []  - 0 Medium Wound Dressing one or multiple wounds []  - 0 Large Wound Dressing one or multiple wounds []  - 0 Application of Medications - injection INTERVENTIONS - Miscellaneous []  - 0 External ear exam []  - 0 Specimen Collection (cultures, biopsies, blood, body fluids, etc.) []  - 0 Specimen(s) / Culture(s) sent or taken to Lab for analysis []  - 0 Patient Transfer (multiple staff / Harrel Lemon Lift / Similar devices) []  - 0 Simple Staple / Suture removal (25 or less) []  - 0 Complex Staple / Suture removal (26 or more) []  - 0 Hypo / Hyperglycemic Management (close monitor of Blood Glucose) []  - 0 Ankle / Brachial Index (ABI) - do not check if billed separately Has the patient been seen at the hospital within the last three years: Yes Total Score: 105 Level Of Care: New/Established - Level 3 Electronic Signature(s) Signed: 12/26/2020 6:28:10 PM By: Baruch Gouty RN, BSN Entered By: Baruch Gouty on 12/26/2020 10:30:21 -------------------------------------------------------------------------------- Encounter Discharge Information Details Patient Name: Date of Service: Andres [name], Andres [name] 12/26/2020 9:30 A M Medical Record Number: [id number] Patient Account Number: [id number] Date of Birth/Sex: Treating RN: [birth year] (65 y.o. Male) Baruch Gouty Primary Care Josalynn Johndrow: Mackie Pai Other Clinician: Referring Jameis Newsham: Treating Giancarlo Askren/Extender: Lamar Benes in Treatment: 0 Encounter  Discharge Information Items Discharge Condition: Stable Ambulatory Status: Wheelchair Discharge Destination: Home Transportation: Private Auto Accompanied By: self Schedule Follow-up Appointment: Yes Clinical Summary of Care: Patient Declined Electronic Signature(s) Signed: 12/26/2020 6:28:10 PM By: Baruch Gouty RN, BSN Entered By: Baruch Gouty on 12/26/2020 10:40:45 -------------------------------------------------------------------------------- Lower Extremity Assessment Details Patient Name: Date of Service: Andres [name], Andres [name] 12/26/2020 9:30 A M Medical Record Number: [id number] Patient Account Number: [id number] Date of Birth/Sex: Treating RN: February 16, 1956 (64 y.o. Male) Baruch Gouty Primary Care Sanjith Siwek: Mackie Pai Other Clinician: Referring Nalla Purdy: Treating Keevin Panebianco/Extender: Lamar Benes in Treatment: 0 Edema Assessment Assessed: Shirlyn Goltz: No] [Right: No] E[Left: dema] [Right: :] Calf Left: Right: Point of Measurement: From Medial Instep 42 cm Ankle Left: Right: Point of Measurement: From Medial Instep 24.5 cm Vascular Assessment Pulses: Dorsalis Pedis Palpable: [Right:Yes] Electronic Signature(s) Signed: 12/26/2020 6:28:10 PM By: Baruch Gouty RN, BSN Entered By: Baruch Gouty on 12/26/2020 10:14:51 -------------------------------------------------------------------------------- Multi Wound Chart Details Patient Name: Date of Service: Andres [name], Andres [name] 12/26/2020 9:30 A M Medical Record Number: [id number] Patient Account Number: [id number] Date of Birth/Sex: Treating RN: 1956-04-25 (64 y.o. Male) Baruch Gouty Primary Care Bladen Umar: Mackie Pai Other Clinician: Referring Marai Teehan: Treating Nafeesa Dils/Extender: Lamar Benes in Treatment: 0 Vital Signs Height(in): 71 Pulse(bpm): 72 Weight(lbs): 204 Blood Pressure(mmHg): 144/79 Body Mass Index(BMI): 28 Temperature(F):  98.5 Respiratory Rate(breaths/min): 18 Wound Assessments Treatment Notes Electronic Signature(s) Signed: 12/26/2020 10:51:53 AM By: Kalman Shan DO Signed: 12/26/2020 6:28:10 PM By: Baruch Gouty RN, BSN Entered By: Kalman Shan on 12/26/2020 10:38:17 -------------------------------------------------------------------------------- Pain Assessment Details Patient Name: Date of Service: Andres [name], Andres [name] 12/26/2020 9:30 A M Medical Record Number: [id number] Patient Account Number: [id number] Date of Birth/Sex: Treating RN: Dec 27, 1955 (64 y.o. Male) Baruch Gouty Primary Care Marsa Matteo: Mackie Pai Other Clinician: Referring Ashyia Schraeder: Treating Eliza Green/Extender: Lamar Benes in Treatment: 0 Active Problems Location of Pain Severity and Description of Pain Patient Has Paino No Site Locations Rate the pain. Current Pain Level: 0 Pain Management and Medication Current Pain Management: Electronic  Signature(s) Signed: 12/26/2020 6:28:10 PM By: Baruch Gouty RN, BSN Entered By: Baruch Gouty on 12/26/2020 10:15:04 -------------------------------------------------------------------------------- Patient/Caregiver Education Details Patient Name: Date of Service: Andres [name], Andres [name] 4/26/2022andnbsp9:30 Andres [name] Record Number: [id number] Patient Account Number: [id number] Date of Birth/Gender: Treating RN: 22-Jul-1956 (64 y.o. Male) Baruch Gouty Primary Care Physician: Mackie Pai Other Clinician: Referring Physician: Treating Physician/Extender: Lamar Benes in Treatment: 0 Education Assessment Education Provided To: Patient Education Topics Provided Welcome T [facility]: o Handouts: Welcome T [facility] o Methods: Explain/Verbal, Printed Responses: Reinforcements needed, State content correctly Wound/Skin Impairment: Handouts: Skin Care Do's and Dont's Methods:  Explain/Verbal, Printed Responses: Reinforcements needed, State content correctly Electronic Signature(s) Signed: 12/26/2020 6:28:10 PM By: Baruch Gouty RN, BSN Entered By: Baruch Gouty on 12/26/2020 10:29:31 -------------------------------------------------------------------------------- [facility] Details Patient Name: Date of Service: Andres [name], Andres [name] 12/26/2020 9:30 A M Medical Record Number: [id number] Patient Account Number: [id number] Date of Birth/Sex: Treating RN: 29-Dec-1955 (64 y.o. Male) Baruch Gouty Primary Care Bucky Grigg: Mackie Pai Other Clinician: Referring Derita Michelsen: Treating Countess Biebel/Extender: Lamar Benes in Treatment: 0 Vital Signs Time Taken: 10:01 Temperature (F): 98.5 Height (in): 71 Pulse (bpm): 72 Source: Stated Respiratory Rate (breaths/min): 18 Weight (lbs): 204 Blood Pressure (mmHg): 144/79 Source: Stated Reference Range: 80 - 120 mg / dl Body Mass Index (BMI): 28.4 Electronic Signature(s) Signed: 12/26/2020 6:28:10 PM By: Baruch Gouty RN, BSN Entered By: Baruch Gouty on 12/26/2020 10:01:47

## 2020-12-26 NOTE — Progress Notes (Signed)
Chistochina, Kemari ([id number]) Visit Report for 12/26/2020 Abuse/Suicide Risk Screen Details Patient Name: Date of Service: Andres [name], Andres [name] 12/26/2020 9:30 A M Medical Record Number: [id number] Patient Account Number: [id number] Date of Birth/Sex: Treating RN: 29-Nov-1955 (65 y.o. Male) Baruch Gouty Primary Care Pleshette Tomasini: Mackie Pai Other Clinician: Referring Athziri Freundlich: Treating Adlai Sinning/Extender: Lamar Benes in Treatment: 0 Abuse/Suicide Risk Screen Items Answer ABUSE RISK SCREEN: Has anyone close to you tried to hurt or harm you recentlyo No Do you feel uncomfortable with anyone in your familyo No Has anyone forced you do things that you didnt want to doo No Electronic Signature(s) Signed: 12/26/2020 6:28:10 PM By: Baruch Gouty RN, BSN Entered By: Baruch Gouty on 12/26/2020 10:09:34 -------------------------------------------------------------------------------- Activities of Daily Living Details Patient Name: Date of Service: Andres [name], Andres [name] 12/26/2020 9:30 A M Medical Record Number: [id number] Patient Account Number: [id number] Date of Birth/Sex: Treating RN: 1956/06/06 (65 y.o. Male) Baruch Gouty Primary Care Fardeen Steinberger: Mackie Pai Other Clinician: Referring Alinda Egolf: Treating Tonnette Zwiebel/Extender: Lamar Benes in Treatment: 0 Activities of Daily Living Items Answer Activities of Daily Living (Please select one for each item) Drive Automobile Need Assistance T Medications ake Completely Able Use T elephone Completely Able Care for Appearance Completely Able Use T oilet Completely Able Bath / Shower Completely Able Dress Self Completely Able Feed Self Completely Able Walk Completely Able Get In / Out Bed Completely Able Housework Completely Able Prepare Meals Completely [place] for Self Need Assistance Electronic Signature(s) Signed: 12/26/2020 6:28:10 PM By:  Baruch Gouty RN, BSN Entered By: Baruch Gouty on 12/26/2020 10:10:48 -------------------------------------------------------------------------------- Education Screening Details Patient Name: Date of Service: Andres [name], Andres [name] 12/26/2020 9:30 A M Medical Record Number: [id number] Patient Account Number: [id number] Date of Birth/Sex: Treating RN: 1955-10-01 (64 y.o. Male) Baruch Gouty Primary Care Jeremaih Klima: Mackie Pai Other Clinician: Referring Tully Mcinturff: Treating Dvon Jiles/Extender: Lamar Benes in Treatment: 0 Primary Learner Assessed: Patient Learning Preferences/Education Level/Primary Language Learning Preference: Explanation, Demonstration, Printed Material Highest Education Level: College or Above Preferred Language: English Cognitive Barrier Language Barrier: No Translator Needed: No Memory Deficit: No Emotional Barrier: No Cultural/Religious Beliefs Affecting Medical Care: No Physical Barrier Impaired Vision: Yes Glasses Impaired Hearing: No Decreased Hand dexterity: No Knowledge/Comprehension Knowledge Level: High Comprehension Level: High Ability to understand written instructions: High Ability to understand verbal instructions: High Motivation Anxiety Level: Calm Cooperation: Cooperative Education Importance: Acknowledges Need Interest in Health Problems: Asks Questions Perception: Coherent Willingness to Engage in Self-Management High Activities: Readiness to Engage in Self-Management High Activities: Electronic Signature(s) Signed: 12/26/2020 6:28:10 PM By: Baruch Gouty RN, BSN Entered By: Baruch Gouty on 12/26/2020 10:11:26 -------------------------------------------------------------------------------- Fall Risk Assessment Details Patient Name: Date of Service: Andres [name], Andres [name] 12/26/2020 9:30 A M Medical Record Number: [id number] Patient Account Number: [id number] Date of Birth/Sex: Treating  RN: Aug 14, 1956 (64 y.o. Male) Baruch Gouty Primary Care Liyana Suniga: Mackie Pai Other Clinician: Referring Keedan Sample: Treating Kolby Schara/Extender: Lamar Benes in Treatment: 0 Fall Risk Assessment Items Have you had 2 or more falls in the last 12 monthso 0 Yes Have you had any fall that resulted in injury in the last 12 monthso 0 No FALLS RISK SCREEN History of falling - immediate or within 3 months 25 Yes Secondary diagnosis (Do you have 2 or more medical diagnoseso) 0 No Ambulatory aid None/bed rest/wheelchair/nurse 0 No Crutches/cane/walker 15 Yes Furniture 0 No Intravenous therapy Access/Saline/Heparin Lock 0 No Gait/Transferring Normal/ bed rest/ wheelchair 0  Yes Weak (short steps with or without shuffle, stooped but able to lift head while walking, may seek 0 No support from furniture) Impaired (short steps with shuffle, may have difficulty arising from chair, head down, impaired 0 No balance) Mental Status Oriented to own ability 0 Yes Electronic Signature(s) Signed: 12/26/2020 6:28:10 PM By: Baruch Gouty RN, BSN Entered By: Baruch Gouty on 12/26/2020 10:12:05 -------------------------------------------------------------------------------- Foot Assessment Details Patient Name: Date of Service: Andres [name], Andres [name] 12/26/2020 9:30 A M Medical Record Number: [id number] Patient Account Number: [id number] Date of Birth/Sex: Treating RN: [birth year] (64 y.o. 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 Site Locations + = Sensation present, - = Sensation absent, C = Callus, U = Ulcer R = Redness, W = Warmth, M = Maceration, PU = Pre-ulcerative lesion F = Fissure, S = Swelling, D = Dryness Assessment Right: Left: Other  Deformity: No Prior Foot Ulcer: No Prior Amputation: No Charcot Joint: No Ambulatory Status: Ambulatory With Help Assistance Device: Walker Gait: Electronic Signature(s) Signed: 12/26/2020 6:28:10 PM By: Baruch Gouty RN, BSN Entered By: Baruch Gouty on 12/26/2020 10:13:02 -------------------------------------------------------------------------------- Nutrition Risk Screening Details Patient Name: Date of Service: Andres [name], Andres [name] 12/26/2020 9:30 A M Medical Record Number: [id number] Patient Account Number: [id number] Date of Birth/Sex: Treating RN: [birth year] (64 y.o. Male) Baruch Gouty Primary Care Allen Egerton: Mackie Pai Other Clinician: Referring Hilliary Jock: Treating Ronold Hardgrove/Extender: Lamar Benes in Treatment: 0 Height (in): 71 Weight (lbs): 204 Body Mass Index (BMI): 28.4 Nutrition Risk Screening Items Score Screening NUTRITION RISK SCREEN: I have an illness or condition that made me change the kind and/or amount of food I eat 0 No I eat fewer than two meals per day 0 No I eat few fruits and vegetables, or milk products 0 No I have three or more drinks of beer, liquor or wine almost every day 0 No I have tooth or mouth problems that make it hard for me to eat 0 No I don't always have enough money to buy the food I need 0 No I eat alone most of the time 0 No I take three or more different prescribed or over-the-counter drugs a day 1 Yes Without wanting to, I have lost or gained 10 pounds in the last six months 0 No I am not always physically able to shop, cook and/or feed myself 0 No Nutrition Protocols Good Risk Protocol 0 No interventions needed Moderate Risk Protocol High Risk Proctocol Risk Level: Good Risk Score: 1 Electronic Signature(s) Signed: 12/26/2020 6:28:10 PM By: Baruch Gouty RN, BSN Entered By: Baruch Gouty on 12/26/2020 10:12:44

## 2020-12-26 NOTE — Progress Notes (Signed)
JAVELL, BLACKBURN ([id number]) Visit Report for 12/26/2020 Chief Complaint Document Details Patient Name: Date of Service: Andres [name], Andres [name] 12/26/2020 9:30 A M Medical Record Number: [id number] Patient Account Number: [id number] Date of Birth/Sex: Treating RN: June 29, 1956 (65 y.o. Male) Baruch Gouty Primary Care Provider: Mackie Pai Other Clinician: Referring Provider: Treating Provider/Extender: Lamar Benes in Treatment: 0 Information Obtained from: Patient Chief Complaint Left lower extremity cellulitis Electronic Signature(s) Signed: 12/26/2020 10:51:53 AM By: Kalman Shan DO Entered By: Kalman Shan on 12/26/2020 10:38:36 -------------------------------------------------------------------------------- HPI Details Patient Name: Date of Service: Andres [name], Andres [name] 12/26/2020 9:30 A M Medical Record Number: [id number] Patient Account Number: [id number] Date of Birth/Sex: Treating RN: 07/15/1956 (64 y.o. 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 He states that on 4/16 he noticed a red spot on his lower extremity that happened spontaneously and was slightly tender to the touch. He tried antibiotic ointment with little benefit. He then saw his primary care physician who prescribed him doxycycline. Over the course of the last 10 days the wound has healed and is closed. He presented because of his recent history of necrotizing fasciitis to the right extremity and needed close follow-up to assure the left leg did  not become worse. He reports no issues and he no longer has tenderness to palpation. He reports minimal drainage when it was infected. And no drainage now. He denies any fever/chills or increased warmth or erythema to the previous wound site. Electronic Signature(s) Signed: 12/26/2020 10:51:53 AM By: Kalman Shan DO Entered By: Kalman Shan on 12/26/2020 10:43:35 -------------------------------------------------------------------------------- Physical Exam Details Patient Name: Date of Service: Andres [name], Andres [name] 12/26/2020 9:30 A M Medical Record Number: [id number] Patient Account Number: [id number] Date of Birth/Sex: Treating RN: 06-19-1956 (64 y.o. Male) Baruch Gouty Primary Care Provider: Other Clinician: Mackie Pai Referring Provider: Treating Provider/Extender: Lamar Benes in Treatment: 0 Constitutional respirations regular, non-labored and within target range for patient.. Cardiovascular 2+ dorsalis pedis/posterior tibialis pulses. Psychiatric pleasant and cooperative. Notes Left lower extremity: No swelling, erythema, or drainage noted. There is a very small scab on the anterior shin. Electronic Signature(s) Signed: 12/26/2020 10:51:53 AM By: Kalman Shan DO Entered By: Kalman Shan on 12/26/2020 10:44:37 -------------------------------------------------------------------------------- Physician Orders Details Patient Name: Date of Service: Andres [name], Andres [name] 12/26/2020 9:30 A M Medical Record Number: [id number] Patient Account Number: [id number] Date of Birth/Sex: Treating RN: 1956/05/24 (64 y.o. 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, initial encounter Discharge From [facility] Services Discharge from [place] Non Wound Condition Left Lower Extremity pply the following to affected area as directed: - antibiotic ointment and a bandaid daily for 1 week A Electronic Signature(s) Signed: 12/26/2020 10:51:53 AM By: Kalman Shan DO Entered By: Kalman Shan on 12/26/2020 10:44:55 -------------------------------------------------------------------------------- Problem List Details Patient Name: Date of Service: Andres [name], Andres [name] 12/26/2020 9:30 A M  Medical Record Number: [id number] Patient Account Number: [id number] Date of Birth/Sex: Treating RN: June 22, 1956 (65 y.o. 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 He states that on 4/16 he noticed a red spot on his lower extremity that happened spontaneously and was slightly tender to the touch. He tried antibiotic ointment with little benefit. He then saw his primary care physician who prescribed him doxycycline. Over the course of the last 10 days the wound has healed and is closed. He presented because of his recent history of necrotizing fasciitis to the right extremity and needed close follow-up to assure the left leg did not become worse. He reports no issues and he no longer has tenderness to palpation. He reports minimal drainage when it was infected. And no drainage now. He denies any fever/chills or increased warmth or erythema to the previous wound site. Patient History Information obtained from Patient. Allergies niacin (Reaction: shortness of breath) Family History Cancer - Father, No family history of Diabetes, Heart Disease, Hereditary Spherocytosis, Hypertension, Kidney Disease, Lung Disease, Seizures, Stroke, Thyroid Problems, Tuberculosis. Social History Former smoker - quit 72 yr ago, Marital Status - Married, Alcohol Use - Rarely, Drug Use - No History, Caffeine Use - Moderate - tea. Medical History Eyes Denies history of Cataracts, Glaucoma, Optic Neuritis Ear/Nose/Mouth/Throat Denies history of Chronic sinus problems/congestion, Middle ear problems Cardiovascular Patient has history of Hypertension Endocrine Denies history of Type I Diabetes, Type II Diabetes Genitourinary Denies history of End Stage Renal Disease Oncologic Denies history of Received Chemotherapy, Received  Radiation Psychiatric Patient has history of Confinement Anxiety - mild Denies history of Anorexia/bulimia Hospitalization/Surgery History - right AKA. - fasciotomy right lower leg. Medical A Surgical History Notes nd Oncologic basal cell carcinoma of skin on back and neck Review of Systems (ROS) Constitutional Symptoms (General Health) Denies complaints or symptoms of Fatigue, Fever, Chills, Marked Weight Change. Eyes Complains or has symptoms of Glasses / Contacts. Denies complaints or symptoms of Dry Eyes, Vision Changes. Ear/Nose/Mouth/Throat Denies complaints or symptoms of Chronic sinus problems or rhinitis. Respiratory Denies complaints or symptoms of Chronic or frequent coughs, Shortness of Breath. Cardiovascular Denies complaints or symptoms of Chest pain. Gastrointestinal Denies complaints or symptoms of Frequent diarrhea, Nausea, Vomiting. Endocrine Denies complaints or symptoms of Heat/cold intolerance. Genitourinary Denies complaints or symptoms of Frequent urination. Integumentary (Skin) Denies complaints or symptoms of Wounds. Musculoskeletal Denies complaints or symptoms  of Muscle Pain, Muscle Weakness. Neurologic Denies complaints or symptoms of Numbness/parasthesias. Psychiatric Denies complaints or symptoms of Claustrophobia, Suicidal. Objective Constitutional respirations regular, non-labored and within target range for patient.. Vitals Time Taken: 10:01 AM, Height: 71 in, Source: Stated, Weight: 204 lbs, Source: Stated, BMI: 28.4, Temperature: 98.5 F, Pulse: 72 bpm, Respiratory Rate: 18 breaths/min, Blood Pressure: 144/79 mmHg. Cardiovascular 2+ dorsalis pedis/posterior tibialis pulses. Psychiatric pleasant and cooperative. General Notes: Left lower extremity: No swelling, erythema, or drainage noted. There is a very small scab on the anterior shin. 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 He had a culture done by his primary care physician that showed no growth. He has finished his course of antibiotics. Patient has a recent history of necrotizing fasciitis to the right lower extremity that resulted in an AKA. Due to this his primary care physician wanted close follow-up to assure that the left leg did not worsen which I also think was imperitive. At this time since there are no open wounds and no signs of infection he can follow-up as needed. I told him to keep the area covered for the next week as this is still fragile skin, but to change daily Plan Discharge From [facility] Services: Discharge from [place] Non Wound Condition: Apply the following to affected area as directed: - antibiotic ointment and a bandaid daily for 1 week 1. Follow-up as needed 2. Keep covered for another week Electronic Signature(s) Signed: 12/26/2020 10:51:53 AM By: Kalman Shan DO Entered By: Kalman Shan on 12/26/2020 10:48:56 -------------------------------------------------------------------------------- HxROS Details Patient Name: Date of Service: Andres [name], Andres [name] 12/26/2020 9:30 A M Medical Record Number: [id number] Patient Account Number: [id number] Date of Birth/Sex: Treating RN: 1955/12/10 (64 y.o. Male) Baruch Gouty Primary Care Provider: Mackie Pai Other Clinician: Referring Provider: Treating Provider/Extender: Lamar Benes in Treatment: 0 Information Obtained From Patient Constitutional Symptoms (General Health) Complaints and Symptoms: Negative for: Fatigue; Fever; Chills; Marked Weight Change Eyes Complaints and Symptoms: Positive for: Glasses / Contacts Negative for: Dry Eyes; Vision Changes Medical History: Negative for: Cataracts; Glaucoma; Optic  Neuritis Ear/Nose/Mouth/Throat Complaints and Symptoms: Negative for: Chronic sinus problems or rhinitis Medical History: Negative for: Chronic sinus problems/congestion; Middle ear problems Respiratory Complaints and Symptoms: Negative for: Chronic or frequent coughs; Shortness of Breath Cardiovascular Complaints and Symptoms: Negative for: Chest pain Medical History: Positive for: Hypertension Gastrointestinal Complaints and Symptoms: Negative for: Frequent diarrhea; Nausea; Vomiting Endocrine Complaints and Symptoms: Negative for: Heat/cold intolerance Medical History: Negative for: Type I Diabetes; Type II Diabetes Genitourinary Complaints and Symptoms: Negative for: Frequent urination Medical History: Negative for: End Stage Renal Disease Integumentary (Skin) Complaints and Symptoms: Negative for: Wounds Musculoskeletal Complaints and Symptoms: Negative for: Muscle Pain; Muscle Weakness Neurologic Complaints and Symptoms: Negative for: Numbness/parasthesias Psychiatric Complaints and Symptoms: Negative for: Claustrophobia; Suicidal Medical History: Positive for: Confinement Anxiety - mild Negative for: Anorexia/bulimia Hematologic/Lymphatic Immunological Oncologic Medical History: Negative for: Received Chemotherapy; Received Radiation Past Medical History Notes: basal cell carcinoma of skin on back and neck Immunizations Pneumococcal Vaccine: Received Pneumococcal Vaccination: No Tetanus Vaccine: Last tetanus shot: 10/12/2020 Implantable Devices None Hospitalization / Surgery History Type of Hospitalization/Surgery right AKA fasciotomy right lower leg Family and Social History Cancer: Yes - Father; Diabetes: No; Heart Disease: No; Hereditary Spherocytosis: No; Hypertension: No; Kidney Disease: No; Lung  Disease: No; Seizures: No; Stroke: No; Thyroid Problems: No; Tuberculosis: No; Former smoker - quit 35 yr ago; Marital Status - Married; Alcohol Use:  Rarely; Drug Use: No History; Caffeine Use: Moderate - tea; Financial Concerns: No; Food, Clothing or Shelter Needs: No; Support System Lacking: No; Transportation Concerns: No Electronic Signature(s) Signed: 12/26/2020 10:51:53 AM By: Kalman Shan DO Signed: 12/26/2020 6:28:10 PM By: Baruch Gouty RN, BSN Entered By: Baruch Gouty on 12/26/2020 10:09:27 -------------------------------------------------------------------------------- SuperBill Details Patient Name: Date of Service: Andres [name], Veldon 12/26/2020 Medical Record Number: [id number] Patient Account Number: [id number] Date of Birth/Sex: Treating RN: 1955/11/19 (64 y.o. Male) Baruch Gouty Primary Care Provider: Mackie Pai Other Clinician: Referring Provider: Treating Provider/Extender: Lamar Benes in Treatment: 0 Diagnosis Coding ICD-10 Codes Code Description L03.116 Cellulitis of left lower limb M72.6 Necrotizing fasciitis S78.111A Complete traumatic amputation at level between right hip and knee, initial encounter Facility Procedures CPT4 Code: [id number] Description: [place] VISIT-LEV 3 EST PT Modifier: Quantity: 1 Electronic Signature(s) Signed: 12/26/2020 10:51:53 AM By: Kalman Shan DO Entered By: Kalman Shan on 12/26/2020 10:51:29

## 2020-12-28 ENCOUNTER — Telehealth: Payer: Self-pay | Admitting: Orthopedic Surgery

## 2020-12-28 NOTE — Telephone Encounter (Signed)
Andres [name] from rehab in [place] called and requested orders for PT and notes from the visits.  Fax [phone]

## 2020-12-29 ENCOUNTER — Telehealth: Payer: Self-pay | Admitting: Medical

## 2020-12-29 ENCOUNTER — Other Ambulatory Visit: Payer: Self-pay

## 2020-12-29 NOTE — Telephone Encounter (Signed)
Pt is s/p a right AKA 10/15/2020 order for prosthetic gait training.

## 2020-12-29 NOTE — Telephone Encounter (Signed)
I got paperwork from [facility]. Not sure what to do with this paperwork. I have seen him recently for rt lower leg ext amputation follow up. 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  He had more new left lower ext different issue on visit with me 12-22-2020. Can you look at paperwork. Will leave it on your desk. What do you think?

## 2021-02-01 ENCOUNTER — Ambulatory Visit: Payer: BLUE CROSS/BLUE SHIELD | Admitting: Medical

## 2021-02-05 ENCOUNTER — Other Ambulatory Visit: Payer: Self-pay

## 2021-02-05 ENCOUNTER — Encounter: Payer: Self-pay | Admitting: Orthopedic Surgery

## 2021-02-05 ENCOUNTER — Ambulatory Visit (INDEPENDENT_AMBULATORY_CARE_PROVIDER_SITE_OTHER): Payer: BLUE CROSS/BLUE SHIELD | Admitting: Physician Assistant

## 2021-02-05 DIAGNOSIS — Z89611 Acquired absence of right leg above knee: Secondary | ICD-10-CM

## 2021-02-05 DIAGNOSIS — S78111A Complete traumatic amputation at level between right hip and knee, initial encounter: Secondary | ICD-10-CM

## 2021-02-05 NOTE — Progress Notes (Signed)
Office Visit Note   Patient: Andres [name]           Date of Birth: 02-Jan-1956           MRN: [id number] Visit Date: 02/05/2021              Requested by: Mackie Pai, PA-C [address] [place],  [place] [address] PCP: Mackie Pai, PA-C  Chief Complaint  Patient presents with  . Right Knee - Follow-up      HPI: Patient is status post right above-knee amputation.  He is working with physical therapy.  He is going to have a slight adjustment to his prosthetic.  Otherwise he is doing well.  Assessment & Plan: Visit Diagnoses: No diagnosis found.  Plan: Patient may follow-up as needed.  He was provided a permanent handicap placard application signed by Dr. Sharol Given  Follow-Up Instructions: No follow-ups on file.   Ortho Exam  Patient is alert, oriented, no adenopathy, well-dressed, normal affect, normal respiratory effort. Examination demonstrates well-healed surgical incision no swelling no erythema no tenderness no ascending cellulitis or signs of infection  Imaging: No results found. No images are attached to the encounter.  Labs: Lab Results  Component Value Date   HGBA1C 5.9 (H) 10/18/2020   HGBA1C 5.9 (H) 11/08/2018   ESRSEDRATE 61 (H) 12/21/2020   ESRSEDRATE 101 (H) 10/14/2020   CRP <1.0 12/21/2020   CRP 8.6 (H) 10/14/2020   CRP 5.2 (H) 10/10/2020   REPTSTATUS 10/18/2020 FINAL 10/13/2020   GRAMSTAIN  10/13/2020    FEW WBC PRESENT, PREDOMINANTLY PMN NO ORGANISMS SEEN    CULT  10/13/2020    No growth aerobically or anaerobically. Performed at [facility], [place] [address] [place], [place] [address]    LABORGA METHICILLIN RESISTANT STAPHYLOCOCCUS AUREUS 10/11/2020     Lab Results  Component Value Date   ALBUMIN 2.2 (L) 10/21/2020   ALBUMIN 2.2 (L) 10/10/2020   ALBUMIN 2.1 (L) 10/09/2020    No results found for: MG No results found for: VD25OH  No results found for: PREALBUMIN CBC EXTENDED Latest Ref Rng & Units  12/20/2020 10/30/2020 10/26/2020  WBC 4.0 - 10.5 K/uL 4.8 4.1 4.6  RBC 4.22 - 5.81 Mil/uL 4.43 3.23(L) 2.97(L)  HGB 13.0 - 17.0 g/dL 12.9(L) 9.2(L) 8.4(L)  HCT 39.0 - 52.0 % 39.0 29.6(L) 27.0(L)  PLT 150.0 - 400.0 K/uL 224.0 314 424(H)  NEUTROABS 1.4 - 7.7 K/uL 2.2 - 1.8  LYMPHSABS 0.7 - 4.0 K/uL 2.1 - 1.8     There is no height or weight on file to calculate BMI.  Orders:  No orders of the defined types were placed in this encounter.  No orders of the defined types were placed in this encounter.    Procedures: No procedures performed  Clinical Data: No additional findings.  ROS:  All other systems negative, except as noted in the HPI. Review of Systems  Objective: Vital Signs: There were no vitals taken for this visit.  Specialty Comments:  No specialty comments available.  PMFS History: Patient Active Problem List   Diagnosis Date Noted  . Reactive depression   . Drug induced constipation   . Post-operative pain   . 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[place]) 10/20/2020  . Unilateral AKA, right ([place])   . Acute blood loss anemia   . Prediabetes   . Necrotizing fasciitis of lower leg ([place])   . Abscess of right lower leg   . Cellulitis  10/05/2020  . Cellulitis of right lower extremity 10/05/2020  . COVID-19 virus infection 10/05/2020  . COVID   . Hyponatremia   . Chest pain 11/08/2018  . Accelerated hypertension 11/08/2018  . Encounter to establish care 07/09/2017  . Essential hypertension 08/16/2015  . Sensorineural hearing loss (SNHL), bilateral 08/16/2015  . Impaired fasting glucose 08/23/2014  . Obesity (BMI 30-39.9) 08/23/2014  . History of nonmelanoma skin cancer 07/15/2012  . Allergic rhinitis 10/24/2011  . Basal cell carcinoma of back 07/10/2011   Past Medical History:  Diagnosis Date  . Basal cell carcinoma   . Hypertension   . Reactive depression     Family History  Family history unknown: Yes    Past Surgical History:   Procedure Laterality Date  . AMPUTATION Right 10/15/2020   Procedure: AMPUTATION ABOVE KNEE;  Surgeon: Newt Minion, MD;  Location: [place];  Service: Orthopedics;  Laterality: Right;  . FASCIOTOMY Right 10/11/2020   Procedure: RIGHT LEG FASCIOTOMIES AND DEBRIDE ULCER;  Surgeon: Newt Minion, MD;  Location: [place];  Service: Orthopedics;  Laterality: Right;  . I & D EXTREMITY Right 10/13/2020   Procedure: REPEAT DEBRIDEMENT RIGHT LEG;  Surgeon: Newt Minion, MD;  Location: [place];  Service: Orthopedics;  Laterality: Right;   Social History   Occupational History  . Not on file  Tobacco Use  . Smoking status: Former Research scientist (life sciences)  . Smokeless tobacco: Never Used  Vaping Use  . Vaping Use: Never used  Substance and Sexual Activity  . Alcohol use: Not Currently  . Drug use: Never  . Sexual activity: Yes

## 2021-02-12 ENCOUNTER — Other Ambulatory Visit: Payer: Self-pay | Admitting: Anesthesiology

## 2021-02-26 ENCOUNTER — Other Ambulatory Visit: Payer: Self-pay

## 2021-02-26 DIAGNOSIS — I10 Essential (primary) hypertension: Secondary | ICD-10-CM | POA: Insufficient documentation

## 2021-02-26 DIAGNOSIS — C4491 Basal cell carcinoma of skin, unspecified: Secondary | ICD-10-CM | POA: Insufficient documentation

## 2021-02-27 ENCOUNTER — Ambulatory Visit: Payer: BLUE CROSS/BLUE SHIELD | Admitting: Cardiology

## 2021-02-27 ENCOUNTER — Encounter: Payer: Self-pay | Admitting: Cardiology

## 2021-02-27 ENCOUNTER — Other Ambulatory Visit: Payer: Self-pay

## 2021-02-27 VITALS — BP 114/60 | HR 66 | Ht 71.0 in | Wt 214.1 lb

## 2021-02-27 DIAGNOSIS — I10 Essential (primary) hypertension: Secondary | ICD-10-CM

## 2021-02-27 NOTE — Progress Notes (Signed)
Cardiology Office Note:    Date:  02/27/2021   ID:  Andres [name], DOB 10-28-1955, MRN [id number]  PCP:  Andres Pai, PA-C  Cardiologist:  Jenean Lindau, MD   Referring MD: Andres Pai, PA-C    ASSESSMENT:    1. Essential hypertension    PLAN:    In order of problems listed above:  Primary prevention stressed with the patient.  Importance of compliance with diet medication stressed any vocalized understanding Essential hypertension: Blood pressure stable and diet was emphasized.  Lifestyle modification was also discussed. Patient was advised to diet and lose weight.  Lipids followed by primary care.  He will have blood work done in the next few months.  Previous lab work especially reviewed and discussed with him. Patient will be seen in follow-up appointment in 6 months or earlier if the patient has any concerns    Medication Adjustments/Labs and Tests Ordered: Current medicines are reviewed at length with the patient today.  Concerns regarding medicines are outlined above.  No orders of the defined types were placed in this encounter.  No orders of the defined types were placed in this encounter.    No chief complaint on file.    History of Present Illness:    Andres [name] is a 65 y.o. male.  Patient has past medical history of essential hypertension.  He underwent right above-knee amputation because of infection according to the history given by the patient.  He denies any chest pain orthopnea or PND.  He now has a prosthesis and ambulates well with that.  At the time of my evaluation, the patient is alert awake oriented and in no distress.  Past Medical History:  Diagnosis Date   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[place]) 10/20/2020   Abscess of right lower leg    Accelerated hypertension 11/08/2018   Acute blood loss anemia    Allergic rhinitis 10/24/2011   Basal cell carcinoma    Basal cell carcinoma of back 07/10/2011   Cellulitis  10/05/2020   Cellulitis of right lower extremity 10/05/2020   Chest pain 11/08/2018   COVID    COVID-19 virus infection 10/05/2020   Drug induced constipation    Encounter to establish care 07/09/2017   Essential hypertension 08/16/2015   Chronic, not well controlled; I advised patient to increase losartan however he wants to work on diet and exercise more first - cont losartan 25mg  daily - check Bps and record - if Bp's continue to trend up patient to go up to 50mg  of losartan - check BMP today - Signs and symptoms of hypertensive crisis reviewed with patient, included but not limited to, worsening sudden onset headache, blurry vi   History of nonmelanoma skin cancer 07/15/2012   Hypertension    Hyponatremia    Impaired fasting glucose 08/23/2014   Chronic, last A1c was 6.0 - will recheck today   Necrotizing fasciitis of lower leg ([place])    Obesity (BMI 30-39.9) 08/23/2014   Post-operative pain    Prediabetes    Reactive depression    Sensorineural hearing loss (SNHL), bilateral 08/16/2015   Unilateral AKA, right [facility])     Past Surgical History:  Procedure Laterality Date   AMPUTATION Right 10/15/2020   Procedure: AMPUTATION ABOVE KNEE;  Surgeon: Newt Minion, MD;  Location: [place];  Service: Orthopedics;  Laterality: Right;   FASCIOTOMY Right 10/11/2020   Procedure: RIGHT LEG FASCIOTOMIES AND DEBRIDE ULCER;  Surgeon: Newt Minion, MD;  Location: [place];  Service: Orthopedics;  Laterality: Right;   I & D EXTREMITY Right 10/13/2020   Procedure: REPEAT DEBRIDEMENT RIGHT LEG;  Surgeon: Newt Minion, MD;  Location: [place];  Service: Orthopedics;  Laterality: Right;    Current Medications: Current Meds  Medication Sig   Ascorbic Acid (VITAMIN C) 500 MG CHEW Chew 1 tablet by mouth daily.   aspirin 81 MG chewable tablet Chew 1 tablet (81 mg total) by mouth daily.   B Complex Vitamins (B-COMPLEX/B-12) TABS Take 1 tablet by mouth daily.   DULoxetine (CYMBALTA) 20 MG capsule Take 20 mg by mouth at  bedtime.   gabapentin (NEURONTIN) 400 MG capsule Take 400 mg by mouth 2 (two) times daily.   losartan (COZAAR) 25 MG tablet Take 1 tablet (25 mg total) by mouth daily.     Allergies:   Niacin and related   Social History   Socioeconomic History   Marital status: Married    Spouse name: Not on file   Number of children: Not on file   Years of education: Not on file   Highest education level: Not on file  Occupational History   Not on file  Tobacco Use   Smoking status: Former    Pack years: 0.00   Smokeless tobacco: Never  Vaping Use   Vaping Use: Never used  Substance and Sexual Activity   Alcohol use: Not Currently   Drug use: Never   Sexual activity: Yes  Other Topics Concern   Not on file  Social History Narrative   Not on file   Social Determinants of Health   Financial Resource Strain: Not on file  Food Insecurity: Not on file  Transportation Needs: Not on file  Physical Activity: Not on file  Stress: Not on file  Social Connections: Not on file     Family History: The patient's Family history is unknown by patient.  ROS:   Please see the history of present illness.    All other systems reviewed and are negative.  EKGs/Labs/Other Studies Reviewed:    The following studies were reviewed today: I discussed my findings with the patient at length.  EKG was sinus rhythm and nonspecific ST-T changes   Recent Labs: 10/06/2020: TSH 0.741 10/21/2020: ALT 24 10/30/2020: BUN 19; Creatinine, Ser 0.77; Potassium 3.7; Sodium 137 12/20/2020: Hemoglobin 12.9; Platelets 224.0  Recent Lipid Panel    Component Value Date/Time   CHOL 161 04/05/2019 0717   TRIG 139.0 04/05/2019 0717   HDL 38.70 (L) 04/05/2019 0717   CHOLHDL 4 04/05/2019 0717   VLDL 27.8 04/05/2019 0717   LDLCALC 94 04/05/2019 0717    Physical Exam:    VS:  BP 114/60   Pulse 66   Ht 5\' 11"  (1.803 m)   Wt 214 lb 1.9 oz (97.1 kg)   SpO2 97%   BMI 29.86 kg/m     Wt Readings from Last 3  Encounters:  02/27/21 214 lb 1.9 oz (97.1 kg)  12/22/20 204 lb (92.5 kg)  12/20/20 204 lb (92.5 kg)     GEN: Patient is in no acute distress HEENT: Normal NECK: No JVD; No carotid bruits LYMPHATICS: No lymphadenopathy CARDIAC: Hear sounds regular, 2/6 systolic murmur at the apex. RESPIRATORY:  Clear to auscultation without rales, wheezing or rhonchi  ABDOMEN: Soft, non-tender, non-distended MUSCULOSKELETAL:  No edema; No deformity  SKIN: Warm and dry NEUROLOGIC:  Alert and oriented x 3 PSYCHIATRIC:  Normal affect   Signed, Jenean Lindau, MD  02/27/2021 4:53 PM  [facility]

## 2021-02-27 NOTE — Patient Instructions (Signed)

## 2021-03-02 MED ORDER — LOSARTAN POTASSIUM 25 MG PO TABS: 25.0000 mg | ORAL_TABLET | Freq: Every day | ORAL | 3 refills | Status: DC

## 2021-03-02 NOTE — Addendum Note (Signed)
Addended by: Truddie Hidden on: 03/02/2021 09:48 AM   Modules accepted: Orders

## 2021-03-06 ENCOUNTER — Other Ambulatory Visit: Payer: Self-pay | Admitting: Cardiology

## 2021-03-21 ENCOUNTER — Other Ambulatory Visit: Payer: Self-pay | Admitting: Anesthesiology

## 2021-05-22 DIAGNOSIS — L821 Other seborrheic keratosis: Secondary | ICD-10-CM | POA: Diagnosis not present

## 2021-05-22 DIAGNOSIS — D1801 Hemangioma of skin and subcutaneous tissue: Secondary | ICD-10-CM | POA: Diagnosis not present

## 2021-05-22 DIAGNOSIS — L814 Other melanin hyperpigmentation: Secondary | ICD-10-CM | POA: Diagnosis not present

## 2021-05-22 DIAGNOSIS — X32XXXS Exposure to sunlight, sequela: Secondary | ICD-10-CM | POA: Diagnosis not present

## 2021-05-25 ENCOUNTER — Other Ambulatory Visit: Payer: Self-pay

## 2021-05-25 ENCOUNTER — Emergency Department (HOSPITAL_BASED_OUTPATIENT_CLINIC_OR_DEPARTMENT_OTHER)
Admission: EM | Admit: 2021-05-25 | Discharge: 2021-05-25 | Disposition: A | Discharge status: Home / Self Care | Payer: Worker's Compensation | Attending: Emergency Medicine | Admitting: Emergency Medicine

## 2021-05-25 ENCOUNTER — Emergency Department (HOSPITAL_BASED_OUTPATIENT_CLINIC_OR_DEPARTMENT_OTHER): Payer: Worker's Compensation

## 2021-05-25 ENCOUNTER — Encounter (HOSPITAL_BASED_OUTPATIENT_CLINIC_OR_DEPARTMENT_OTHER): Payer: Self-pay | Admitting: Emergency Medicine

## 2021-05-25 DIAGNOSIS — Z7982 Long term (current) use of aspirin: Secondary | ICD-10-CM | POA: Insufficient documentation

## 2021-05-25 DIAGNOSIS — M869 Osteomyelitis, unspecified: Secondary | ICD-10-CM | POA: Insufficient documentation

## 2021-05-25 DIAGNOSIS — Z85828 Personal history of other malignant neoplasm of skin: Secondary | ICD-10-CM | POA: Insufficient documentation

## 2021-05-25 DIAGNOSIS — Z87891 Personal history of nicotine dependence: Secondary | ICD-10-CM | POA: Insufficient documentation

## 2021-05-25 DIAGNOSIS — Z79899 Other long term (current) drug therapy: Secondary | ICD-10-CM | POA: Insufficient documentation

## 2021-05-25 DIAGNOSIS — L03115 Cellulitis of right lower limb: Secondary | ICD-10-CM | POA: Insufficient documentation

## 2021-05-25 DIAGNOSIS — I10 Essential (primary) hypertension: Secondary | ICD-10-CM | POA: Insufficient documentation

## 2021-05-25 DIAGNOSIS — Z8616 Personal history of COVID-19: Secondary | ICD-10-CM | POA: Insufficient documentation

## 2021-05-25 DIAGNOSIS — Z5189 Encounter for other specified aftercare: Secondary | ICD-10-CM

## 2021-05-25 LAB — CBC WITH DIFFERENTIAL/PLATELET
Abs Immature Granulocytes: 0.02 10*3/uL (ref 0.00–0.07)
Basophils Absolute: 0.1 10*3/uL (ref 0.0–0.1)
Basophils Relative: 1 %
Eosinophils Absolute: 0.3 10*3/uL (ref 0.0–0.5)
Eosinophils Relative: 4 %
HCT: 38.8 % — ABNORMAL LOW (ref 39.0–52.0)
Hemoglobin: 13.4 g/dL (ref 13.0–17.0)
Immature Granulocytes: 0 %
Lymphocytes Relative: 38 %
Lymphs Abs: 2.5 10*3/uL (ref 0.7–4.0)
MCH: 30.2 pg (ref 26.0–34.0)
MCHC: 34.5 g/dL (ref 30.0–36.0)
MCV: 87.6 fL (ref 80.0–100.0)
Monocytes Absolute: 0.5 10*3/uL (ref 0.1–1.0)
Monocytes Relative: 7 %
Neutro Abs: 3.3 10*3/uL (ref 1.7–7.7)
Neutrophils Relative %: 50 %
Platelets: 257 10*3/uL (ref 150–400)
RBC: 4.43 MIL/uL (ref 4.22–5.81)
RDW: 12.9 % (ref 11.5–15.5)
WBC: 6.7 10*3/uL (ref 4.0–10.5)
nRBC: 0 % (ref 0.0–0.2)

## 2021-05-25 LAB — COMPREHENSIVE METABOLIC PANEL
ALT: 13 U/L (ref 0–44)
AST: 13 U/L — ABNORMAL LOW (ref 15–41)
Albumin: 3.4 g/dL — ABNORMAL LOW (ref 3.5–5.0)
Alkaline Phosphatase: 60 U/L (ref 38–126)
Anion gap: 7 (ref 5–15)
BUN: 15 mg/dL (ref 8–23)
CO2: 24 mmol/L (ref 22–32)
Calcium: 9.2 mg/dL (ref 8.9–10.3)
Chloride: 107 mmol/L (ref 98–111)
Creatinine, Ser: 0.8 mg/dL (ref 0.61–1.24)
GFR, Estimated: >60 mL/min (ref >60)
Glucose, Bld: 99 mg/dL (ref 70–99)
Potassium: 4 mmol/L (ref 3.5–5.1)
Sodium: 138 mmol/L (ref 135–145)
Total Bilirubin: 0.5 mg/dL (ref 0.3–1.2)
Total Protein: 7 g/dL (ref 6.5–8.1)

## 2021-05-25 LAB — LACTIC ACID, PLASMA
Lactic Acid, Venous: 1.2 mmol/L (ref 0.5–1.9)
Lactic Acid, Venous: 0.7 mmol/L (ref 0.5–1.9)

## 2021-05-25 MED ORDER — SODIUM CHLORIDE 0.9 % IV SOLN
1.0000 g | Freq: Once | INTRAVENOUS | Status: AC
  Administered 2021-05-25: 1 g via INTRAVENOUS
  Filled 2021-05-25: qty 10

## 2021-05-25 MED ORDER — VANCOMYCIN HCL IN DEXTROSE 1-5 GM/200ML-% IV SOLN
1000.0000 mg | Freq: Once | INTRAVENOUS | Status: AC
  Administered 2021-05-25: 1000 mg via INTRAVENOUS
  Filled 2021-05-25: qty 200

## 2021-05-25 MED ORDER — DOXYCYCLINE HYCLATE 100 MG PO CAPS: 100.0000 mg | ORAL_CAPSULE | Freq: Two times a day (BID) | ORAL | 0 refills | Status: AC

## 2021-05-25 NOTE — ED Triage Notes (Signed)
Bleeding from rt stump hx of amputation  Feb of this year from work injury some oozing noted at this time and reddness

## 2021-05-25 NOTE — ED Notes (Signed)
[facility] called for consult [facility]

## 2021-05-25 NOTE — ED Notes (Signed)
Arrived with wife this am stating he awoke with some blood noted on his bed sheets from his rt stump, had amputation Rt AKA feb this year. States has not had any fevers or noted any drainage from site. Utilizes a prothesis to RLE without any issues or complications per pt and wife statement. No active bleeding currently noted at this time, area is slightly red, no drainage noted as well.

## 2021-05-25 NOTE — ED Provider Notes (Signed)
[facility] EMERGENCY DEPARTMENT Provider Note   CSN: [id number] Arrival date & time: 05/25/21  0848     History Chief Complaint  Patient presents with   Wound Check    Andres [name] is a 65 y.o. male presents with bleeding from right stump, history of amputation in February of this year.  Patient states that he woke up this morning and saw blood on the bandages from his stump.  Prior to this morning he had not noticed any fevers, or any drainage from the site.  He is using a new prosthesis without any issues or complications.  States the amputation was from a work injury, progressed to cellulitis and osteomyelitis requiring amputation.  Discussed his symptoms with his case manager, who recommended he be evaluated in the emergency department.  He is not on chronic anticoagulation.   Wound Check      Past Medical History:  Diagnosis Date   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[place]) 10/20/2020   Abscess of right lower leg    Accelerated hypertension 11/08/2018   Acute blood loss anemia    Allergic rhinitis 10/24/2011   Basal cell carcinoma    Basal cell carcinoma of back 07/10/2011   Cellulitis 10/05/2020   Cellulitis of right lower extremity 10/05/2020   Chest pain 11/08/2018   COVID    COVID-19 virus infection 10/05/2020   Drug induced constipation    Encounter to establish care 07/09/2017   Essential hypertension 08/16/2015   Chronic, not well controlled; I advised patient to increase losartan however he wants to work on diet and exercise more first - cont losartan 25mg  daily - check Bps and record - if Bp's continue to trend up patient to go up to 50mg  of losartan - check BMP today - Signs and symptoms of hypertensive crisis reviewed with patient, included but not limited to, worsening sudden onset headache, blurry vi   History of nonmelanoma skin cancer 07/15/2012   Hypertension    Hyponatremia    Impaired fasting glucose 08/23/2014   Chronic, last A1c was 6.0  - will recheck today   Necrotizing fasciitis of lower leg ([place])    Obesity (BMI 30-39.9) 08/23/2014   Post-operative pain    Prediabetes    Reactive depression    Sensorineural hearing loss (SNHL), bilateral 08/16/2015   Unilateral AKA, right ([place])     Patient Active Problem List   Diagnosis Date Noted   Basal cell carcinoma 02/26/2021   Hypertension 02/26/2021   Reactive depression    Drug induced constipation    Post-operative pain    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[place]) 10/20/2020   Unilateral AKA, right ([facility])    Acute blood loss anemia    Prediabetes    Necrotizing fasciitis of lower leg ([facility])    Abscess of right lower leg    Cellulitis 10/05/2020   Cellulitis of right lower extremity 10/05/2020   COVID-19 virus infection 10/05/2020   COVID    Hyponatremia    Chest pain 11/08/2018   Accelerated hypertension 11/08/2018   Encounter to establish care 07/09/2017   Essential hypertension 08/16/2015   Sensorineural hearing loss (SNHL), bilateral 08/16/2015   Impaired fasting glucose 08/23/2014   Obesity (BMI 30-39.9) 08/23/2014   History of nonmelanoma skin cancer 07/15/2012   Allergic rhinitis 10/24/2011   Basal cell carcinoma of back 07/10/2011    Past Surgical History:  Procedure Laterality Date   AMPUTATION Right 10/15/2020   Procedure: AMPUTATION ABOVE KNEE;  Surgeon: Newt Minion, MD;  Location: [place];  Service: Orthopedics;  Laterality: Right;   FASCIOTOMY Right 10/11/2020   Procedure: RIGHT LEG FASCIOTOMIES AND DEBRIDE ULCER;  Surgeon: Newt Minion, MD;  Location: [place];  Service: Orthopedics;  Laterality: Right;   I & D EXTREMITY Right 10/13/2020   Procedure: REPEAT DEBRIDEMENT RIGHT LEG;  Surgeon: Newt Minion, MD;  Location: [place];  Service: Orthopedics;  Laterality: Right;       Family History  Family history unknown: Yes    Social History   Tobacco Use   Smoking status: Former   Smokeless tobacco: Never  Scientific laboratory technician  Use: Never used  Substance Use Topics   Alcohol use: Not Currently   Drug use: Never    Home Medications Prior to Admission medications   Medication Sig Start Date End Date Taking? Authorizing Provider  doxycycline (VIBRAMYCIN) 100 MG capsule Take 1 capsule (100 mg total) by mouth 2 (two) times daily for 7 days. 05/25/21 06/01/21 Yes Dosha Broshears T, PA-C  Ascorbic Acid (VITAMIN C) 500 MG CHEW Chew 1 tablet by mouth daily.    [provider]  aspirin 81 MG chewable tablet Chew 1 tablet (81 mg total) by mouth daily. 11/10/18   Eugenie Filler, MD  B Complex Vitamins (B-COMPLEX/B-12) TABS Take 1 tablet by mouth daily.    [provider]  DULoxetine (CYMBALTA) 20 MG capsule Take 20 mg by mouth at bedtime. 02/06/21   [provider]  gabapentin (NEURONTIN) 400 MG capsule Take 400 mg by mouth 2 (two) times daily.    [provider]  losartan (COZAAR) 25 MG tablet Take 1 tablet (25 mg total) by mouth daily. 03/02/21   Revankar, Reita Cliche, MD    Allergies    Niacin and related  Review of Systems   Review of Systems  Constitutional:  Negative for chills and fever.  Skin:  Positive for wound.  All other systems reviewed and are negative.  Physical Exam Updated Vital Signs BP 128/72   Pulse (!) 59   Temp 98.6 F (37 C) (Oral)   Resp 15   Ht 5\' 11"  (1.803 m)   Wt 97.5 kg   SpO2 95%   BMI 29.99 kg/m   Physical Exam Vitals and nursing note reviewed.  Constitutional:      Appearance: Normal appearance.  HENT:     Head: Normocephalic and atraumatic.  Eyes:     Conjunctiva/sclera: Conjunctivae normal.  Cardiovascular:     Rate and Rhythm: Normal rate and regular rhythm.  Pulmonary:     Effort: Pulmonary effort is normal. No respiratory distress.     Breath sounds: Normal breath sounds.  Abdominal:     General: There is no distension.     Palpations: Abdomen is soft.     Tenderness: There is no abdominal tenderness.  Skin:    General: Skin is  warm and dry.     Comments: Increased redness and tenderness over incision site of right stump. Bleeding is controlled. No drainage of pus or fluctuance.   Neurological:     General: No focal deficit present.     Mental Status: He is alert.  Psychiatric:        Mood and Affect: Mood normal.        Behavior: Behavior normal.         ED Results / Procedures / Treatments   Labs (all labs ordered are listed, but only abnormal  results are displayed) Labs Reviewed  CBC WITH DIFFERENTIAL/PLATELET - Abnormal; Notable for the following components:      Result Value   HCT 38.8 (*)    All other components within normal limits  COMPREHENSIVE METABOLIC PANEL - Abnormal; Notable for the following components:   Albumin 3.4 (*)    AST 13 (*)    All other components within normal limits  CULTURE, BLOOD (ROUTINE X 2)  CULTURE, BLOOD (ROUTINE X 2)  LACTIC ACID, PLASMA  LACTIC ACID, PLASMA    EKG None  Radiology DG FEMUR PORT, MIN 2 VIEWS RIGHT  Result Date: 05/25/2021 CLINICAL DATA:  Blood on bed sheets associated with RIGHT stump, post RIGHT AKA earlier this year EXAM: RIGHT FEMUR PORTABLE 2 VIEW COMPARISON:  None FINDINGS: Osseous mineralization normal. Hip excluded. Prior RIGHT AKA. No acute fracture or dislocation. Minimal irregularity at the tip of the amputation stump of the distal femur, could be related to prior surgery though infection is not completely excluded. No soft tissue gas identified. IMPRESSION: Cortical irregularity at the stump of the RIGHT femur post AKA, question related to prior surgery but infection/osteomyelitis is not excluded; consider assessment by MR. Electronically Signed   By: Lavonia Dana M.D.   On: 05/25/2021 12:04    Procedures Procedures   Medications Ordered in ED Medications  cefTRIAXone (ROCEPHIN) 1 g in sodium chloride 0.9 % 100 mL IVPB (0 g Intravenous Stopped 05/25/21 1151)  vancomycin (VANCOCIN) IVPB 1000 mg/200 mL premix (0 mg Intravenous Stopped  05/25/21 1255)    ED Course  I have reviewed the triage vital signs and the nursing notes.  Pertinent labs & imaging results that were available during my care of the patient were reviewed by me and considered in my medical decision making (see chart for details).    MDM Rules/Calculators/A&P                           Patient is 65 y/o male who presents with right stump tenderness and bleeding s/p above knee amputation in February 2022.   Concern for cellulitis and osteomyelitis. Patient started on broad spectrum antibiotics. CBC showed no leukocytosis. CMP unremarkable. Lactic acid 1.2, follow up 0.7. XR showed cortical irregularity at base of right femur, recommended MRI for osteo rule out. No MRI available at this site, would require transfer.   Consulted with Dr Sharol Given who performed patient's amputation to discuss plan including possible admission. He recommended d/c with prescription for doxycycline and follow up in his clinic on Monday. Discussed with patient who is agreeable to plan.  Patient seen in conjunction with attending physician Dr Johnney Killian who agrees with above plan.   Final Clinical Impression(s) / ED Diagnoses Final diagnoses:  Cellulitis of right lower extremity    Rx / DC Orders ED Discharge Orders          Ordered    doxycycline (VIBRAMYCIN) 100 MG capsule  2 times daily        05/25/21 1440             Chistine Dematteo T, PA-C 05/25/21 1502    Charlesetta Shanks, MD 05/26/21 1325

## 2021-05-25 NOTE — Discharge Instructions (Addendum)
You were evaluated in the emergency department today for a wound check.  We got imaging and lab work evaluate this.  Your x-ray showed potential changes that needed to be confirmed by MRI.  We started you on broad-spectrum antibiotics.    I discussed with your surgeon Dr. Sharol Given who recommended I send you home with a prescription for doxycycline, which is an antibiotic, that you can take for the next 7 days.  He also recommended you follow-up in his clinic on Monday.

## 2021-05-25 NOTE — ED Notes (Signed)
Blood Cultures x 2 obtained prior to Abx administration 

## 2021-05-26 ENCOUNTER — Telehealth (HOSPITAL_BASED_OUTPATIENT_CLINIC_OR_DEPARTMENT_OTHER): Payer: Self-pay | Admitting: Emergency Medicine

## 2021-05-26 ENCOUNTER — Emergency Department (HOSPITAL_BASED_OUTPATIENT_CLINIC_OR_DEPARTMENT_OTHER)
Admission: EM | Admit: 2021-05-26 | Discharge: 2021-05-26 | Disposition: A | Discharge status: Home / Self Care | Payer: Worker's Compensation | Attending: Emergency Medicine | Admitting: Emergency Medicine

## 2021-05-26 ENCOUNTER — Other Ambulatory Visit: Payer: Self-pay

## 2021-05-26 ENCOUNTER — Encounter (HOSPITAL_BASED_OUTPATIENT_CLINIC_OR_DEPARTMENT_OTHER): Payer: Self-pay | Admitting: Emergency Medicine

## 2021-05-26 DIAGNOSIS — I10 Essential (primary) hypertension: Secondary | ICD-10-CM | POA: Insufficient documentation

## 2021-05-26 DIAGNOSIS — Z87891 Personal history of nicotine dependence: Secondary | ICD-10-CM | POA: Diagnosis not present

## 2021-05-26 DIAGNOSIS — Z8616 Personal history of COVID-19: Secondary | ICD-10-CM | POA: Insufficient documentation

## 2021-05-26 DIAGNOSIS — R7989 Other specified abnormal findings of blood chemistry: Secondary | ICD-10-CM

## 2021-05-26 DIAGNOSIS — R7881 Bacteremia: Secondary | ICD-10-CM | POA: Insufficient documentation

## 2021-05-26 DIAGNOSIS — L03115 Cellulitis of right lower limb: Secondary | ICD-10-CM | POA: Insufficient documentation

## 2021-05-26 DIAGNOSIS — Z7982 Long term (current) use of aspirin: Secondary | ICD-10-CM | POA: Diagnosis not present

## 2021-05-26 DIAGNOSIS — Z09 Encounter for follow-up examination after completed treatment for conditions other than malignant neoplasm: Secondary | ICD-10-CM | POA: Insufficient documentation

## 2021-05-26 DIAGNOSIS — Z85828 Personal history of other malignant neoplasm of skin: Secondary | ICD-10-CM | POA: Diagnosis not present

## 2021-05-26 DIAGNOSIS — Z79899 Other long term (current) drug therapy: Secondary | ICD-10-CM | POA: Insufficient documentation

## 2021-05-26 LAB — BASIC METABOLIC PANEL
Anion gap: 7 (ref 5–15)
BUN: 18 mg/dL (ref 8–23)
CO2: 25 mmol/L (ref 22–32)
Calcium: 8.9 mg/dL (ref 8.9–10.3)
Chloride: 104 mmol/L (ref 98–111)
Creatinine, Ser: 0.81 mg/dL (ref 0.61–1.24)
GFR, Estimated: >60 mL/min (ref >60)
Glucose, Bld: 111 mg/dL — ABNORMAL HIGH (ref 70–99)
Potassium: 4.1 mmol/L (ref 3.5–5.1)
Sodium: 136 mmol/L (ref 135–145)

## 2021-05-26 LAB — BLOOD CULTURE ID PANEL (REFLEXED) - BCID2

## 2021-05-26 LAB — CBC WITH DIFFERENTIAL/PLATELET
Abs Immature Granulocytes: 0.01 10*3/uL (ref 0.00–0.07)
Basophils Absolute: 0.1 10*3/uL (ref 0.0–0.1)
Basophils Relative: 1 %
Eosinophils Absolute: 0.3 10*3/uL (ref 0.0–0.5)
Eosinophils Relative: 5 %
HCT: 37.1 % — ABNORMAL LOW (ref 39.0–52.0)
Hemoglobin: 12.6 g/dL — ABNORMAL LOW (ref 13.0–17.0)
Immature Granulocytes: 0 %
Lymphocytes Relative: 41 %
Lymphs Abs: 2.5 10*3/uL (ref 0.7–4.0)
MCH: 29.9 pg (ref 26.0–34.0)
MCHC: 34 g/dL (ref 30.0–36.0)
MCV: 88.1 fL (ref 80.0–100.0)
Monocytes Absolute: 0.5 10*3/uL (ref 0.1–1.0)
Monocytes Relative: 8 %
Neutro Abs: 2.8 10*3/uL (ref 1.7–7.7)
Neutrophils Relative %: 45 %
Platelets: 257 10*3/uL (ref 150–400)
RBC: 4.21 MIL/uL — ABNORMAL LOW (ref 4.22–5.81)
RDW: 13 % (ref 11.5–15.5)
WBC: 6.2 10*3/uL (ref 4.0–10.5)
nRBC: 0 % (ref 0.0–0.2)

## 2021-05-26 LAB — LACTIC ACID, PLASMA: Lactic Acid, Venous: 1.1 mmol/L (ref 0.5–1.9)

## 2021-05-26 MED ORDER — CEPHALEXIN 500 MG PO CAPS: 1000.0000 mg | ORAL_CAPSULE | Freq: Two times a day (BID) | ORAL | 0 refills | Status: DC

## 2021-05-26 MED ORDER — VANCOMYCIN HCL IN DEXTROSE 1-5 GM/200ML-% IV SOLN
1000.0000 mg | Freq: Once | INTRAVENOUS | Status: AC
  Administered 2021-05-26: 1000 mg via INTRAVENOUS
  Filled 2021-05-26: qty 200

## 2021-05-26 MED ORDER — SODIUM CHLORIDE 0.9 % IV SOLN
INTRAVENOUS | Status: DC | PRN
  Administered 2021-05-26: 250 mL via INTRAVENOUS

## 2021-05-26 MED ORDER — SODIUM CHLORIDE 0.9 % IV SOLN
1.0000 g | Freq: Once | INTRAVENOUS | Status: AC
  Administered 2021-05-26: 1 g via INTRAVENOUS
  Filled 2021-05-26: qty 10

## 2021-05-26 NOTE — Discharge Instructions (Signed)
1.  Repeat blood culture has been done. 2.  Add Keflex to your antibiotics and continue the doxycycline as prescribed. 3.  Return to the emergency department if you develop a fever or any signs of general illness or worsening of your cellulitis. 4.  Plan to see Dr. Sharol Given on Monday.

## 2021-05-26 NOTE — ED Provider Notes (Signed)
[facility] Provider Note   CSN: [id number] Arrival date & time: 05/26/21  1212     History Chief Complaint  Patient presents with   Follow-up    Andres [name] is a 65 y.o. male.  HPI Patient was seen by myself yesterday with PA-C Roemhidt.  Blood cultures today had a positive in 1 bottle for strep species.  Patient returns for reassessment.  He has not developed any constitutional symptoms.  Patient reports he feels well today there is been no fever no chills.  The area of suspected cellulitis appears to be improving.  Patient is taking doxycycline as prescribed.    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   Abscess of right lower leg    Accelerated hypertension 11/08/2018   Acute blood loss anemia    Allergic rhinitis 10/24/2011   Basal cell carcinoma    Basal cell carcinoma of back 07/10/2011   Cellulitis 10/05/2020   Cellulitis of right lower extremity 10/05/2020   Chest pain 11/08/2018   COVID    COVID-19 virus infection 10/05/2020   Drug induced constipation    Encounter to establish care 07/09/2017   Essential hypertension 08/16/2015   Chronic, not well controlled; I advised patient to increase losartan however he wants to work on diet and exercise more first - cont losartan 25mg  daily - check Bps and record - if Bp's continue to trend up patient to go up to 50mg  of losartan - check BMP today - Signs and symptoms of hypertensive crisis reviewed with patient, included but not limited to, worsening sudden onset headache, blurry vi   History of nonmelanoma skin cancer 07/15/2012   Hypertension    Hyponatremia    Impaired fasting glucose 08/23/2014   Chronic, last A1c was 6.0 - will recheck today   Necrotizing fasciitis of lower leg ([place])    Obesity (BMI 30-39.9) 08/23/2014   Post-operative pain    Prediabetes    Reactive depression    Sensorineural hearing loss (SNHL), bilateral  08/16/2015   Unilateral AKA, right ([place])     Patient Active Problem List   Diagnosis Date Noted   Basal cell carcinoma 02/26/2021   Hypertension 02/26/2021   Reactive depression    Drug induced constipation    Post-operative pain    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   Unilateral AKA, right (HCC)    Acute blood loss anemia    Prediabetes    Necrotizing fasciitis of lower leg (HCC)    Abscess of right lower leg    Cellulitis 10/05/2020   Cellulitis of right lower extremity 10/05/2020   COVID-19 virus infection 10/05/2020   COVID    Hyponatremia    Chest pain 11/08/2018   Accelerated hypertension 11/08/2018   Encounter to establish care 07/09/2017   Essential hypertension 08/16/2015   Sensorineural hearing loss (SNHL), bilateral 08/16/2015   Impaired fasting glucose 08/23/2014   Obesity (BMI 30-39.9) 08/23/2014   History of nonmelanoma skin cancer 07/15/2012   Allergic rhinitis 10/24/2011   Basal cell carcinoma of back 07/10/2011    Past Surgical History:  Procedure Laterality Date   AMPUTATION Right 10/15/2020   Procedure: AMPUTATION ABOVE KNEE;  Surgeon: Newt Minion, MD;  Location: [facility];  Service: Orthopedics;  Laterality: Right;   FASCIOTOMY Right 10/11/2020   Procedure: RIGHT LEG FASCIOTOMIES AND DEBRIDE ULCER;  Surgeon: Newt Minion, MD;  Location: [facility];  Service: Orthopedics;  Laterality: Right;   I & D EXTREMITY Right 10/13/2020   Procedure: REPEAT DEBRIDEMENT RIGHT LEG;  Surgeon: Newt Minion, MD;  Location: [facility];  Service: Orthopedics;  Laterality: Right;       Family History  Family history unknown: Yes    Social History   Tobacco Use   Smoking status: Former   Smokeless tobacco: Never  Scientific laboratory technician Use: Never used  Substance Use Topics   Alcohol use: Not Currently   Drug use: Never    Home Medications Prior to Admission medications   Medication Sig Start Date End Date Taking? Authorizing Provider   Ascorbic Acid (VITAMIN C) 500 MG CHEW Chew 1 tablet by mouth daily.    [provider]  aspirin 81 MG chewable tablet Chew 1 tablet (81 mg total) by mouth daily. 11/10/18   Eugenie Filler, MD  B Complex Vitamins (B-COMPLEX/B-12) TABS Take 1 tablet by mouth daily.    [provider]  doxycycline (VIBRAMYCIN) 100 MG capsule Take 1 capsule (100 mg total) by mouth 2 (two) times daily for 7 days. 05/25/21 06/01/21  Roemhildt, Lorin T, PA-C  DULoxetine (CYMBALTA) 20 MG capsule Take 20 mg by mouth at bedtime. 02/06/21   [provider]  gabapentin (NEURONTIN) 400 MG capsule Take 400 mg by mouth 2 (two) times daily.    [provider]  losartan (COZAAR) 25 MG tablet Take 1 tablet (25 mg total) by mouth daily. 03/02/21   Revankar, Reita Cliche, MD    Allergies    Niacin and related  Review of Systems   Review of Systems 10 Systems reviewed and negative except as per HPI Physical Exam Updated Vital Signs BP (!) 143/79 (BP Location: Right Arm)   Pulse 63   Temp 97.7 F (36.5 C) (Oral)   Resp 16   Ht 5\' 11"  (1.803 m)   Wt 97.5 kg   SpO2 98%   BMI 29.99 kg/m   Physical Exam Constitutional:      Appearance: Normal appearance.  HENT:     Mouth/Throat:     Pharynx: Oropharynx is clear.  Cardiovascular:     Rate and Rhythm: Normal rate and regular rhythm.  Pulmonary:     Effort: Pulmonary effort is normal.     Breath sounds: Normal breath sounds.  Abdominal:     General: There is no distension.     Palpations: Abdomen is soft.     Tenderness: There is no abdominal tenderness. There is no guarding.  Musculoskeletal:     Comments: The area of cellulitis on the amputation of the right lower extremity is improved relative to yesterday.  See attached images.  Skin:    General: Skin is warm and dry.  Neurological:     General: No focal deficit present.     Mental Status: He is alert and oriented to person, place, and time.  Psychiatric:        Mood and  Affect: Mood normal.     ED Results / Procedures / Treatments   Labs (all labs ordered are listed, but only abnormal results are displayed) Labs Reviewed  BASIC METABOLIC PANEL - Abnormal; Notable for the following components:      Result Value   Glucose, Bld 111 (*)    All other components within normal limits  CBC WITH DIFFERENTIAL/PLATELET - Abnormal; Notable for the following components:   RBC 4.21 (*)    Hemoglobin 12.6 (*)  HCT 37.1 (*)    All other components within normal limits  CULTURE, BLOOD (ROUTINE X 2)  CULTURE, BLOOD (ROUTINE X 2)  LACTIC ACID, PLASMA    EKG None  Radiology DG FEMUR PORT, MIN 2 VIEWS RIGHT  Result Date: 05/25/2021 CLINICAL DATA:  Blood on bed sheets associated with RIGHT stump, post RIGHT AKA earlier this year EXAM: RIGHT FEMUR PORTABLE 2 VIEW COMPARISON:  None FINDINGS: Osseous mineralization normal. Hip excluded. Prior RIGHT AKA. No acute fracture or dislocation. Minimal irregularity at the tip of the amputation stump of the distal femur, could be related to prior surgery though infection is not completely excluded. No soft tissue gas identified. IMPRESSION: Cortical irregularity at the stump of the RIGHT femur post AKA, question related to prior surgery but infection/osteomyelitis is not excluded; consider assessment by MR. Electronically Signed   By: Lavonia Dana M.D.   On: 05/25/2021 12:04    Procedures Procedures   Medications Ordered in ED Medications  0.9 %  sodium chloride infusion (250 mLs Intravenous New Bag/Given 05/26/21 1437)  vancomycin (VANCOCIN) IVPB 1000 mg/200 mL premix (1,000 mg Intravenous New Bag/Given 05/26/21 1600)  cefTRIAXone (ROCEPHIN) 1 g in sodium chloride 0.9 % 100 mL IVPB (0 g Intravenous Stopped 05/26/21 1516)    ED Course  I have reviewed the triage vital signs and the nursing notes.  Pertinent labs & imaging results that were available during my care of the patient were reviewed by me and considered in my  medical decision making (see chart for details).    MDM Rules/Calculators/A&P                           This time plan will be to administer another dose of Rocephin and vancomycin in the emergency department.  Cultures have been repeated.  Patient shows no constitutional symptoms of bacteremia and strep species may be contaminant.  Plan will be to continue doxycycline and add Keflex.  Return precautions will remain the same and plan will remain to follow-up with Dr. Sharol Given Final Clinical Impression(s) / ED Diagnoses Final diagnoses:  Blood culture positive for microorganism  Cellulitis of right lower extremity    Rx / DC Orders ED Discharge Orders     None        Charlesetta Shanks, MD 05/26/21 1614

## 2021-05-26 NOTE — ED Provider Notes (Signed)
  Physical Exam  BP 117/78 (BP Location: Right Arm)   Pulse 62   Temp 97.7 F (36.5 C) (Oral)   Resp 16   Ht 5\' 11"  (1.803 m)   Wt 97.5 kg   SpO2 99%   BMI 29.99 kg/m   Physical Exam  ED Course/Procedures     Procedures  MDM  Care assumed at 3 PM.  Patient was called back for positive blood culture.  He was thought to be contaminant.  Patient is afebrile.  Repeat white blood cell count is normal.  Repeat culture sent. Signout pending vancomycin loading and Rocephin.  Patient is already on doxycycline.  Plan to add Keflex.  5:08 PM Patient received vancomycin and Rocephin.  Patient has appointment with podiatry in 2 days.  DC paperwork done by previous provider       Drenda Freeze, MD 05/26/21 [phone]

## 2021-05-26 NOTE — ED Triage Notes (Signed)
Pt seen here yesterday.  Was told to return to ED due to positive blood cultures.

## 2021-05-28 ENCOUNTER — Other Ambulatory Visit: Payer: Self-pay

## 2021-05-28 ENCOUNTER — Ambulatory Visit (INDEPENDENT_AMBULATORY_CARE_PROVIDER_SITE_OTHER): Payer: Worker's Compensation | Admitting: Orthopedic Surgery

## 2021-05-28 DIAGNOSIS — S78111A Complete traumatic amputation at level between right hip and knee, initial encounter: Secondary | ICD-10-CM | POA: Diagnosis not present

## 2021-05-29 ENCOUNTER — Encounter: Payer: Self-pay | Admitting: Orthopedic Surgery

## 2021-05-29 LAB — CULTURE, BLOOD (ROUTINE X 2): Special Requests: ADEQUATE

## 2021-05-29 NOTE — Progress Notes (Signed)
Office Visit Note   Patient: Andres [name]           Date of Birth: July 30, 1956           MRN: [id number] Visit Date: 05/28/2021              Requested by: Mackie Pai, PA-C [place],  [place] [address] PCP: Mackie Pai, PA-C  Chief Complaint  Patient presents with   Right Leg - Follow-up    10/15/20 right AKA to [facility] in [place] over the weekend for cellulitis.       HPI: Patient is a 65 year old gentleman who is 7 months status post right above-the-knee amputation.  Patient states he had acute pain with some bleeding and presented to [facility].  Patient was given vancomycin and Rocephin and discharged on doxycycline to follow-up in the office this morning.  By report stat labs were positive for bacteria and patient received the IV antibiotics.  Assessment & Plan: Visit Diagnoses:  1. 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[place])     Plan: Patient will continue with the doxycycline we will have him minimize the use of the prosthesis which was made by [employer].  Follow-up in 3 weeks or sooner if he develops in a signs or symptoms of an infection.  Follow-Up Instructions: Return in about 3 weeks (around 06/18/2021).   Ortho Exam  Patient is alert, oriented, no adenopathy, well-dressed, normal affect, normal respiratory effort. Patient has no tenderness to palpation over the residual limb.  He has good hair growth there is no drainage or cellulitis.  He has no fever or chills states that he feels well.  He states that his symptoms were acute bleeding on the socket liner.  Imaging: No results found. No images are attached to the encounter.  Labs: Lab Results  Component Value Date   HGBA1C 5.9 (H) 10/18/2020   HGBA1C 5.9 (H) 11/08/2018   ESRSEDRATE 61 (H) 12/21/2020   ESRSEDRATE 101 (H) 10/14/2020   CRP <1.0 12/21/2020   CRP 8.6 (H) 10/14/2020   CRP 5.2 (H) 10/10/2020   REPTSTATUS PENDING 05/26/2021   GRAMSTAIN   10/13/2020    FEW WBC PRESENT, PREDOMINANTLY PMN NO ORGANISMS SEEN    CULT  05/26/2021    NO GROWTH 2 DAYS Performed at [facility], [place] [address], [place], [place] [address]    LABORGA METHICILLIN RESISTANT STAPHYLOCOCCUS AUREUS 10/11/2020     Lab Results  Component Value Date   ALBUMIN 3.4 (L) 05/25/2021   ALBUMIN 2.2 (L) 10/21/2020   ALBUMIN 2.2 (L) 10/10/2020    No results found for: MG No results found for: VD25OH  No results found for: PREALBUMIN CBC EXTENDED Latest Ref Rng & Units 05/26/2021 05/25/2021 12/20/2020  WBC 4.0 - 10.5 K/uL 6.2 6.7 4.8  RBC 4.22 - 5.81 MIL/uL 4.21(L) 4.43 4.43  HGB 13.0 - 17.0 g/dL 12.6(L) 13.4 12.9(L)  HCT 39.0 - 52.0 % 37.1(L) 38.8(L) 39.0  PLT 150 - 400 K/uL 257 257 224.0  NEUTROABS 1.7 - 7.7 K/uL 2.8 3.3 2.2  LYMPHSABS 0.7 - 4.0 K/uL 2.5 2.5 2.1     There is no height or weight on file to calculate BMI.  Orders:  No orders of the defined types were placed in this encounter.  No orders of the defined types were placed in this encounter.    Procedures: No procedures performed  Clinical Data: No additional findings.  ROS:  All other systems negative, except as noted in the HPI. Review of Systems  Objective: Vital Signs: There were no vitals taken for this visit.  Specialty Comments:  No specialty comments available.  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/2020   Unilateral AKA, right (HCC)    Acute blood loss anemia    Prediabetes    Necrotizing fasciitis of lower leg (HCC)    Abscess of right lower leg    Cellulitis 10/05/2020   Cellulitis of right lower extremity 10/05/2020   COVID-19 virus infection 10/05/2020   COVID    Hyponatremia    Chest pain 11/08/2018   Accelerated  hypertension 11/08/2018   Encounter to establish care 07/09/2017   Essential hypertension 08/16/2015   Sensorineural hearing loss (SNHL), bilateral 08/16/2015   Impaired fasting glucose 08/23/2014   Obesity (BMI 30-39.9) 08/23/2014   History of nonmelanoma skin cancer 07/15/2012   Allergic rhinitis 10/24/2011   Basal cell carcinoma of back 07/10/2011   Past Medical History:  Diagnosis Date   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   Abscess of right lower leg    Accelerated hypertension 11/08/2018   Acute blood loss anemia    Allergic rhinitis 10/24/2011   Basal cell carcinoma    Basal cell carcinoma of back 07/10/2011   Cellulitis 10/05/2020   Cellulitis of right lower extremity 10/05/2020   Chest pain 11/08/2018   COVID    COVID-19 virus infection 10/05/2020   Drug induced constipation    Encounter to establish care 07/09/2017   Essential hypertension 08/16/2015   Chronic, not well controlled; I advised patient to increase losartan however he wants to work on diet and exercise more first - cont losartan 25mg  daily - check Bps and record - if Bp's continue to trend up patient to go up to 50mg  of losartan - check BMP today - Signs and symptoms of hypertensive crisis reviewed with patient, included but not limited to, worsening sudden onset headache, blurry vi   History of nonmelanoma skin cancer 07/15/2012   Hypertension    Hyponatremia    Impaired fasting glucose 08/23/2014   Chronic, last A1c was 6.0 - will recheck today   Necrotizing fasciitis of lower leg ([place])    Obesity (BMI 30-39.9) 08/23/2014   Post-operative pain    Prediabetes    Reactive depression    Sensorineural hearing loss (SNHL), bilateral 08/16/2015   Unilateral AKA, right ([place])     Family History  Family history unknown: Yes    Past Surgical History:  Procedure Laterality Date   AMPUTATION Right 10/15/2020   Procedure: AMPUTATION ABOVE KNEE;  Surgeon: Newt Minion, MD;  Location: [facility];   Service: Orthopedics;  Laterality: Right;   FASCIOTOMY Right 10/11/2020   Procedure: RIGHT LEG FASCIOTOMIES AND DEBRIDE ULCER;  Surgeon: Newt Minion, MD;  Location: [facility];  Service: Orthopedics;  Laterality: Right;   I & D EXTREMITY Right 10/13/2020   Procedure: REPEAT DEBRIDEMENT RIGHT LEG;  Surgeon: Newt Minion, MD;  Location: [facility];  Service: Orthopedics;  Laterality: Right;   Social History   Occupational History   Not on file  Tobacco Use   Smoking status: Former   Smokeless tobacco: Never  Media planner  Vaping Use: Never used  Substance and Sexual Activity   Alcohol use: Not Currently   Drug use: Never   Sexual activity: Yes

## 2021-05-30 ENCOUNTER — Telehealth: Payer: Self-pay

## 2021-05-30 LAB — CULTURE, BLOOD (ROUTINE X 2)
Culture: NO GROWTH
Special Requests: ADEQUATE

## 2021-05-30 NOTE — Telephone Encounter (Signed)
Post ED Visit - Positive Culture Follow-up  Culture report reviewed by antimicrobial stewardship pharmacist: [facility] Team [x]  Joetta Manners, Pharm.D. BCCCP []  Heide Guile, Pharm.D., BCPS AQ-ID []  Parks Neptune, Pharm.D., BCPS []  Alycia Rossetti, Pharm.D., BCPS []  [place], Pharm.D., BCPS, AAHIVP []  Legrand Como, Pharm.D., BCPS, AAHIVP []  Salome Arnt, PharmD, BCPS []  Johnnette Gourd, PharmD, BCPS []  Hughes Better, PharmD, BCPS []  Leeroy Cha, PharmD []  Laqueta Linden, PharmD, BCPS []  Albertina Parr, PharmD  [place] Team []  Leodis Sias, PharmD []  Lindell Spar, PharmD []  Royetta Asal, PharmD []  Graylin Shiver, Rph []  Rema Fendt) Glennon Mac, PharmD []  Arlyn Dunning, PharmD []  Netta Cedars, PharmD []  Dia Sitter, PharmD []  Leone Haven, PharmD []  Gretta Arab, PharmD []  Theodis Shove, PharmD []  Peggyann Juba, PharmD []  Reuel Boom, PharmD   Positive blood culture Treated with Doxycycline Hyclate., organism sensitive to the same. Addressed by MD already - returned for ED re-assessment. Cephalexin added outpatient. No further patient follow-up is required at this time.  Glennon Hamilton 05/30/2021, 9:20 AM

## 2021-05-31 LAB — CULTURE, BLOOD (ROUTINE X 2)
Culture: NO GROWTH
Culture: NO GROWTH
Special Requests: ADEQUATE
Special Requests: ADEQUATE

## 2021-06-21 ENCOUNTER — Ambulatory Visit (INDEPENDENT_AMBULATORY_CARE_PROVIDER_SITE_OTHER): Payer: Worker's Compensation | Admitting: Orthopedic Surgery

## 2021-06-21 DIAGNOSIS — S78111A Complete traumatic amputation at level between right hip and knee, initial encounter: Secondary | ICD-10-CM | POA: Diagnosis not present

## 2021-06-25 ENCOUNTER — Encounter: Payer: Self-pay | Admitting: Orthopedic Surgery

## 2021-06-25 NOTE — Progress Notes (Signed)
Office Visit Note   Patient: Andres [name]           Date of Birth: [birth year]           MRN: [id number] Visit Date: 06/21/2021              Requested by: Mackie Pai, PA-C [place],  [place] [address] PCP: Mackie Pai, PA-C  Chief Complaint  Patient presents with   Right Leg - Follow-up      HPI: Patient is a 65 year old gentleman who presents in follow-up for a right above-the-knee amputation.  Previously did have cellulitis.  Patient has a prosthesis fabricated by [employer].  Assessment & Plan: Visit Diagnoses:  1. 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[place])     Plan: Patient will follow-up with limb bionics to modify the socket to decrease pressure off the end of the residual limb.  Follow-Up Instructions: Return if symptoms worsen or fail to improve.   Ortho Exam  Patient is alert, oriented, no adenopathy, well-dressed, normal affect, normal respiratory effort. Examination the cellulitis has resolved there is no fluid no ulcers there is no tenderness to palpation.  Imaging: No results found. No images are attached to the encounter.  Labs: Lab Results  Component Value Date   HGBA1C 5.9 (H) 10/18/2020   HGBA1C 5.9 (H) 11/08/2018   ESRSEDRATE 61 (H) 12/21/2020   ESRSEDRATE 101 (H) 10/14/2020   CRP <1.0 12/21/2020   CRP 8.6 (H) 10/14/2020   CRP 5.2 (H) 10/10/2020   REPTSTATUS 05/31/2021 FINAL 05/26/2021   GRAMSTAIN  10/13/2020    FEW WBC PRESENT, PREDOMINANTLY PMN NO ORGANISMS SEEN    CULT  05/26/2021    NO GROWTH 5 DAYS Performed at [facility], [place] [address], [place], [place] [address]    LABORGA METHICILLIN RESISTANT STAPHYLOCOCCUS AUREUS 10/11/2020     Lab Results  Component Value Date   ALBUMIN 3.4 (L) 05/25/2021   ALBUMIN 2.2 (L) 10/21/2020   ALBUMIN 2.2 (L) 10/10/2020    No results found for: MG No results found for: VD25OH  No results found for: PREALBUMIN CBC EXTENDED Latest  Ref Rng & Units 05/26/2021 05/25/2021 12/20/2020  WBC 4.0 - 10.5 K/uL 6.2 6.7 4.8  RBC 4.22 - 5.81 MIL/uL 4.21(L) 4.43 4.43  HGB 13.0 - 17.0 g/dL 12.6(L) 13.4 12.9(L)  HCT 39.0 - 52.0 % 37.1(L) 38.8(L) 39.0  PLT 150 - 400 K/uL 257 257 224.0  NEUTROABS 1.7 - 7.7 K/uL 2.8 3.3 2.2  LYMPHSABS 0.7 - 4.0 K/uL 2.5 2.5 2.1     There is no height or weight on file to calculate BMI.  Orders:  No orders of the defined types were placed in this encounter.  No orders of the defined types were placed in this encounter.    Procedures: No procedures performed  Clinical Data: No additional findings.  ROS:  All other systems negative, except as noted in the HPI. Review of Systems  Objective: Vital Signs: There were no vitals taken for this visit.  Specialty Comments:  No specialty comments available.  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[place]) 10/20/2020   Unilateral AKA, right (HCC)    Acute blood loss anemia    Prediabetes  Necrotizing fasciitis of lower leg (HCC)    Abscess of right lower leg    Cellulitis 10/05/2020   Cellulitis of right lower extremity 10/05/2020   COVID-19 virus infection 10/05/2020   COVID    Hyponatremia    Chest pain 11/08/2018   Accelerated hypertension 11/08/2018   Encounter to establish care 07/09/2017   Essential hypertension 08/16/2015   Sensorineural hearing loss (SNHL), bilateral 08/16/2015   Impaired fasting glucose 08/23/2014   Obesity (BMI 30-39.9) 08/23/2014   History of nonmelanoma skin cancer 07/15/2012   Allergic rhinitis 10/24/2011   Basal cell carcinoma of back 07/10/2011   Past Medical History:  Diagnosis Date   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[place]) 10/20/2020   Abscess of right lower leg     Accelerated hypertension 11/08/2018   Acute blood loss anemia    Allergic rhinitis 10/24/2011   Basal cell carcinoma    Basal cell carcinoma of back 07/10/2011   Cellulitis 10/05/2020   Cellulitis of right lower extremity 10/05/2020   Chest pain 11/08/2018   COVID    COVID-19 virus infection 10/05/2020   Drug induced constipation    Encounter to establish care 07/09/2017   Essential hypertension 08/16/2015   Chronic, not well controlled; I advised patient to increase losartan however he wants to work on diet and exercise more first - cont losartan 25mg  daily - check Bps and record - if Bp's continue to trend up patient to go up to 50mg  of losartan - check BMP today - Signs and symptoms of hypertensive crisis reviewed with patient, included but not limited to, worsening sudden onset headache, blurry vi   History of nonmelanoma skin cancer 07/15/2012   Hypertension    Hyponatremia    Impaired fasting glucose 08/23/2014   Chronic, last A1c was 6.0 - will recheck today   Necrotizing fasciitis of lower leg (HCC)    Obesity (BMI 30-39.9) 08/23/2014   Post-operative pain    Prediabetes    Reactive depression    Sensorineural hearing loss (SNHL), bilateral 08/16/2015   Unilateral AKA, right ([place])     Family History  Family history unknown: Yes    Past Surgical History:  Procedure Laterality Date   AMPUTATION Right 10/15/2020   Procedure: AMPUTATION ABOVE KNEE;  Surgeon: Newt Minion, MD;  Location: [facility];  Service: Orthopedics;  Laterality: Right;   FASCIOTOMY Right 10/11/2020   Procedure: RIGHT LEG FASCIOTOMIES AND DEBRIDE ULCER;  Surgeon: Newt Minion, MD;  Location: [facility];  Service: Orthopedics;  Laterality: Right;   I & D EXTREMITY Right 10/13/2020   Procedure: REPEAT DEBRIDEMENT RIGHT LEG;  Surgeon: Newt Minion, MD;  Location: [facility];  Service: Orthopedics;  Laterality: Right;   Social History   Occupational History   Not on file  Tobacco Use   Smoking status: Former   Smokeless  tobacco: Never  Vaping Use   Vaping Use: Never used  Substance and Sexual Activity   Alcohol use: Not Currently   Drug use: Never   Sexual activity: Yes

## 2021-09-11 ENCOUNTER — Other Ambulatory Visit: Payer: Self-pay

## 2021-09-11 ENCOUNTER — Encounter: Payer: Self-pay | Admitting: Cardiology

## 2021-09-11 ENCOUNTER — Ambulatory Visit: Payer: BLUE CROSS/BLUE SHIELD | Admitting: Cardiology

## 2021-09-11 VITALS — BP 154/72 | HR 74 | Ht 71.0 in | Wt 230.0 lb

## 2021-09-11 DIAGNOSIS — S78111A Complete traumatic amputation at level between right hip and knee, initial encounter: Secondary | ICD-10-CM

## 2021-09-11 DIAGNOSIS — E669 Obesity, unspecified: Secondary | ICD-10-CM | POA: Diagnosis not present

## 2021-09-11 DIAGNOSIS — I10 Essential (primary) hypertension: Secondary | ICD-10-CM | POA: Diagnosis not present

## 2021-09-11 DIAGNOSIS — Z89611 Acquired absence of right leg above knee: Secondary | ICD-10-CM | POA: Diagnosis not present

## 2021-09-11 NOTE — Progress Notes (Signed)
Cardiology Office Note:    Date:  09/11/2021   ID:  Andres [name], DOB 15-Jul-1956, MRN [id number]  PCP:  Mackie Pai, PA-C  Cardiologist:  Jenean Lindau, MD   Referring MD: Mackie Pai, PA-C    ASSESSMENT:    1. Essential hypertension   2. Obesity (BMI 30-39.9)   3. 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HCC)    PLAN:    In order of problems listed above:  Primary prevention stressed with the patient at length.  Importance of compliance with diet medication stressed at this time.  He was advised to walk and exercise on a regular basis. Essential hypertension: Blood pressure is well.  He has an element of whitecoat hypertension.  He mentions blood pressure readings at home and they are fine. Obesity: Weight reduction was stressed and risks of obesity emphasized.  He has abdominal obesity. I also reviewed lab work from the [employer] and discussed with him.He will have blood work with soon and send me a copy.  Medication Adjustments/Labs and Tests Ordered: Current medicines are reviewed at length with the patient today.  Concerns regarding medicines are outlined above.  No orders of the defined types were placed in this encounter.  No orders of the defined types were placed in this encounter.    No chief complaint on file.    History of Present Illness:    Andres [name] is a 66 y.o. male.  Patient medical history of essential hypertension and obesity.  He has undergone above-knee amputation of medical extremity.  He denies any problems at this time and takes care of activities of daily living.  No chest pain orthopnea or PND.  At the time of my evaluation, the patient is alert awake oriented and in no distress.  Past Medical History:  Diagnosis Date   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[place]) 10/20/2020   Abscess of right lower leg    Accelerated hypertension 11/08/2018   Acute blood loss anemia    Allergic rhinitis 10/24/2011   Basal  cell carcinoma    Basal cell carcinoma of back 07/10/2011   Cellulitis 10/05/2020   Cellulitis of right lower extremity 10/05/2020   Chest pain 11/08/2018   COVID    COVID-19 virus infection 10/05/2020   Drug induced constipation    Encounter to establish care 07/09/2017   Essential hypertension 08/16/2015   Chronic, not well controlled; I advised patient to increase losartan however he wants to work on diet and exercise more first - cont losartan 25mg  daily - check Bps and record - if Bp's continue to trend up patient to go up to 50mg  of losartan - check BMP today - Signs and symptoms of hypertensive crisis reviewed with patient, included but not limited to, worsening sudden onset headache, blurry vi   History of nonmelanoma skin cancer 07/15/2012   Hypertension    Hyponatremia    Impaired fasting glucose 08/23/2014   Chronic, last A1c was 6.0 - will recheck today   Necrotizing fasciitis of lower leg ([place])    Obesity (BMI 30-39.9) 08/23/2014   Post-operative pain    Prediabetes    Reactive depression    Sensorineural hearing loss (SNHL), bilateral 08/16/2015   Unilateral AKA, right [facility])     Past Surgical History:  Procedure Laterality Date   AMPUTATION Right 10/15/2020   Procedure: AMPUTATION ABOVE KNEE;  Surgeon: Newt Minion, MD;  Location: [place];  Service: Orthopedics;  Laterality: Right;   FASCIOTOMY Right  10/11/2020   Procedure: RIGHT LEG FASCIOTOMIES AND DEBRIDE ULCER;  Surgeon: Newt Minion, MD;  Location: [place];  Service: Orthopedics;  Laterality: Right;   I & D EXTREMITY Right 10/13/2020   Procedure: REPEAT DEBRIDEMENT RIGHT LEG;  Surgeon: Newt Minion, MD;  Location: [place];  Service: Orthopedics;  Laterality: Right;    Current Medications: Current Meds  Medication Sig   Ascorbic Acid (VITAMIN C) 500 MG CHEW Chew 1 tablet by mouth daily.   aspirin 81 MG chewable tablet Chew 1 tablet (81 mg total) by mouth daily.   B Complex Vitamins (B-COMPLEX/B-12) TABS Take 1 tablet by  mouth daily.   gabapentin (NEURONTIN) 400 MG capsule Take 400 mg by mouth 2 (two) times daily.   lidocaine (LIDODERM) 5 % Place 1 patch onto the skin as needed for pain.   losartan (COZAAR) 25 MG tablet Take 1 tablet (25 mg total) by mouth daily.     Allergies:   Niacin and related   Social History   Socioeconomic History   Marital status: Married    Spouse name: Not on file   Number of children: Not on file   Years of education: Not on file   Highest education level: Not on file  Occupational History   Not on file  Tobacco Use   Smoking status: Former   Smokeless tobacco: Never  Vaping Use   Vaping Use: Never used  Substance and Sexual Activity   Alcohol use: Not Currently   Drug use: Never   Sexual activity: Yes  Other Topics Concern   Not on file  Social History Narrative   Not on file   Social Determinants of Health   Financial Resource Strain: Not on file  Food Insecurity: Not on file  Transportation Needs: Not on file  Physical Activity: Not on file  Stress: Not on file  Social Connections: Not on file     Family History: The patient's Family history is unknown by patient.  ROS:   Please see the history of present illness.    All other systems reviewed and are negative.  EKGs/Labs/Other Studies Reviewed:    The following studies were reviewed today: I discussed my findings with the patient   Recent Labs: 10/06/2020: TSH 0.741 05/25/2021: ALT 13 05/26/2021: BUN 18; Creatinine, Ser 0.81; Hemoglobin 12.6; Platelets 257; Potassium 4.1; Sodium 136  Recent Lipid Panel    Component Value Date/Time   CHOL 161 04/05/2019 0717   TRIG 139.0 04/05/2019 0717   HDL 38.70 (L) 04/05/2019 0717   CHOLHDL 4 04/05/2019 0717   VLDL 27.8 04/05/2019 0717   LDLCALC 94 04/05/2019 0717    Physical Exam:    VS:  BP (!) 154/72    Pulse 74    Ht 5\' 11"  (1.803 m)    Wt 230 lb 0.6 oz (104.3 kg)    SpO2 98%    BMI 32.08 kg/m     Wt Readings from Last 3 Encounters:   09/11/21 230 lb 0.6 oz (104.3 kg)  05/26/21 215 lb (97.5 kg)  05/25/21 215 lb (97.5 kg)     GEN: Patient is in no acute distress HEENT: Normal NECK: No JVD; No carotid bruits LYMPHATICS: No lymphadenopathy CARDIAC: Hear sounds regular, 2/6 systolic murmur at the apex. RESPIRATORY:  Clear to auscultation without rales, wheezing or rhonchi  ABDOMEN: Soft, non-tender, non-distended MUSCULOSKELETAL:  No edema; No deformity  SKIN: Warm and dry NEUROLOGIC:  Alert and oriented x 3 PSYCHIATRIC:  Normal affect  Signed, Jenean Lindau, MD  09/11/2021 9:32 AM    [place]

## 2021-09-11 NOTE — Patient Instructions (Signed)

## 2021-10-01 ENCOUNTER — Ambulatory Visit (INDEPENDENT_AMBULATORY_CARE_PROVIDER_SITE_OTHER): Payer: BLUE CROSS/BLUE SHIELD | Admitting: Medical

## 2021-10-01 ENCOUNTER — Encounter: Payer: Self-pay | Admitting: Medical

## 2021-10-01 VITALS — BP 130/70 | HR 73 | Temp 98.9°F | Resp 18 | Ht 71.0 in | Wt 226.0 lb

## 2021-10-01 DIAGNOSIS — R739 Hyperglycemia, unspecified: Secondary | ICD-10-CM | POA: Diagnosis not present

## 2021-10-01 DIAGNOSIS — Z125 Encounter for screening for malignant neoplasm of prostate: Secondary | ICD-10-CM | POA: Diagnosis not present

## 2021-10-01 DIAGNOSIS — R972 Elevated prostate specific antigen [PSA]: Secondary | ICD-10-CM | POA: Diagnosis not present

## 2021-10-01 DIAGNOSIS — Z0000 Encounter for general adult medical examination without abnormal findings: Secondary | ICD-10-CM | POA: Diagnosis not present

## 2021-10-01 LAB — CBC WITH DIFFERENTIAL/PLATELET
Basophils Absolute: 0 10*3/uL (ref 0.0–0.1)
Basophils Relative: 0.8 % (ref 0.0–3.0)
Eosinophils Absolute: 0.2 10*3/uL (ref 0.0–0.7)
Eosinophils Relative: 3.2 % (ref 0.0–5.0)
HCT: 40.2 % (ref 39.0–52.0)
Hemoglobin: 13.4 g/dL (ref 13.0–17.0)
Lymphocytes Relative: 33.3 % (ref 12.0–46.0)
Lymphs Abs: 1.9 10*3/uL (ref 0.7–4.0)
MCHC: 33.3 g/dL (ref 30.0–36.0)
MCV: 88.5 fl (ref 78.0–100.0)
Monocytes Absolute: 0.3 10*3/uL (ref 0.1–1.0)
Monocytes Relative: 5.1 % (ref 3.0–12.0)
Neutro Abs: 3.4 10*3/uL (ref 1.4–7.7)
Neutrophils Relative %: 57.6 % (ref 43.0–77.0)
Platelets: 259 10*3/uL (ref 150.0–400.0)
RBC: 4.54 Mil/uL (ref 4.22–5.81)
RDW: 13.8 % (ref 11.5–15.5)
WBC: 5.8 10*3/uL (ref 4.0–10.5)

## 2021-10-01 LAB — COMPREHENSIVE METABOLIC PANEL
ALT: 11 U/L (ref 0–53)
AST: 12 U/L (ref 0–37)
Albumin: 3.9 g/dL (ref 3.5–5.2)
Alkaline Phosphatase: 54 U/L (ref 39–117)
BUN: 14 mg/dL (ref 6–23)
CO2: 24 mEq/L (ref 19–32)
Calcium: 9.3 mg/dL (ref 8.4–10.5)
Chloride: 105 mEq/L (ref 96–112)
Creatinine, Ser: 0.89 mg/dL (ref 0.40–1.50)
GFR: 91.27 mL/min (ref >60.00)
Glucose, Bld: 85 mg/dL (ref 70–99)
Potassium: 4.2 mEq/L (ref 3.5–5.1)
Sodium: 139 mEq/L (ref 135–145)
Total Bilirubin: 0.6 mg/dL (ref 0.2–1.2)
Total Protein: 6.9 g/dL (ref 6.0–8.3)

## 2021-10-01 LAB — LIPID PANEL
Cholesterol: 177 mg/dL (ref 0–200)
HDL: 39.6 mg/dL (ref >39.00)
LDL Cholesterol: 102 mg/dL — ABNORMAL HIGH (ref 0–99)
NonHDL: 137
Total CHOL/HDL Ratio: 4
Triglycerides: 173 mg/dL — ABNORMAL HIGH (ref 0.0–149.0)
VLDL: 34.6 mg/dL (ref 0.0–40.0)

## 2021-10-01 LAB — HEMOGLOBIN A1C: Hgb A1c MFr Bld: 6 % (ref 4.6–6.5)

## 2021-10-01 LAB — PSA: PSA: 5.3 ng/mL — ABNORMAL HIGH (ref 0.10–4.00)

## 2021-10-01 NOTE — Progress Notes (Signed)
Subjective:    Patient ID: Allah Reason, male    DOB: [birth year], 66 y.o.   MRN: [id number]  HPI  Pt in for wellness exam.   Pt is not working since Oct 24, 2020. Pt is seeing pain management. He works as Therapist, occupational. Eating healthy. Nonsmoker. 4 drinks over entire year last year. No caffeine beverages.   Up to date on colonoscopy.    We discussed and update his rt lower extremity wound infection and subsequent amputation.    Assessment & Plan: Visit Diagnoses:  1. 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[place])       Plan: Patient will follow-up with limb bionics to modify the socket to decrease pressure off the end of the residual limb.   Follow-Up Instructions: Return if symptoms worsen or fail to improve.    Ortho Exam   Patient is alert, oriented, no adenopathy, well-dressed, normal affect, normal respiratory effort. Examination the cellulitis has resolved there is no fluid no ulcers there is no tenderness to palpation.   Review of Systems  Constitutional:  Negative for chills, diaphoresis and fever.  HENT:  Negative for congestion.   Respiratory:  Negative for cough, choking, chest tightness, shortness of breath and wheezing.   Cardiovascular:  Negative for chest pain and palpitations.  Gastrointestinal:  Negative for abdominal pain, anal bleeding, blood in stool, constipation, diarrhea and nausea.  Genitourinary:  Negative for dysuria.  Musculoskeletal:  Negative for back pain, joint swelling and neck pain.  Skin:  Negative for rash.  Neurological:  Negative for dizziness, seizures, speech difficulty, weakness and headaches.  Hematological:  Negative for adenopathy. Does not bruise/bleed easily.  Psychiatric/Behavioral:  Negative for behavioral problems, decreased concentration, dysphoric mood, sleep disturbance and suicidal ideas. The patient is not nervous/anxious.     Past Medical History:  Diagnosis Date   Above knee amputation of right  lower extremity ([place]) 10/20/2020   Abscess of right lower leg    Accelerated hypertension 11/08/2018   Acute blood loss anemia    Allergic rhinitis 10/24/2011   Basal cell carcinoma    Basal cell carcinoma of back 07/10/2011   Cellulitis 10/05/2020   Cellulitis of right lower extremity 10/05/2020   Chest pain 11/08/2018   COVID    COVID-19 virus infection 10/05/2020   Drug induced constipation    Encounter to establish care 07/09/2017   Essential hypertension 08/16/2015   Chronic, not well controlled; I advised patient to increase losartan however he wants to work on diet and exercise more first - cont losartan 25mg  daily - check Bps and record - if Bp's continue to trend up patient to go up to 50mg  of losartan - check BMP today - Signs and symptoms of hypertensive crisis reviewed with patient, included but not limited to, worsening sudden onset headache, blurry vi   History of nonmelanoma skin cancer 07/15/2012   Hypertension    Hyponatremia    Impaired fasting glucose 08/23/2014   Chronic, last A1c was 6.0 - will recheck today   Necrotizing fasciitis of lower leg (HCC)    Obesity (BMI 30-39.9) 08/23/2014   Post-operative pain    Prediabetes    Reactive depression    Sensorineural hearing loss (SNHL), bilateral 08/16/2015   Unilateral AKA, right ([place])      Social History   Socioeconomic History   Marital status: Married    Spouse name: Not on file   Number of children: Not on file   Years of education: Not on  file   Highest education level: Not on file  Occupational History   Not on file  Tobacco Use   Smoking status: Former   Smokeless tobacco: Never  Vaping Use   Vaping Use: Never used  Substance and Sexual Activity   Alcohol use: Not Currently   Drug use: Never   Sexual activity: Yes  Other Topics Concern   Not on file  Social History Narrative   Not on file   Social Determinants of Health   Financial Resource Strain: Not on file  Food Insecurity: Not on file   Transportation Needs: Not on file  Physical Activity: Not on file  Stress: Not on file  Social Connections: Not on file  Intimate Partner Violence: Not on file    Past Surgical History:  Procedure Laterality Date   AMPUTATION Right 10/15/2020   Procedure: AMPUTATION ABOVE KNEE;  Surgeon: Newt Minion, MD;  Location: [place];  Service: Orthopedics;  Laterality: Right;   FASCIOTOMY Right 10/11/2020   Procedure: RIGHT LEG FASCIOTOMIES AND DEBRIDE ULCER;  Surgeon: Newt Minion, MD;  Location: [place];  Service: Orthopedics;  Laterality: Right;   I & D EXTREMITY Right 10/13/2020   Procedure: REPEAT DEBRIDEMENT RIGHT LEG;  Surgeon: Newt Minion, MD;  Location: [place];  Service: Orthopedics;  Laterality: Right;    Family History  Family history unknown: Yes    Allergies  Allergen Reactions   Niacin And Related Shortness Of Breath    Current Outpatient Medications on File Prior to Visit  Medication Sig Dispense Refill   Ascorbic Acid (VITAMIN C) 500 MG CHEW Chew 1 tablet by mouth daily.     aspirin 81 MG chewable tablet Chew 1 tablet (81 mg total) by mouth daily.     B Complex Vitamins (B-COMPLEX/B-12) TABS Take 1 tablet by mouth daily.     gabapentin (NEURONTIN) 400 MG capsule Take 400 mg by mouth 2 (two) times daily.     lidocaine (LIDODERM) 5 % Place 1 patch onto the skin as needed for pain.     losartan (COZAAR) 25 MG tablet Take 1 tablet (25 mg total) by mouth daily. 90 tablet 3   No current facility-administered medications on file prior to visit.    BP (!) 150/80    Pulse 73    Temp 98.9 F (37.2 C)    Resp 18    Ht 5\' 11"  (1.803 m)    Wt 226 lb (102.5 kg)    SpO2 96%    BMI 31.52 kg/m       Objective:   Physical Exam  General Mental Status- Alert. General Appearance- Not in acute distress.   Skin General: Color- Normal Color. Moisture- Normal Moisture.  Neck Carotid Arteries- Normal color. Moisture- Normal Moisture. No carotid bruits. No JVD.  Chest and Lung  Exam Auscultation: Breath Sounds:-Normal.  Cardiovascular Auscultation:Rythm- Regular. Murmurs & Other Heart Sounds:Auscultation of the heart reveals- No Murmurs.  Abdomen Inspection:-Inspeection Normal. Palpation/Percussion:Note:No mass. Palpation and Percussion of the abdomen reveal- Non Tender, Non Distended + BS, no rebound or guarding.    Neurologic Cranial Nerve exam:- CN III-XII intact(No nystagmus), symmetric smile. Drift Test:- No drift. Romberg Exam:- Negative.  Heal to Toe Gait exam:-Normal. Finger to Nose:- Normal/Intact Strength:- 5/5 equal and symmetric strength both upper and lower extremities.   Rt lower ext- above the knee amputation.      Assessment & Plan:   Patient Instructions  For you wellness exam today I have ordered cbc,  cmp and lipid panel.  Recommend check with your insurance if they will cover shingles vaccine. If so can call her to get schedule or get done at pharmacy.  Recommend exercise and healthy diet.  We will let you know lab results as they come in.  Follow up date appointment will be determined after lab review.    Will get screening psa today.  Form mild elevated sugar get a1c today.    Mackie Pai, PA-C

## 2021-10-01 NOTE — Patient Instructions (Addendum)
For you wellness exam today I have ordered cbc, cmp and lipid panel.  Recommend check with your insurance if they will cover shingles vaccine. If so can call her to get schedule or get done at pharmacy.  Recommend exercise and healthy diet.  We will let you know lab results as they come in.  Follow up date appointment will be determined after lab review.    Will get screening psa today.  Form mild elevated sugar get a1c today.  Preventive Care 67-7 Years Old, Male Preventive care refers to lifestyle choices and visits with your health care provider that can promote health and wellness. Preventive care visits are also called wellness exams. What can I expect for my preventive care visit? Counseling During your preventive care visit, your health care provider may ask about your: Medical history, including: Past medical problems. Family medical history. Current health, including: Emotional well-being. Home life and relationship well-being. Sexual activity. Lifestyle, including: Alcohol, nicotine or tobacco, and drug use. Access to firearms. Diet, exercise, and sleep habits. Safety issues such as seatbelt and bike helmet use. Sunscreen use. Work and work Statistician. Physical exam Your health care provider will check your: Height and weight. These may be used to calculate your BMI (body mass index). BMI is a measurement that tells if you are at a healthy weight. Waist circumference. This measures the distance around your waistline. This measurement also tells if you are at a healthy weight and may help predict your risk of certain diseases, such as type 2 diabetes and high blood pressure. Heart rate and blood pressure. Body temperature. Skin for abnormal spots. What immunizations do I need? Vaccines are usually given at various ages, according to a schedule. Your health care provider will recommend vaccines for you based on your age, medical history, and lifestyle or other  factors, such as travel or where you work. What tests do I need? Screening Your health care provider may recommend screening tests for certain conditions. This may include: Lipid and cholesterol levels. Diabetes screening. This is done by checking your blood sugar (glucose) after you have not eaten for a while (fasting). Hepatitis B test. Hepatitis C test. HIV (human immunodeficiency virus) test. STI (sexually transmitted infection) testing, if you are at risk. Lung cancer screening. Prostate cancer screening. Colorectal cancer screening. Talk with your health care provider about your test results, treatment options, and if necessary, the need for more tests. Follow these instructions at home: Eating and drinking  Eat a diet that includes fresh fruits and vegetables, whole grains, lean protein, and low-fat dairy products. Take vitamin and mineral supplements as recommended by your health care provider. Do not drink alcohol if your health care provider tells you not to drink. If you drink alcohol: Limit how much you have to 0-2 drinks a day. Know how much alcohol is in your drink. In the [place] one drink equals one 12 oz bottle of beer (355 mL), one 5 oz glass of wine (148 mL), or one 1 oz glass of hard liquor (44 mL). Lifestyle Brush your teeth every morning and night with fluoride toothpaste. Floss one time each day. Exercise for at least 30 minutes 5 or more days each week. Do not use any products that contain nicotine or tobacco. These products include cigarettes, chewing tobacco, and vaping devices, such as e-cigarettes. If you need help quitting, ask your health care provider. Do not use drugs. If you are sexually active, practice safe sex. Use a condom or other form  of protection to prevent STIs. Take aspirin only as told by your health care provider. Make sure that you understand how much to take and what form to take. Work with your health care provider to find out whether it is  safe and beneficial for you to take aspirin daily. Find healthy ways to manage stress, such as: Meditation, yoga, or listening to music. Journaling. Talking to a trusted person. Spending time with friends and family. Minimize exposure to UV radiation to reduce your risk of skin cancer. Safety Always wear your seat belt while driving or riding in a vehicle. Do not drive: If you have been drinking alcohol. Do not ride with someone who has been drinking. When you are tired or distracted. While texting. If you have been using any mind-altering substances or drugs. Wear a helmet and other protective equipment during sports activities. If you have firearms in your house, make sure you follow all gun safety procedures. What's next? Go to your health care provider once a year for an annual wellness visit. Ask your health care provider how often you should have your eyes and teeth checked. Stay up to date on all vaccines. This information is not intended to replace advice given to you by your health care provider. Make sure you discuss any questions you have with your health care provider. Document Revised: 02/14/2021 Document Reviewed: 02/14/2021 Elsevier Patient Education  [place].

## 2021-10-02 ENCOUNTER — Encounter: Payer: Self-pay | Admitting: Medical

## 2021-10-02 NOTE — Addendum Note (Signed)
Addended by: Anabel Halon on: 10/02/2021 09:29 AM   Modules accepted: Orders

## 2021-10-10 ENCOUNTER — Ambulatory Visit (INDEPENDENT_AMBULATORY_CARE_PROVIDER_SITE_OTHER): Payer: BLUE CROSS/BLUE SHIELD

## 2021-10-10 DIAGNOSIS — Z23 Encounter for immunization: Secondary | ICD-10-CM

## 2021-10-10 NOTE — Progress Notes (Signed)
66 yo male here today for first shingles vaccine Pt was given 0.53ml of shingrix in left deltoid. Pt tolerated well Pt is scheduled for 2nd shingles vaccine 02/07/22

## 2021-10-11 ENCOUNTER — Encounter: Payer: Self-pay | Admitting: Medical

## 2021-10-11 MED ORDER — DOXYCYCLINE HYCLATE 100 MG PO TABS: 100.0000 mg | ORAL_TABLET | Freq: Two times a day (BID) | ORAL | 0 refills | Status: DC

## 2021-10-11 NOTE — Addendum Note (Signed)
Addended by: Anabel Halon on: 10/11/2021 03:20 PM   Modules accepted: Orders

## 2021-10-11 NOTE — Telephone Encounter (Signed)
Pt called and appointment has been made with wife.

## 2021-10-12 ENCOUNTER — Ambulatory Visit: Payer: BLUE CROSS/BLUE SHIELD | Admitting: Medical

## 2021-10-12 ENCOUNTER — Encounter: Payer: Self-pay | Admitting: Medical

## 2021-10-12 VITALS — BP 140/80 | HR 77 | Temp 98.2°F | Resp 18 | Ht 71.0 in | Wt 229.0 lb

## 2021-10-12 DIAGNOSIS — L089 Local infection of the skin and subcutaneous tissue, unspecified: Secondary | ICD-10-CM | POA: Diagnosis not present

## 2021-10-12 MED ORDER — CEFTRIAXONE SODIUM 1 G IJ SOLR
1.0000 g | Freq: Once | INTRAMUSCULAR | Status: AC
  Administered 2021-10-12: 1 g via INTRAMUSCULAR

## 2021-10-12 NOTE — Progress Notes (Signed)
Subjective:    Patient ID: Andres [name], male    DOB: [birth year], 66 y.o.   MRN: [id number]  HPI  Pt in for evaluation. He had some red bumps later rt leg and upper thigh. Stump ok per pt.  Left leg- on Monday small bump mid thigh. But lateral distal thigh small red area that has gradually got larger.  He sent me my chart picture. Area looks worse in person 5 cm.  Review of Systems  Constitutional:  Positive for fever.       Subjective fever earlier in the week.  Respiratory:  Negative for cough, chest tightness and wheezing.   Cardiovascular:  Negative for chest pain and palpitations.  Skin:  Positive for rash.       See HPI and exam.    Past Medical History:  Diagnosis Date   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[place]) 10/20/2020   Abscess of right lower leg    Accelerated hypertension 11/08/2018   Acute blood loss anemia    Allergic rhinitis 10/24/2011   Basal cell carcinoma    Basal cell carcinoma of back 07/10/2011   Cellulitis 10/05/2020   Cellulitis of right lower extremity 10/05/2020   Chest pain 11/08/2018   COVID    COVID-19 virus infection 10/05/2020   Drug induced constipation    Encounter to establish care 07/09/2017   Essential hypertension 08/16/2015   Chronic, not well controlled; I advised patient to increase losartan however he wants to work on diet and exercise more first - cont losartan 25mg  daily - check Bps and record - if Bp's continue to trend up patient to go up to 50mg  of losartan - check BMP today - Signs and symptoms of hypertensive crisis reviewed with patient, included but not limited to, worsening sudden onset headache, blurry vi   History of nonmelanoma skin cancer 07/15/2012   Hypertension    Hyponatremia    Impaired fasting glucose 08/23/2014   Chronic, last A1c was 6.0 - will recheck today   Necrotizing fasciitis of lower leg (HCC)    Obesity (BMI 30-39.9) 08/23/2014   Post-operative pain    Prediabetes    Reactive depression     Sensorineural hearing loss (SNHL), bilateral 08/16/2015   Unilateral AKA, right ([place])      Social History   Socioeconomic History   Marital status: Married    Spouse name: Not on file   Number of children: Not on file   Years of education: Not on file   Highest education level: Not on file  Occupational History   Not on file  Tobacco Use   Smoking status: Former   Smokeless tobacco: Never  Vaping Use   Vaping Use: Never used  Substance and Sexual Activity   Alcohol use: Not Currently   Drug use: Never   Sexual activity: Yes  Other Topics Concern   Not on file  Social History Narrative   Not on file   Social Determinants of Health   Financial Resource Strain: Not on file  Food Insecurity: Not on file  Transportation Needs: Not on file  Physical Activity: Not on file  Stress: Not on file  Social Connections: Not on file  Intimate Partner Violence: Not on file    Past Surgical History:  Procedure Laterality Date   AMPUTATION Right 10/15/2020   Procedure: AMPUTATION ABOVE KNEE;  Surgeon: Newt Minion, MD;  Location: [place];  Service: Orthopedics;  Laterality: Right;   FASCIOTOMY Right 10/11/2020  Procedure: RIGHT LEG FASCIOTOMIES AND DEBRIDE ULCER;  Surgeon: Newt Minion, MD;  Location: [place];  Service: Orthopedics;  Laterality: Right;   I & D EXTREMITY Right 10/13/2020   Procedure: REPEAT DEBRIDEMENT RIGHT LEG;  Surgeon: Newt Minion, MD;  Location: [place];  Service: Orthopedics;  Laterality: Right;    Family History  Family history unknown: Yes    Allergies  Allergen Reactions   Niacin And Related Shortness Of Breath    Current Outpatient Medications on File Prior to Visit  Medication Sig Dispense Refill   Ascorbic Acid (VITAMIN C) 500 MG CHEW Chew 1 tablet by mouth daily.     aspirin 81 MG chewable tablet Chew 1 tablet (81 mg total) by mouth daily.     B Complex Vitamins (B-COMPLEX/B-12) TABS Take 1 tablet by mouth daily.     doxycycline (VIBRA-TABS) 100  MG tablet Take 1 tablet (100 mg total) by mouth 2 (two) times daily. 20 tablet 0   gabapentin (NEURONTIN) 400 MG capsule Take 400 mg by mouth 2 (two) times daily.     lidocaine (LIDODERM) 5 % Place 1 patch onto the skin as needed for pain.     losartan (COZAAR) 25 MG tablet Take 1 tablet (25 mg total) by mouth daily. 90 tablet 3   No current facility-administered medications on file prior to visit.    BP 140/80    Pulse 77    Temp 98.2 F (36.8 C)    Resp 18    Ht 5\' 11"  (1.803 m)    Wt 229 lb (103.9 kg)    SpO2 98%    BMI 31.94 kg/m       Objective:   Physical Exam   General- No acute distress. Pleasant patient. Neck- Full range of motion, no jvd Lungs- Clear, even and unlabored. Heart- regular rate and rhythm. Neurologic- CNII- XII grossly intact.   Right leg-small follicular mildly inflamed right upper thigh and lateral mid aspect of thigh.  No induration, warmth or discharge.  Left leg-2 small follicles slightly inflamed upper mid thigh.  However 1 area on distal aspect of lateral thigh scab present with surrounding area of redness about 5 cm in diameter.  The area is indurated.  No fluctuance on palpation.  No discharge noted.  No obvious warmth.  Area is tender.     Assessment & Plan:   Patient Instructions  Left lateral leg region of skin infection/cellulitis.  This area does not appear to have abscess formation presently though that is possible.  The other areas on both legs look like minor folliculitis.  I did try to get adequate culture of the larger area scrubbing central scab.  That will be sent out.  1 g Rocephin given in office.  Start doxycycline oral antibiotic.  If over the weekend the area on left leg/lateral aspect gets worse such as expanding redness, fever, chills or abscess formation then be seen in the emergency department.  IV antibiotics if area worsens may be indicated as well as possible incision and drainage.  We will proceed with caution based on your  history of severe infection on the right side requiring amputation.  Follow-up Tuesday morning with myself or sooner if needed.

## 2021-10-12 NOTE — Patient Instructions (Signed)
Left lateral leg region of skin infection/cellulitis.  This area does not appear to have abscess formation presently though that is possible.  The other areas on both legs look like minor folliculitis.  I did try to get adequate culture of the larger area scrubbing central scab.  That will be sent out.  1 g Rocephin given in office.  Start doxycycline oral antibiotic.  If over the weekend the area on left leg/lateral aspect gets worse such as expanding redness, fever, chills or abscess formation then be seen in the emergency department.  IV antibiotics if area worsens may be indicated as well as possible incision and drainage.  We will proceed with caution based on your history of severe infection on the right side requiring amputation.  Follow-up Tuesday morning with myself or sooner if needed.

## 2021-10-15 LAB — WOUND CULTURE
MICRO NUMBER:: 12993364
SPECIMEN QUALITY:: ADEQUATE

## 2021-10-16 ENCOUNTER — Encounter: Payer: Self-pay | Admitting: Medical

## 2021-10-16 ENCOUNTER — Ambulatory Visit: Payer: BLUE CROSS/BLUE SHIELD | Admitting: Medical

## 2021-10-16 VITALS — BP 132/78 | HR 81 | Resp 18 | Ht 71.0 in | Wt 231.0 lb

## 2021-10-16 DIAGNOSIS — L03116 Cellulitis of left lower limb: Secondary | ICD-10-CM | POA: Diagnosis not present

## 2021-10-16 MED ORDER — CLINDAMYCIN HCL 300 MG PO CAPS: 300.0000 mg | ORAL_CAPSULE | Freq: Three times a day (TID) | ORAL | 0 refills | Status: DC

## 2021-10-16 NOTE — Progress Notes (Signed)
Subjective:    Patient ID: Andres [name], male    DOB: 06-21-1956, 66 y.o.   MRN: [id number]  HPI  Pt in for follow up. Saw pt on Friday. The area of concern have been present for one week.  Hpi in " from last visit. "Left leg- on Monday small bump mid thigh. But lateral distal thigh small red area that has gradually got larger.  He sent me my chart picture. Area looks worse in person 5 cm."  Plan below in " from last visit. "Left lateral leg region of skin infection/cellulitis.  This area does not appear to have abscess formation presently though that is possible.  The other areas on both legs look like minor folliculitis.   I did try to get adequate culture of the larger area scrubbing central scab.  That will be sent out.   1 g Rocephin given in office.  Start doxycycline oral antibiotic."   Pt shows me picture before antibiotic started and area around center looked brighter red.   Culture grew out MRSA.  Pt states area feels better since being on doxycycline.   Review of Systems  Constitutional:  Negative for chills, fatigue and fever.  Respiratory:  Negative for cough, chest tightness, shortness of breath and wheezing.   Cardiovascular:  Negative for chest pain and palpitations.  Gastrointestinal:  Negative for abdominal pain.  Skin:  Positive for rash.       See hpi ad physical exam  Neurological:  Negative for dizziness, numbness and headaches.  Hematological:  Negative for adenopathy. Does not bruise/bleed easily.  Psychiatric/Behavioral:  Negative for confusion.     Past Medical History:  Diagnosis Date   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[place]) 10/20/2020   Abscess of right lower leg    Accelerated hypertension 11/08/2018   Acute blood loss anemia    Allergic rhinitis 10/24/2011   Basal cell carcinoma    Basal cell carcinoma of back 07/10/2011   Cellulitis 10/05/2020   Cellulitis of right lower extremity 10/05/2020   Chest pain 11/08/2018   COVID     COVID-19 virus infection 10/05/2020   Drug induced constipation    Encounter to establish care 07/09/2017   Essential hypertension 08/16/2015   Chronic, not well controlled; I advised patient to increase losartan however he wants to work on diet and exercise more first - cont losartan 25mg  daily - check Bps and record - if Bp's continue to trend up patient to go up to 50mg  of losartan - check BMP today - Signs and symptoms of hypertensive crisis reviewed with patient, included but not limited to, worsening sudden onset headache, blurry vi   History of nonmelanoma skin cancer 07/15/2012   Hypertension    Hyponatremia    Impaired fasting glucose 08/23/2014   Chronic, last A1c was 6.0 - will recheck today   Necrotizing fasciitis of lower leg (HCC)    Obesity (BMI 30-39.9) 08/23/2014   Post-operative pain    Prediabetes    Reactive depression    Sensorineural hearing loss (SNHL), bilateral 08/16/2015   Unilateral AKA, right ([place])      Social History   Socioeconomic History   Marital status: Married    Spouse name: Not on file   Number of children: Not on file   Years of education: Not on file   Highest education level: Not on file  Occupational History   Not on file  Tobacco Use   Smoking status: Former   Smokeless  tobacco: Never  Vaping Use   Vaping Use: Never used  Substance and Sexual Activity   Alcohol use: Not Currently   Drug use: Never   Sexual activity: Yes  Other Topics Concern   Not on file  Social History Narrative   Not on file   Social Determinants of Health   Financial Resource Strain: Not on file  Food Insecurity: Not on file  Transportation Needs: Not on file  Physical Activity: Not on file  Stress: Not on file  Social Connections: Not on file  Intimate Partner Violence: Not on file    Past Surgical History:  Procedure Laterality Date   AMPUTATION Right 10/15/2020   Procedure: AMPUTATION ABOVE KNEE;  Surgeon: Newt Minion, MD;  Location: [facility];   Service: Orthopedics;  Laterality: Right;   FASCIOTOMY Right 10/11/2020   Procedure: RIGHT LEG FASCIOTOMIES AND DEBRIDE ULCER;  Surgeon: Newt Minion, MD;  Location: [place];  Service: Orthopedics;  Laterality: Right;   I & D EXTREMITY Right 10/13/2020   Procedure: REPEAT DEBRIDEMENT RIGHT LEG;  Surgeon: Newt Minion, MD;  Location: [place];  Service: Orthopedics;  Laterality: Right;    Family History  Family history unknown: Yes    Allergies  Allergen Reactions   Niacin And Related Shortness Of Breath    Current Outpatient Medications on File Prior to Visit  Medication Sig Dispense Refill   Ascorbic Acid (VITAMIN C) 500 MG CHEW Chew 1 tablet by mouth daily.     aspirin 81 MG chewable tablet Chew 1 tablet (81 mg total) by mouth daily.     B Complex Vitamins (B-COMPLEX/B-12) TABS Take 1 tablet by mouth daily.     doxycycline (VIBRA-TABS) 100 MG tablet Take 1 tablet (100 mg total) by mouth 2 (two) times daily. 20 tablet 0   gabapentin (NEURONTIN) 400 MG capsule Take 400 mg by mouth 2 (two) times daily.     lidocaine (LIDODERM) 5 % Place 1 patch onto the skin as needed for pain.     losartan (COZAAR) 25 MG tablet Take 1 tablet (25 mg total) by mouth daily. 90 tablet 3   No current facility-administered medications on file prior to visit.    BP (!) 150/70    Pulse 81    Resp 18    Ht 5\' 11"  (1.803 m)    Wt 231 lb (104.8 kg)    SpO2 96%    BMI 32.22 kg/m       Objective:   Physical Exam  General- No acute distress. Pleasant patient. Neck- Full range of motion, no jvd Lungs- Clear, even and unlabored. Heart- regular rate and rhythm. Neurologic- CNII- XII grossly intact.  Skin- left lateral thigh area of former bright redness now much lighter but has hyperpigmented appearance. Hyperpigmented area measures about 3.5-4 cm in diamter. Central very  faint broken down shallow center.        Assessment & Plan:   Patient Instructions  Left lower extremity persisting skin  infection/cellulitis.  Note the area does appear improved but  with your complicated history on the right side do think best to switch you to clindamycin and stop doxycycline.  With clindamycin I think that that is the infection will decrease but side effect is you might get severe diarrhea as we discussed.  Please make sure that you are eating probiotic rich diet and recommend getting probiotics over-the-counter as well.  If you develop any severe loose watery stools let me know and we  will do stool panel testing.  Follow-up this Friday late afternoon.  Let staff know on the way out that I am approving you to have that virtual visit slot at 4 pm.   Mackie Pai, PA-C

## 2021-10-16 NOTE — Patient Instructions (Addendum)
Left lower extremity persisting skin infection/cellulitis.  Note the area does appear improved but  with your complicated history on the right side do think best to switch you to clindamycin and stop doxycycline.  With clindamycin I think that that is the infection will decrease but side effect is you might get severe diarrhea as we discussed.  Please make sure that you are eating probiotic rich diet and recommend getting probiotics over-the-counter as well.  If you develop any severe loose watery stools let me know and we will do stool panel testing.  Follow-up this Friday late afternoon.  Let staff know on the way out that I am approving you to have that virtual visit slot at 4 pm.

## 2021-10-19 ENCOUNTER — Encounter: Payer: Self-pay | Admitting: Medical

## 2021-10-19 ENCOUNTER — Ambulatory Visit: Payer: BLUE CROSS/BLUE SHIELD | Admitting: Medical

## 2021-10-19 VITALS — BP 131/72 | HR 79 | Temp 97.9°F | Ht 71.0 in | Wt 233.8 lb

## 2021-10-19 DIAGNOSIS — L03116 Cellulitis of left lower limb: Secondary | ICD-10-CM

## 2021-10-19 NOTE — Patient Instructions (Addendum)
Skin infection with mrsa. The area on left lower extremity now appear to be healing well with clindamycin. Continue clindamycin and probiotics. If you get any diarrhea let us know.  At this point if you continue to improve just ask you send me picture by my chart next wed morning.   Can return for follow up in person if needed but I think you will continue to do well. On review of picture update might extend antibiotic for more days.

## 2021-10-19 NOTE — Progress Notes (Signed)
° °  Subjective:    Patient ID: Andres [name], male    DOB: 1955/12/27, 66 y.o.   MRN: [id number]  HPI Pt in for follow up. He has been on clindamycin since Tuesday for mrsa. The area looks better and feels better per pt. See last note. No diarrhea. Pt is using probiotics.   No fever or chills.     Review of Systems  Constitutional:  Negative for chills, fatigue and fever.  Respiratory:  Negative for cough, chest tightness and shortness of breath.   Cardiovascular:  Negative for chest pain and palpitations.  Gastrointestinal:  Negative for abdominal pain.  Musculoskeletal:  Negative for back pain.  Skin:        See hpi and exam.      Objective:   Physical Exam  General- No acute distress. Pleasant patient. Neck- Full range of motion, no jvd Lungs- Clear, even and unlabored. Heart- regular rate and rhythm. Neurologic- CNII- XII grossly intact.  Skin- left lateral thigh area of former bright redness now appearing more normal in color. Skin appears drier. Scab flaked off. Area is softer/not indurated.  Area mid upper thigh also looks better.      Assessment & Plan:   Patient Instructions  Skin infection with mrsa. The area on left lower extremity now appear to be healing well with clindamycin. Continue clindamycin and probiotics. If you get any diarrhea let us know.  At this point if you continue to improve just ask you send me picture by my chart next wed morning.   Can return for follow up in person if needed but I think you will continue to do well. On review of picture update might extend antibiotic for more days.     Mackie Pai, PA-C

## 2021-10-22 ENCOUNTER — Ambulatory Visit: Payer: BLUE CROSS/BLUE SHIELD | Admitting: Medical

## 2022-02-07 ENCOUNTER — Ambulatory Visit (INDEPENDENT_AMBULATORY_CARE_PROVIDER_SITE_OTHER): Payer: BLUE CROSS/BLUE SHIELD

## 2022-02-07 DIAGNOSIS — Z23 Encounter for immunization: Secondary | ICD-10-CM | POA: Diagnosis not present

## 2022-02-07 NOTE — Progress Notes (Addendum)
66 yo male here today for first shingles vaccine Pt was given 0.19m of shingrix in left deltoid. Pt tolerated well   EMackie Pai PA-C

## 2022-02-19 ENCOUNTER — Other Ambulatory Visit: Payer: Self-pay | Admitting: Cardiology

## 2022-04-18 ENCOUNTER — Ambulatory Visit: Payer: Self-pay | Admitting: Licensed Clinical Social Worker

## 2022-04-18 NOTE — Patient Instructions (Signed)
Visit Information  Thank you for taking time to visit with me today. Please don't hesitate to contact me if I can be of assistance to you before our next scheduled telephone appointment.  Following are the goals we discussed today:   No further intervention is needed at present  Please call the care guide team at [phone] if you need to cancel or reschedule your appointment.   If you are experiencing a Mental Health or [place] or need someone to talk to, please go to [facility] [phone])   Following is a copy of your full plan of care:   Care Coordination Interventions:  Solution-Focused Strategies employed:  Active listening / Reflection utilized  Described Care Coordination program support to Santo Held, spouse of client Reviewed current client needs. He has [employer]. He has case manager monitoring his care needs. He has Designer, jewellery. He has support of his wife. Marliss Coots wrote down name and phone number of LCSW and wrote down name of program support. She appreciated call and will keep information to use if needed in the future for client   Mr. Cornwall was given information about Care Management services by the embedded care coordination team including:  Care Management services include personalized support from designated clinical staff supervised by his physician, including individualized plan of care and coordination with other care providers 24/7 contact phone numbers for assistance for urgent and routine care needs. The patient may stop CCM services at any time (effective at the end of the month) by phone call to the office staff.  Patient agreed to services and verbal consent obtained.   Norva Riffle.Josia Cueva MSW, [place] representative [facility] Care Management [phone]

## 2022-04-18 NOTE — Patient Outreach (Signed)
  Care Coordination   Initial Visit Note   04/18/2022 Name: Andres [name] MRN: [id number] DOB: Jun 11, 1956  Andres [name] is a 66 y.o. year old male who sees [place], Percell Miller, [place] for primary care. I spoke with  [name] / spouse of client, Seraphim Trow, by phone today  What matters to the patients health and wellness today?  Client is stable. He has case manager to monitor his care needs. He has Worker's Conservator, museum/gallery.    Goals Addressed             This Visit's Progress    patient is stable at present. He has case manager who is monitoring his medical needs. He has [place].       Care Coordination Interventions:  Solution-Focused Strategies employed:  Active listening / Reflection utilized  Described Care Coordination program support to Santo Held, spouse of client Reviewed current client needs. He has AKA. He has case manager monitoring his care needs. He has Designer, jewellery. He has support of his wife. Marliss Coots wrote down name and phone number of LCSW and wrote down name of program support. She appreciated call and will keep information to use if needed in the future for client        SDOH assessments and interventions completed:  No    Care Coordination Interventions Activated:  No  Care Coordination Interventions:  No, not indicated   Follow up plan: No further intervention required.   Encounter Outcome:  Pt. Visit Completed

## 2022-06-10 DIAGNOSIS — L570 Actinic keratosis: Secondary | ICD-10-CM | POA: Diagnosis not present

## 2022-06-10 DIAGNOSIS — L821 Other seborrheic keratosis: Secondary | ICD-10-CM | POA: Diagnosis not present

## 2022-06-10 DIAGNOSIS — D485 Neoplasm of uncertain behavior of skin: Secondary | ICD-10-CM | POA: Diagnosis not present

## 2022-06-10 DIAGNOSIS — C44519 Basal cell carcinoma of skin of other part of trunk: Secondary | ICD-10-CM | POA: Diagnosis not present

## 2022-06-10 DIAGNOSIS — X32XXXS Exposure to sunlight, sequela: Secondary | ICD-10-CM | POA: Diagnosis not present

## 2022-06-10 DIAGNOSIS — D1801 Hemangioma of skin and subcutaneous tissue: Secondary | ICD-10-CM | POA: Diagnosis not present

## 2022-06-10 DIAGNOSIS — L814 Other melanin hyperpigmentation: Secondary | ICD-10-CM | POA: Diagnosis not present

## 2022-07-02 DIAGNOSIS — C44519 Basal cell carcinoma of skin of other part of trunk: Secondary | ICD-10-CM | POA: Diagnosis not present

## 2022-08-04 IMAGING — DX DG CHEST 1V PORT
1 series · 1 of 1 positions shown · non-contrast
Comparison: 11/08/2018

CLINICAL DATA: COVID positive.  Sepsis.

EXAM:
PORTABLE CHEST 1 VIEW

[chest ap]
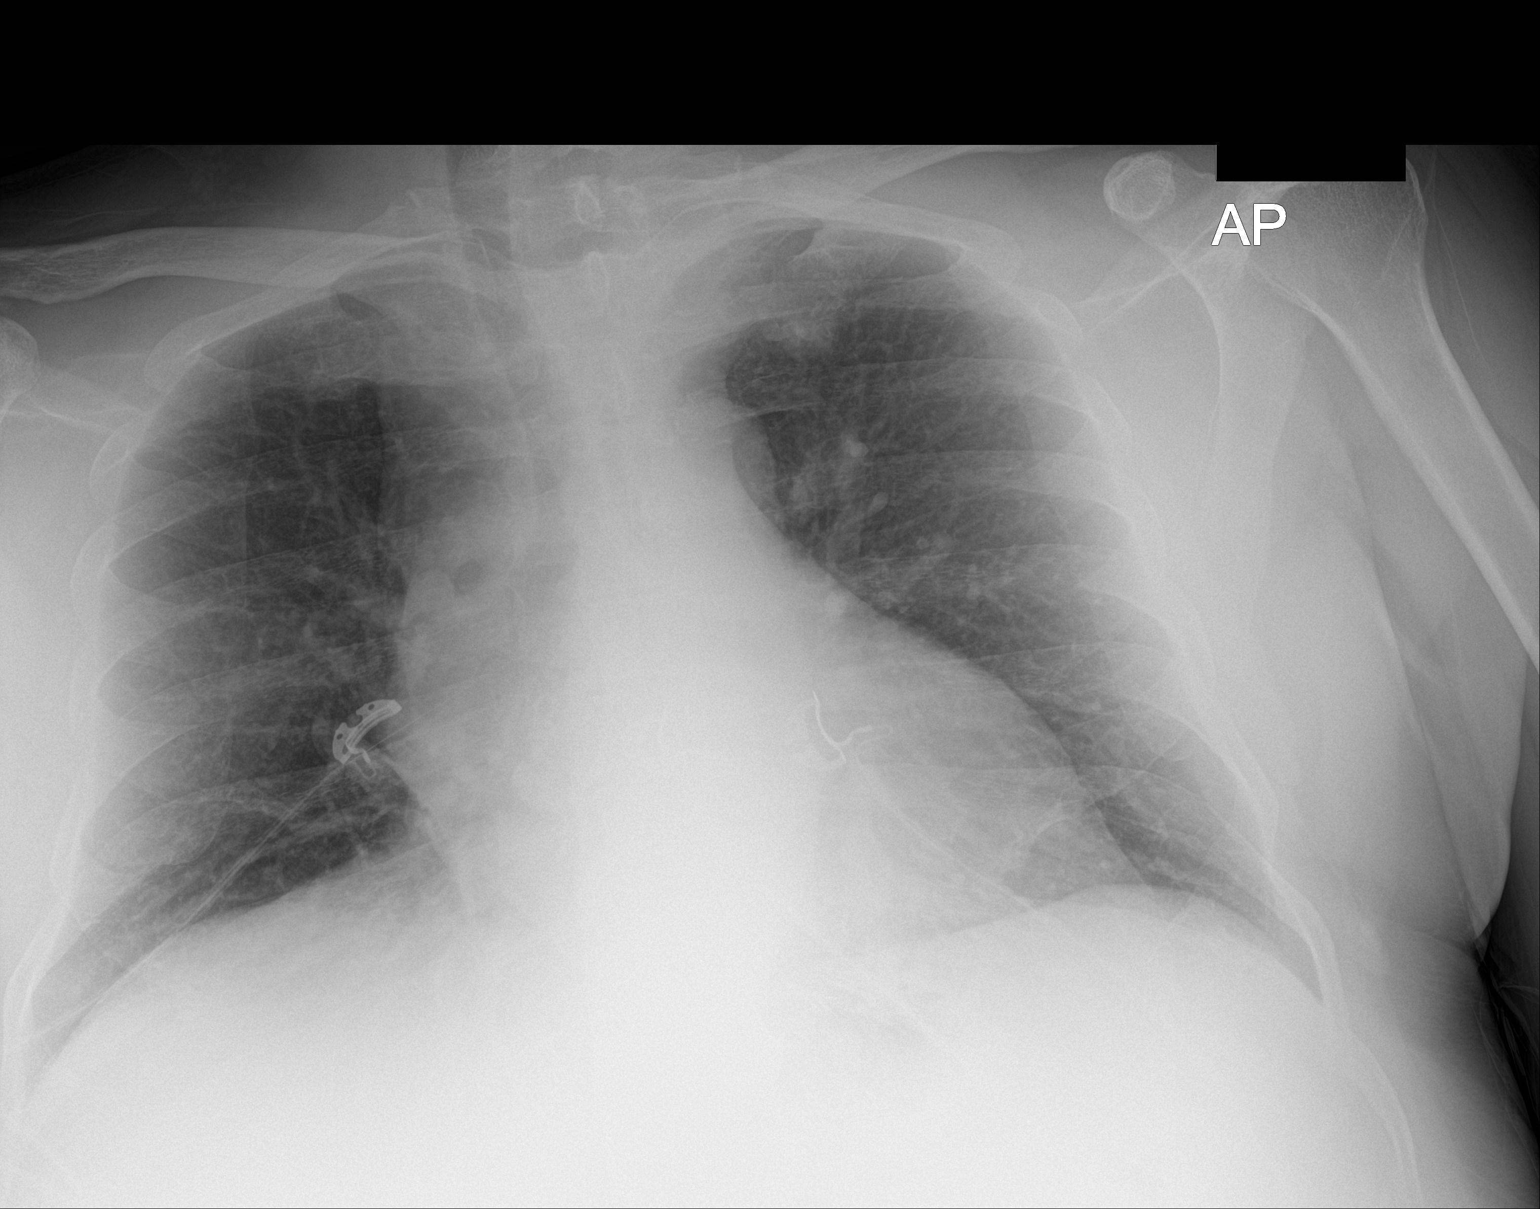

[1 of 1 positions shown; findings below may reference images not displayed]

FINDINGS: Cardiac enlargement without heart failure. Lungs are well aerated
and clear. No infiltrate or effusion. Trachea deviated to the right
due to goiter, unchanged.
IMPRESSION: No active disease.

## 2022-08-04 IMAGING — DX DG TIBIA/FIBULA 2V*R*
3 series · 3 of 3 positions shown · non-contrast
Comparison: None.

CLINICAL DATA: Wound.  Cellulitis possible sepsis

EXAM:
RIGHT TIBIA AND FIBULA - 2 VIEW

[tibia ap]
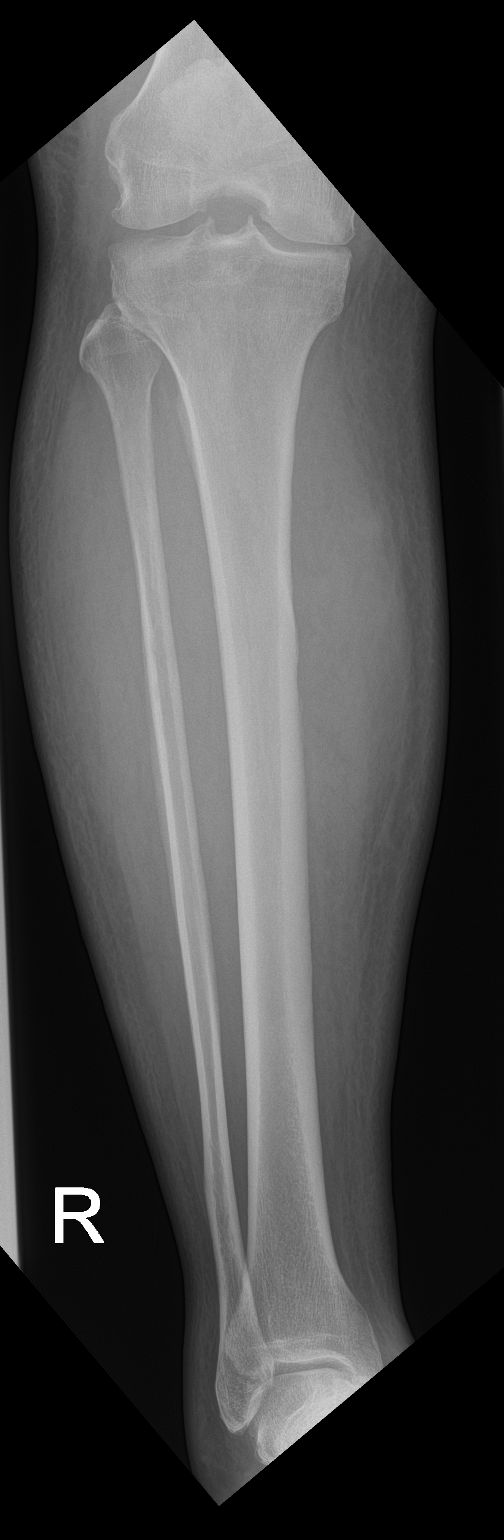

[tibia lat (1 of 2)]
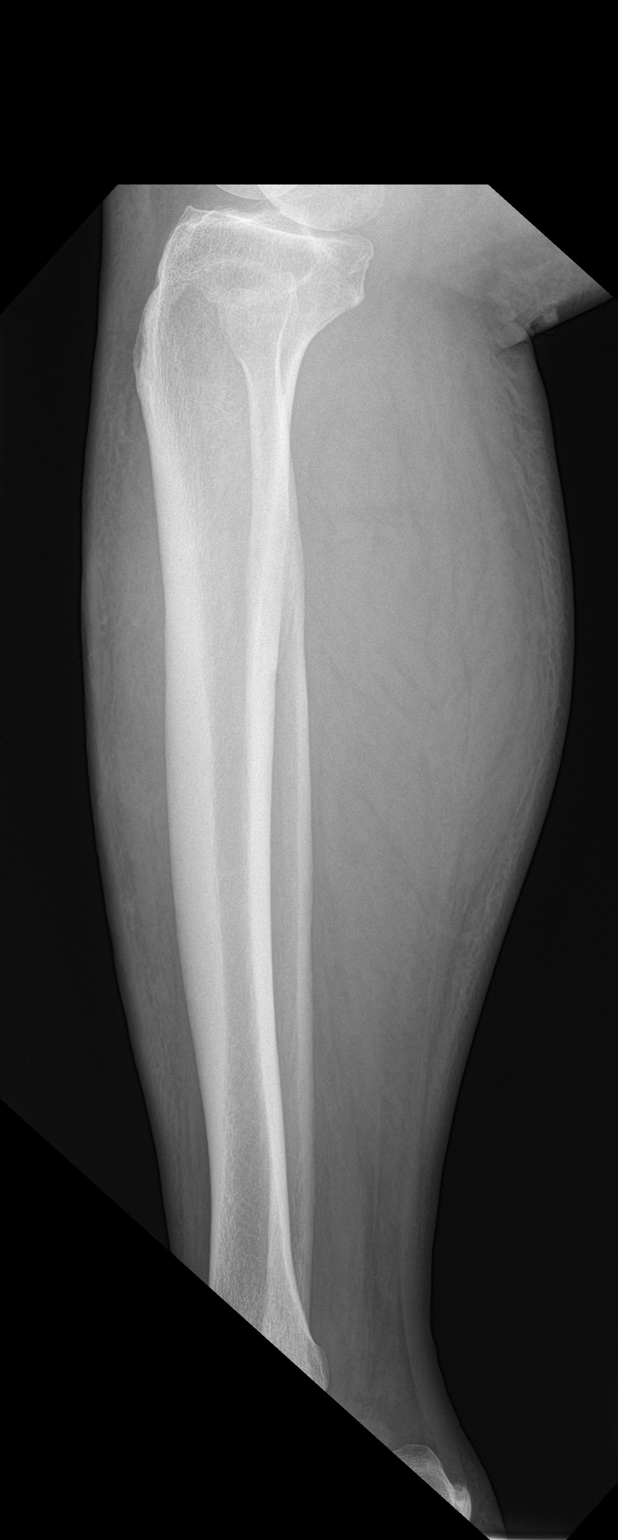

[tibia lat (2 of 2)]
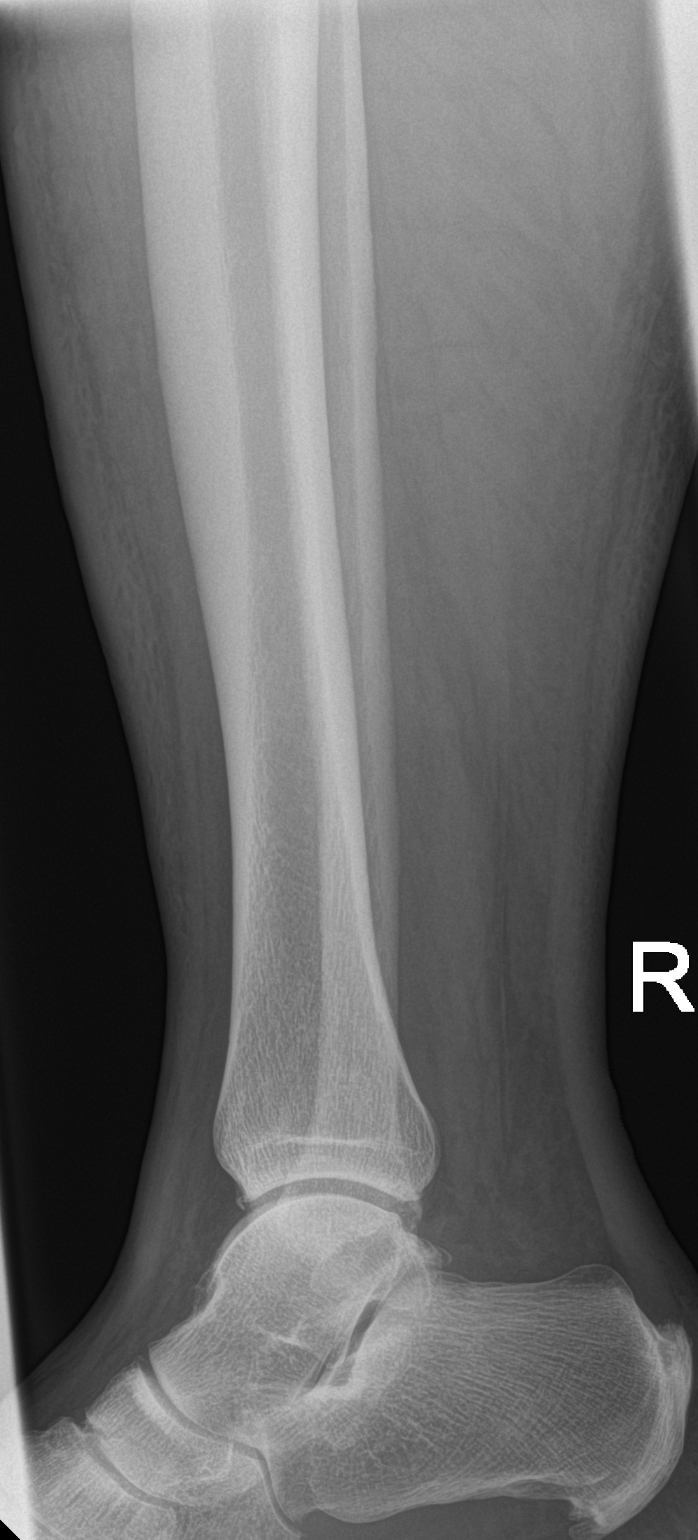

[3 of 3 positions shown; findings below may reference images not displayed]

FINDINGS: There is no evidence of fracture or other focal bone lesions. Soft
tissues are unremarkable.
IMPRESSION: Negative.

## 2022-08-04 IMAGING — US US EXTREM LOW VENOUS*R*
1 series · 13 of 24 positions shown · non-contrast
Comparison: None.

CLINICAL DATA: Edema, pain and erythema right lower extremity.



[Series 1: us extrem low venous*right* · 13 of 40 slices shown]
[im 1/40]
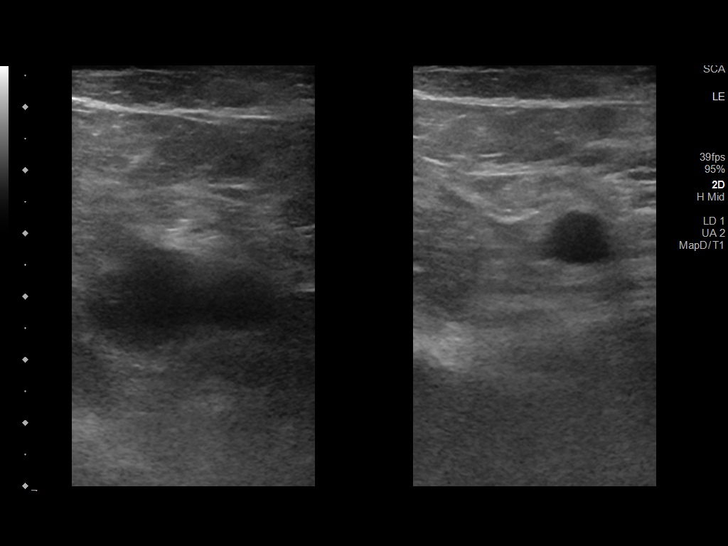
[im 4/40]
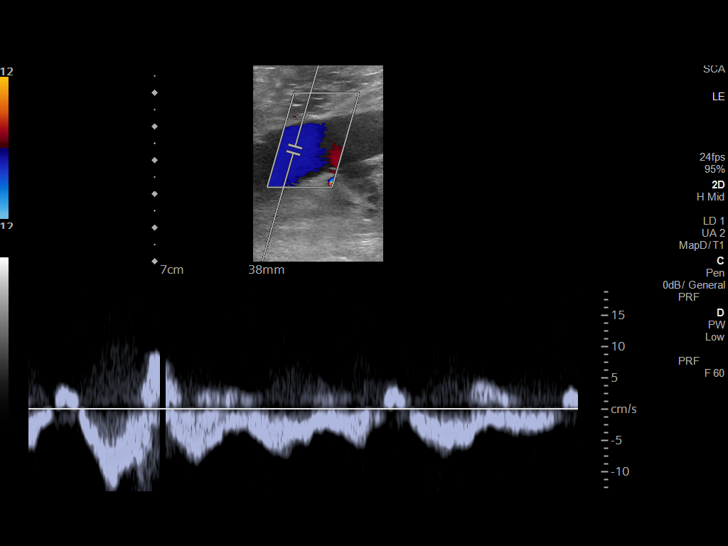
[im 7/40]
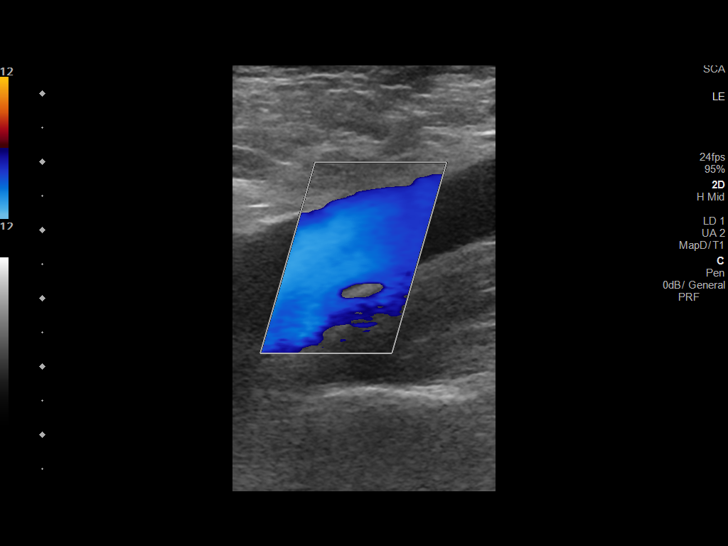
[im 11/40]
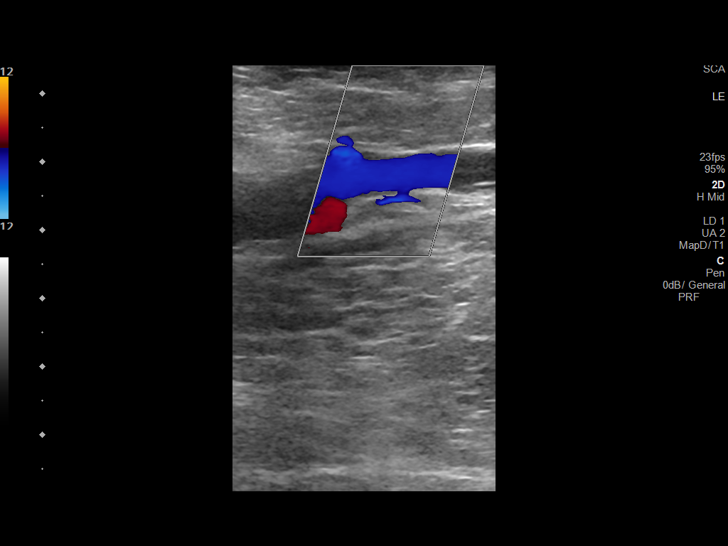
[im 14/40]
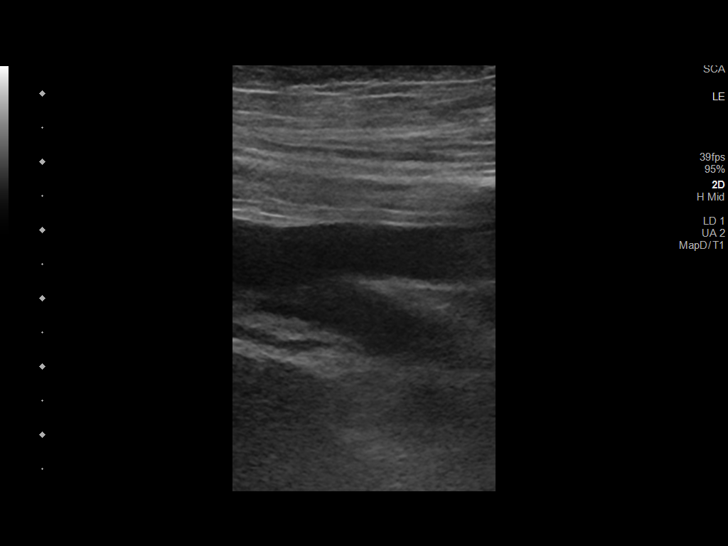
[im 17/40]
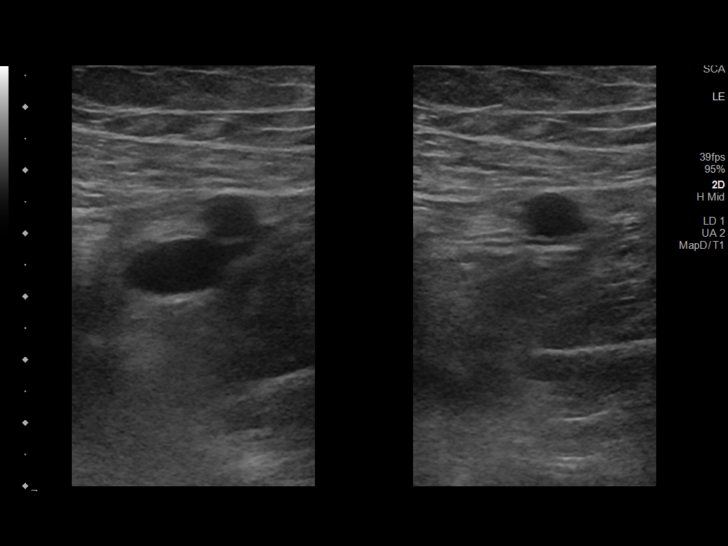
[im 21/40]
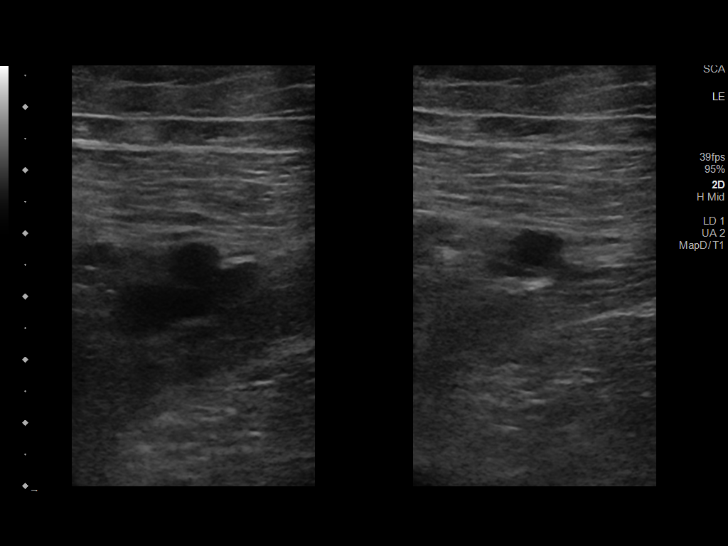
[im 23/40]
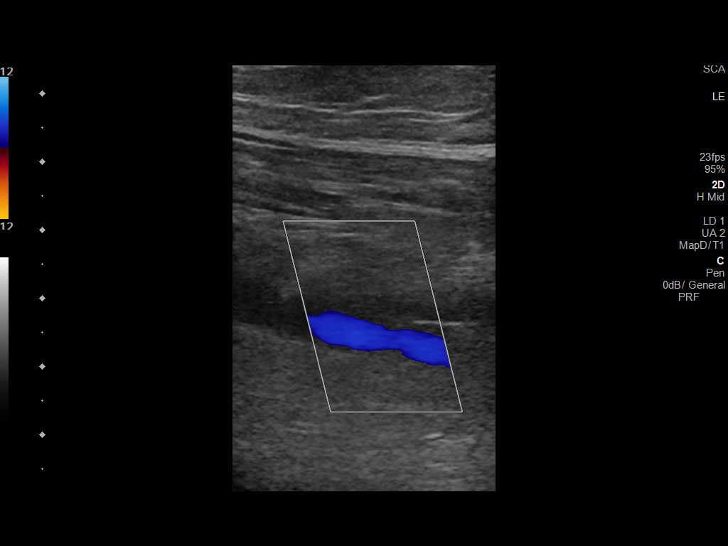
[im 26/40]
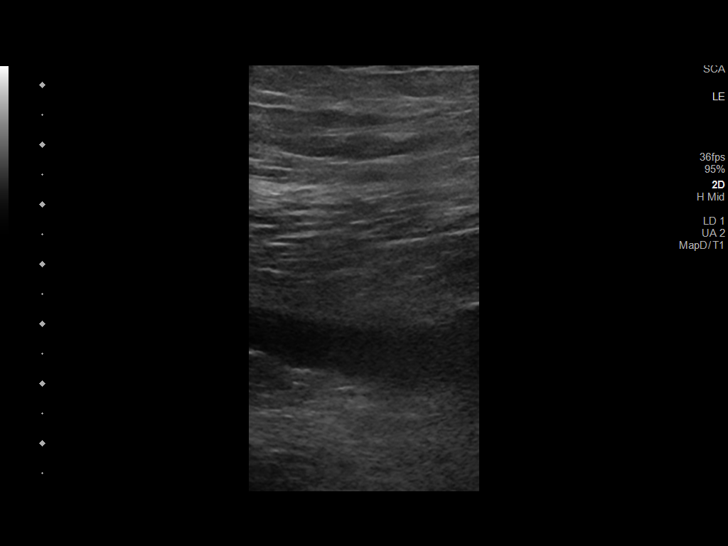
[im 29/40]
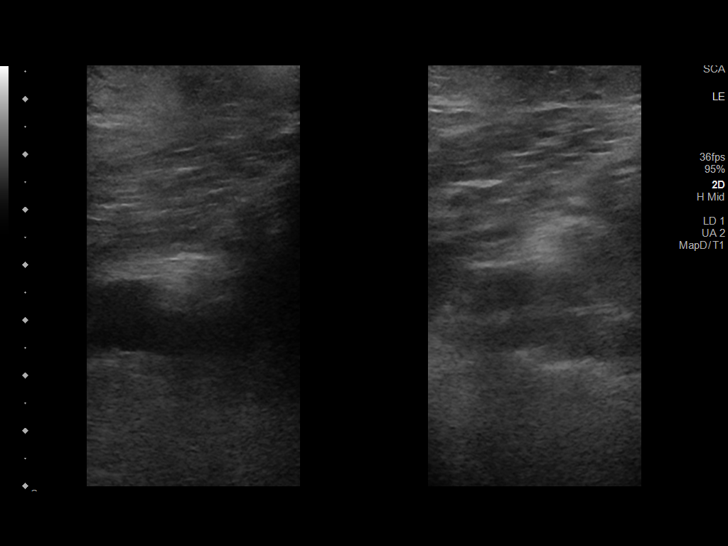
[im 33/40]
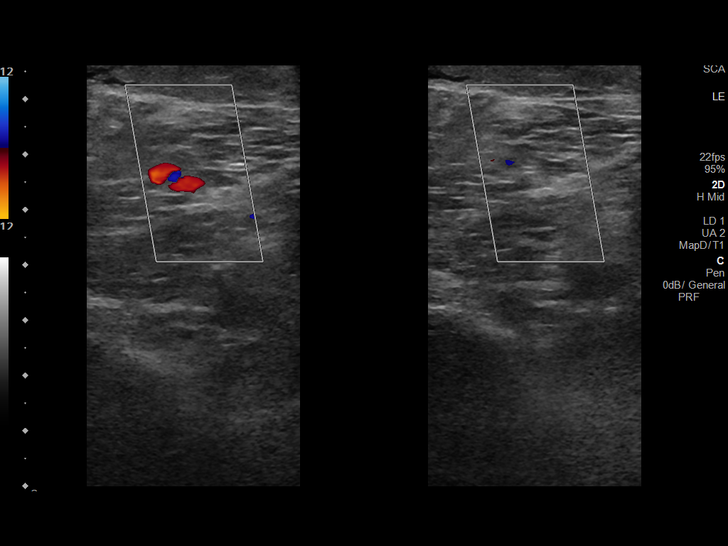
[im 36/40]
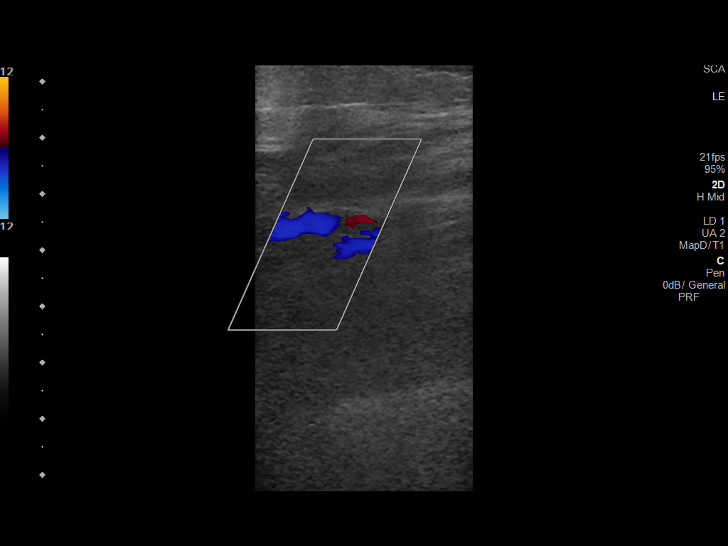
[im 40/40]
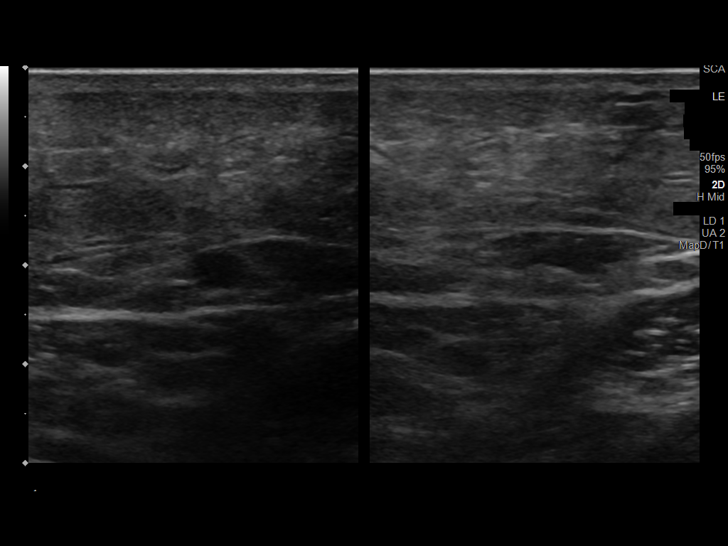

[13 of 24 positions shown; findings below may reference images not displayed]

FINDINGS: Contralateral Common Femoral Vein: Respiratory phasicity is normal
and symmetric with the symptomatic side. No evidence of thrombus.
Normal compressibility.

Common Femoral Vein: No evidence of thrombus. Normal
compressibility, respiratory phasicity and response to augmentation.

Saphenofemoral Junction: No evidence of thrombus. Normal
compressibility and flow on color Doppler imaging.

Profunda Femoral Vein: No evidence of thrombus. Normal
compressibility and flow on color Doppler imaging.

Femoral Vein: No evidence of thrombus. Normal compressibility,
respiratory phasicity and response to augmentation.

Popliteal Vein: No evidence of thrombus. Normal compressibility,
respiratory phasicity and response to augmentation.

Calf Veins: No evidence of thrombus. Normal compressibility and flow
on color Doppler imaging.

Superficial Great Saphenous Vein: No evidence of thrombus. Normal
compressibility.

Venous Reflux:  None.

Other Findings: No evidence of superficial thrombophlebitis or
abnormal fluid collection.
IMPRESSION: No evidence of right lower extremity deep venous thrombosis.

## 2022-10-02 ENCOUNTER — Encounter: Payer: BLUE CROSS/BLUE SHIELD | Admitting: Medical

## 2022-10-09 ENCOUNTER — Ambulatory Visit (INDEPENDENT_AMBULATORY_CARE_PROVIDER_SITE_OTHER): Payer: BLUE CROSS/BLUE SHIELD | Admitting: Medical

## 2022-10-09 ENCOUNTER — Encounter: Payer: Self-pay | Admitting: Medical

## 2022-10-09 VITALS — BP 140/75 | HR 70 | Temp 98.0°F | Resp 18 | Ht 71.0 in | Wt 247.0 lb

## 2022-10-09 DIAGNOSIS — E785 Hyperlipidemia, unspecified: Secondary | ICD-10-CM

## 2022-10-09 DIAGNOSIS — I10 Essential (primary) hypertension: Secondary | ICD-10-CM

## 2022-10-09 DIAGNOSIS — E041 Nontoxic single thyroid nodule: Secondary | ICD-10-CM

## 2022-10-09 DIAGNOSIS — R972 Elevated prostate specific antigen [PSA]: Secondary | ICD-10-CM | POA: Diagnosis not present

## 2022-10-09 DIAGNOSIS — Z0001 Encounter for general adult medical examination with abnormal findings: Secondary | ICD-10-CM | POA: Diagnosis not present

## 2022-10-09 DIAGNOSIS — R739 Hyperglycemia, unspecified: Secondary | ICD-10-CM

## 2022-10-09 DIAGNOSIS — E010 Iodine-deficiency related diffuse (endemic) goiter: Secondary | ICD-10-CM

## 2022-10-09 DIAGNOSIS — Z89611 Acquired absence of right leg above knee: Secondary | ICD-10-CM

## 2022-10-09 DIAGNOSIS — Z0000 Encounter for general adult medical examination without abnormal findings: Secondary | ICD-10-CM

## 2022-10-09 LAB — LIPID PANEL
Cholesterol: 163 mg/dL (ref 0–200)
HDL: 37.4 mg/dL — ABNORMAL LOW (ref >39.00)
NonHDL: 126
Total CHOL/HDL Ratio: 4
Triglycerides: 238 mg/dL — ABNORMAL HIGH (ref 0.0–149.0)
VLDL: 47.6 mg/dL — ABNORMAL HIGH (ref 0.0–40.0)

## 2022-10-09 LAB — CBC WITH DIFFERENTIAL/PLATELET
Basophils Absolute: 0.1 10*3/uL (ref 0.0–0.1)
Basophils Relative: 0.8 % (ref 0.0–3.0)
Eosinophils Absolute: 0.4 10*3/uL (ref 0.0–0.7)
Eosinophils Relative: 5.5 % — ABNORMAL HIGH (ref 0.0–5.0)
HCT: 40.4 % (ref 39.0–52.0)
Hemoglobin: 13.8 g/dL (ref 13.0–17.0)
Lymphocytes Relative: 37 % (ref 12.0–46.0)
Lymphs Abs: 2.4 10*3/uL (ref 0.7–4.0)
MCHC: 34.3 g/dL (ref 30.0–36.0)
MCV: 88.2 fl (ref 78.0–100.0)
Monocytes Absolute: 0.4 10*3/uL (ref 0.1–1.0)
Monocytes Relative: 6.6 % (ref 3.0–12.0)
Neutro Abs: 3.2 10*3/uL (ref 1.4–7.7)
Neutrophils Relative %: 50.1 % (ref 43.0–77.0)
Platelets: 292 10*3/uL (ref 150.0–400.0)
RBC: 4.58 Mil/uL (ref 4.22–5.81)
RDW: 14.1 % (ref 11.5–15.5)
WBC: 6.4 10*3/uL (ref 4.0–10.5)

## 2022-10-09 LAB — COMPREHENSIVE METABOLIC PANEL
ALT: 28 U/L (ref 0–53)
AST: 16 U/L (ref 0–37)
Albumin: 3.9 g/dL (ref 3.5–5.2)
Alkaline Phosphatase: 49 U/L (ref 39–117)
BUN: 12 mg/dL (ref 6–23)
CO2: 24 mEq/L (ref 19–32)
Calcium: 8.9 mg/dL (ref 8.4–10.5)
Chloride: 106 mEq/L (ref 96–112)
Creatinine, Ser: 0.87 mg/dL (ref 0.40–1.50)
GFR: 91.25 mL/min (ref >60.00)
Glucose, Bld: 97 mg/dL (ref 70–99)
Potassium: 4.6 mEq/L (ref 3.5–5.1)
Sodium: 142 mEq/L (ref 135–145)
Total Bilirubin: 0.5 mg/dL (ref 0.2–1.2)
Total Protein: 6.9 g/dL (ref 6.0–8.3)

## 2022-10-09 LAB — HEMOGLOBIN A1C: Hgb A1c MFr Bld: 6.5 % (ref 4.6–6.5)

## 2022-10-09 LAB — PSA: PSA: 4.28 ng/mL — ABNORMAL HIGH (ref 0.10–4.00)

## 2022-10-09 LAB — LDL CHOLESTEROL, DIRECT: Direct LDL: 91 mg/dL

## 2022-10-09 NOTE — Patient Instructions (Addendum)
For you wellness exam today I have ordered cbc, cmp and  lipid panel.  For elevated psa in past recheck today  Check A1c for mild sugar elevation in past.  Vaccine appear up to date.  If you decide on covid vaccine then get yearly.  Recommend exercise and healthy diet.  We will let you know lab results as they come in.  Follow up date appointment will be determined after lab review.    Htn- bp is borderline on recheck. Better bp at home. If bp checks at home trending upward toward 341 systolic then will need to increase losartan to 50 mg daily.  High cholesterol- if still high would recommend zetia since you express reluctance on statin.   If psa elevated again would want you to get back in with urologist.   Preventive Care 40-83 Years Old, Male Preventive care refers to lifestyle choices and visits with your health care provider that can promote health and wellness. Preventive care visits are also called wellness exams. What can I expect for my preventive care visit? Counseling During your preventive care visit, your health care provider may ask about your: Medical history, including: Past medical problems. Family medical history. Current health, including: Emotional well-being. Home life and relationship well-being. Sexual activity. Lifestyle, including: Alcohol, nicotine or tobacco, and drug use. Access to firearms. Diet, exercise, and sleep habits. Safety issues such as seatbelt and bike helmet use. Sunscreen use. Work and work Statistician. Physical exam Your health care provider will check your: Height and weight. These may be used to calculate your BMI (body mass index). BMI is a measurement that tells if you are at a healthy weight. Waist circumference. This measures the distance around your waistline. This measurement also tells if you are at a healthy weight and may help predict your risk of certain diseases, such as type 2 diabetes and high blood pressure. Heart  rate and blood pressure. Body temperature. Skin for abnormal spots. What immunizations do I need?  Vaccines are usually given at various ages, according to a schedule. Your health care provider will recommend vaccines for you based on your age, medical history, and lifestyle or other factors, such as travel or where you work. What tests do I need? Screening Your health care provider may recommend screening tests for certain conditions. This may include: Lipid and cholesterol levels. Diabetes screening. This is done by checking your blood sugar (glucose) after you have not eaten for a while (fasting). Hepatitis B test. Hepatitis C test. HIV (human immunodeficiency virus) test. STI (sexually transmitted infection) testing, if you are at risk. Lung cancer screening. Prostate cancer screening. Colorectal cancer screening. Talk with your health care provider about your test results, treatment options, and if necessary, the need for more tests. Follow these instructions at home: Eating and drinking  Eat a diet that includes fresh fruits and vegetables, whole grains, lean protein, and low-fat dairy products. Take vitamin and mineral supplements as recommended by your health care provider. Do not drink alcohol if your health care provider tells you not to drink. If you drink alcohol: Limit how much you have to 0-2 drinks a day. Know how much alcohol is in your drink. In the [place] one drink equals one 12 oz bottle of beer (355 mL), one 5 oz glass of wine (148 mL), or one 1 oz glass of hard liquor (44 mL). Lifestyle Brush your teeth every morning and night with fluoride toothpaste. Floss one time each day. Exercise for at least  30 minutes 5 or more days each week. Do not use any products that contain nicotine or tobacco. These products include cigarettes, chewing tobacco, and vaping devices, such as e-cigarettes. If you need help quitting, ask your health care provider. Do not use drugs. If  you are sexually active, practice safe sex. Use a condom or other form of protection to prevent STIs. Take aspirin only as told by your health care provider. Make sure that you understand how much to take and what form to take. Work with your health care provider to find out whether it is safe and beneficial for you to take aspirin daily. Find healthy ways to manage stress, such as: Meditation, yoga, or listening to music. Journaling. Talking to a trusted person. Spending time with friends and family. Minimize exposure to UV radiation to reduce your risk of skin cancer. Safety Always wear your seat belt while driving or riding in a vehicle. Do not drive: If you have been drinking alcohol. Do not ride with someone who has been drinking. When you are tired or distracted. While texting. If you have been using any mind-altering substances or drugs. Wear a helmet and other protective equipment during sports activities. If you have firearms in your house, make sure you follow all gun safety procedures. What's next? Go to your health care provider once a year for an annual wellness visit. Ask your health care provider how often you should have your eyes and teeth checked. Stay up to date on all vaccines. This information is not intended to replace advice given to you by your health care provider. Make sure you discuss any questions you have with your health care provider. Document Revised: 02/14/2021 Document Reviewed: 02/14/2021 Elsevier Patient Education  [place].

## 2022-10-09 NOTE — Addendum Note (Signed)
Addended by: Anabel Halon on: 10/09/2022 10:34 AM   Modules accepted: Orders

## 2022-10-09 NOTE — Progress Notes (Signed)
   Subjective:    Patient ID: Andres [name], male    DOB: [birth year], 67 y.o.   MRN: [id number]  HPI  Pt in for wellness exam.    Pt is not working since Oct 24, 2020. He wants to work.  Pt is seeing pain management. He works as Therapist, occupational. Eating healthy. Nonsmoker. 4-8 drinks over entire year last year. No caffeine beverages.     Up to date on colonoscopy.     Pt has htn. Bp initially high. He did take losartan 25 mg daily tab. Pt states at home is bp 130-140. Often 409 systolic.  Prior psa mild high. He states he did go to urologist and he can't remember what they did. But did not get impression any severe diagnosis.    Mild elevated cholesterol last year.  The 10-year ASCVD risk score (Arnett DK, et al., 2019) is: 16.7%   Values used to calculate the score:     Age: 26 years     Sex: Male     Is Non-Hispanic African American: No     Diabetic: No     Tobacco smoker: No     Systolic Blood Pressure: 811 mmHg     Is BP treated: Yes     HDL Cholesterol: 39.6 mg/dL     Total Cholesterol: 177 mg/dL     We discussed and update his rt lower extremity wound infection and subsequent amputation.    1. 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[place])      he upsates me that prior infection now cleared.       Objective:   Physical Exam   General Mental Status- Alert. General Appearance- Not in acute distress.   Skin General: Color- Normal Color. Moisture- Normal Moisture.  Neck On exam. Some asymmetry to thyroid. Left side.  Chest and Lung Exam Auscultation: Breath Sounds:-Normal.  Cardiovascular Auscultation:Rythm- Regular. Murmurs & Other Heart Sounds:Auscultation of the heart reveals- No Murmurs.  Abdomen Inspection:-Inspeection Normal. Palpation/Percussion:Note:No mass. Palpation and Percussion of the abdomen reveal- Non Tender, Non Distended + BS, no rebound or guarding.  Neurologic Cranial Nerve exam:- CN III-XII intact(No nystagmus),  symmetric smile. Strength:- 5/5 equal and symmetric strength both upper and lower extremities.      Assessment & Plan:   Patient Instructions  For you wellness exam today I have ordered cbc, cmp and  lipid panel.  For elevated psa in past recheck today  Check A1c for mild sugar elevation in past.  Vaccine appear up to date.  If you decide on covid vaccine then get yearly.  Recommend exercise and healthy diet.  We will let you know lab results as they come in.  Follow up date appointment will be determined after lab review.    Htn- bp is borderline on recheck. Better bp at home. If bp checks at home trending upward toward 914 systolic then will need to increase losartan to 50 mg daily.  High cholesterol- if still high would recommend zetia since you express reluctance on statin.   If psa elevated again would want you to get back in with urologist.        Mackie Pai, PA-C   [phone] charge as did address htn, high cholesterol plan, elevated psa and order thyroid US for asymmetric large thyroid.

## 2022-10-10 ENCOUNTER — Ambulatory Visit (HOSPITAL_BASED_OUTPATIENT_CLINIC_OR_DEPARTMENT_OTHER)
Admission: RE | Admit: 2022-10-10 | Discharge: 2022-10-10 | Disposition: A | Discharge status: Home / Self Care | Payer: BLUE CROSS/BLUE SHIELD | Source: Ambulatory Visit | Attending: Medical | Admitting: Medical

## 2022-10-10 DIAGNOSIS — E010 Iodine-deficiency related diffuse (endemic) goiter: Secondary | ICD-10-CM | POA: Diagnosis not present

## 2022-10-10 DIAGNOSIS — E041 Nontoxic single thyroid nodule: Secondary | ICD-10-CM | POA: Diagnosis not present

## 2022-10-10 MED ORDER — EZETIMIBE 10 MG PO TABS: 10.0000 mg | ORAL_TABLET | Freq: Every day | ORAL | 3 refills | Status: DC

## 2022-10-10 NOTE — Addendum Note (Signed)
Addended by: Anabel Halon on: 10/10/2022 12:48 PM   Modules accepted: Orders

## 2022-10-10 NOTE — Addendum Note (Signed)
Addended by: Anabel Halon on: 10/10/2022 12:42 PM   Modules accepted: Orders

## 2022-10-11 ENCOUNTER — Encounter: Payer: Self-pay | Admitting: Medical

## 2022-10-11 ENCOUNTER — Other Ambulatory Visit (HOSPITAL_BASED_OUTPATIENT_CLINIC_OR_DEPARTMENT_OTHER): Payer: Self-pay

## 2022-10-28 DIAGNOSIS — L814 Other melanin hyperpigmentation: Secondary | ICD-10-CM | POA: Diagnosis not present

## 2022-10-28 DIAGNOSIS — Z85828 Personal history of other malignant neoplasm of skin: Secondary | ICD-10-CM | POA: Diagnosis not present

## 2022-10-28 DIAGNOSIS — D225 Melanocytic nevi of trunk: Secondary | ICD-10-CM | POA: Diagnosis not present

## 2022-10-28 DIAGNOSIS — L821 Other seborrheic keratosis: Secondary | ICD-10-CM | POA: Diagnosis not present

## 2022-11-05 DIAGNOSIS — R972 Elevated prostate specific antigen [PSA]: Secondary | ICD-10-CM | POA: Diagnosis not present

## 2022-11-21 DIAGNOSIS — E041 Nontoxic single thyroid nodule: Secondary | ICD-10-CM | POA: Diagnosis not present

## 2022-11-28 ENCOUNTER — Other Ambulatory Visit: Payer: Self-pay | Admitting: Cardiology

## 2022-12-09 DIAGNOSIS — E041 Nontoxic single thyroid nodule: Secondary | ICD-10-CM | POA: Diagnosis not present

## 2022-12-10 DIAGNOSIS — E041 Nontoxic single thyroid nodule: Secondary | ICD-10-CM | POA: Diagnosis not present

## 2022-12-12 ENCOUNTER — Other Ambulatory Visit: Payer: Self-pay | Admitting: Cardiology

## 2023-04-28 DIAGNOSIS — Z85828 Personal history of other malignant neoplasm of skin: Secondary | ICD-10-CM | POA: Diagnosis not present

## 2023-04-28 DIAGNOSIS — L814 Other melanin hyperpigmentation: Secondary | ICD-10-CM | POA: Diagnosis not present

## 2023-04-28 DIAGNOSIS — L821 Other seborrheic keratosis: Secondary | ICD-10-CM | POA: Diagnosis not present

## 2023-04-28 DIAGNOSIS — L918 Other hypertrophic disorders of the skin: Secondary | ICD-10-CM | POA: Diagnosis not present

## 2023-09-04 ENCOUNTER — Inpatient Hospital Stay (HOSPITAL_BASED_OUTPATIENT_CLINIC_OR_DEPARTMENT_OTHER)
Admission: EM | Admit: 2023-09-04 | Discharge: 2023-09-05 | DRG: 565 | Disposition: A | Discharge status: Home / Self Care | Payer: Worker's Compensation | Attending: Internal Medicine | Admitting: Internal Medicine

## 2023-09-04 ENCOUNTER — Emergency Department (HOSPITAL_BASED_OUTPATIENT_CLINIC_OR_DEPARTMENT_OTHER): Payer: Self-pay

## 2023-09-04 ENCOUNTER — Encounter (HOSPITAL_BASED_OUTPATIENT_CLINIC_OR_DEPARTMENT_OTHER): Payer: Self-pay

## 2023-09-04 DIAGNOSIS — Z87891 Personal history of nicotine dependence: Secondary | ICD-10-CM

## 2023-09-04 DIAGNOSIS — T8743 Infection of amputation stump, right lower extremity: Secondary | ICD-10-CM | POA: Diagnosis present

## 2023-09-04 DIAGNOSIS — H903 Sensorineural hearing loss, bilateral: Secondary | ICD-10-CM | POA: Diagnosis present

## 2023-09-04 DIAGNOSIS — Z85828 Personal history of other malignant neoplasm of skin: Secondary | ICD-10-CM | POA: Diagnosis not present

## 2023-09-04 DIAGNOSIS — S78111A Complete traumatic amputation at level between right hip and knee, initial encounter: Secondary | ICD-10-CM | POA: Diagnosis not present

## 2023-09-04 DIAGNOSIS — E669 Obesity, unspecified: Secondary | ICD-10-CM | POA: Diagnosis present

## 2023-09-04 DIAGNOSIS — R7303 Prediabetes: Secondary | ICD-10-CM | POA: Diagnosis present

## 2023-09-04 DIAGNOSIS — Z79899 Other long term (current) drug therapy: Secondary | ICD-10-CM | POA: Diagnosis not present

## 2023-09-04 DIAGNOSIS — Z6834 Body mass index (BMI) 34.0-34.9, adult: Secondary | ICD-10-CM

## 2023-09-04 DIAGNOSIS — Z8616 Personal history of COVID-19: Secondary | ICD-10-CM

## 2023-09-04 DIAGNOSIS — Y835 Amputation of limb(s) as the cause of abnormal reaction of the patient, or of later complication, without mention of misadventure at the time of the procedure: Secondary | ICD-10-CM | POA: Diagnosis present

## 2023-09-04 DIAGNOSIS — Z888 Allergy status to other drugs, medicaments and biological substances status: Secondary | ICD-10-CM

## 2023-09-04 DIAGNOSIS — Z7982 Long term (current) use of aspirin: Secondary | ICD-10-CM

## 2023-09-04 DIAGNOSIS — T8740 Infection of amputation stump, unspecified extremity: Secondary | ICD-10-CM | POA: Diagnosis present

## 2023-09-04 DIAGNOSIS — Z8614 Personal history of Methicillin resistant Staphylococcus aureus infection: Secondary | ICD-10-CM | POA: Diagnosis not present

## 2023-09-04 DIAGNOSIS — L03115 Cellulitis of right lower limb: Secondary | ICD-10-CM | POA: Diagnosis present

## 2023-09-04 DIAGNOSIS — I10 Essential (primary) hypertension: Secondary | ICD-10-CM | POA: Diagnosis present

## 2023-09-04 LAB — COMPREHENSIVE METABOLIC PANEL
ALT: 27 U/L (ref 0–44)
AST: 20 U/L (ref 15–41)
Albumin: 3.6 g/dL (ref 3.5–5.0)
Alkaline Phosphatase: 52 U/L (ref 38–126)
Anion gap: 8 (ref 5–15)
BUN: 13 mg/dL (ref 8–23)
CO2: 22 mmol/L (ref 22–32)
Calcium: 8.8 mg/dL — ABNORMAL LOW (ref 8.9–10.3)
Chloride: 103 mmol/L (ref 98–111)
Creatinine, Ser: 0.8 mg/dL (ref 0.61–1.24)
GFR, Estimated: >60 mL/min (ref >60)
Glucose, Bld: 99 mg/dL (ref 70–99)
Potassium: 3.7 mmol/L (ref 3.5–5.1)
Sodium: 133 mmol/L — ABNORMAL LOW (ref 135–145)
Total Bilirubin: 0.4 mg/dL (ref 0.0–1.2)
Total Protein: 7.2 g/dL (ref 6.5–8.1)

## 2023-09-04 LAB — CBC WITH DIFFERENTIAL/PLATELET
Abs Immature Granulocytes: 0.01 10*3/uL (ref 0.00–0.07)
Basophils Absolute: 0.1 10*3/uL (ref 0.0–0.1)
Basophils Relative: 1 %
Eosinophils Absolute: 0.4 10*3/uL (ref 0.0–0.5)
Eosinophils Relative: 5 %
HCT: 43.2 % (ref 39.0–52.0)
Hemoglobin: 14.5 g/dL (ref 13.0–17.0)
Immature Granulocytes: 0 %
Lymphocytes Relative: 37 %
Lymphs Abs: 2.7 10*3/uL (ref 0.7–4.0)
MCH: 29.2 pg (ref 26.0–34.0)
MCHC: 33.6 g/dL (ref 30.0–36.0)
MCV: 87.1 fL (ref 80.0–100.0)
Monocytes Absolute: 0.5 10*3/uL (ref 0.1–1.0)
Monocytes Relative: 6 %
Neutro Abs: 3.8 10*3/uL (ref 1.7–7.7)
Neutrophils Relative %: 51 %
Platelets: 266 10*3/uL (ref 150–400)
RBC: 4.96 MIL/uL (ref 4.22–5.81)
RDW: 13.1 % (ref 11.5–15.5)
WBC: 7.3 10*3/uL (ref 4.0–10.5)
nRBC: 0 % (ref 0.0–0.2)

## 2023-09-04 LAB — LACTIC ACID, PLASMA: Lactic Acid, Venous: 1.1 mmol/L (ref 0.5–1.9)

## 2023-09-04 LAB — C-REACTIVE PROTEIN: CRP: 0.6 mg/dL (ref <1.0)

## 2023-09-04 LAB — SEDIMENTATION RATE: Sed Rate: 19 mm/h — ABNORMAL HIGH (ref 0–16)

## 2023-09-04 MED ORDER — ACETAMINOPHEN 325 MG PO TABS
650.0000 mg | ORAL_TABLET | Freq: Once | ORAL | Status: AC
  Administered 2023-09-04: 650 mg via ORAL
  Filled 2023-09-04: qty 2

## 2023-09-04 MED ORDER — ONDANSETRON HCL 4 MG/2ML IJ SOLN: 4.0000 mg | Freq: Four times a day (QID) | INTRAMUSCULAR | Status: DC | PRN

## 2023-09-04 MED ORDER — PIPERACILLIN-TAZOBACTAM 3.375 G IVPB
3.3750 g | Freq: Three times a day (TID) | INTRAVENOUS | Status: DC
  Administered 2023-09-04 – 2023-09-05 (×2): 3.375 g via INTRAVENOUS
  Filled 2023-09-04 (×2): qty 50

## 2023-09-04 MED ORDER — ENOXAPARIN SODIUM 60 MG/0.6ML IJ SOSY
60.0000 mg | PREFILLED_SYRINGE | INTRAMUSCULAR | Status: DC
  Filled 2023-09-04: qty 0.6

## 2023-09-04 MED ORDER — LINEZOLID 600 MG/300ML IV SOLN
600.0000 mg | Freq: Once | INTRAVENOUS | Status: AC
  Administered 2023-09-04: 600 mg via INTRAVENOUS
  Filled 2023-09-04: qty 300

## 2023-09-04 MED ORDER — TETANUS-DIPHTH-ACELL PERTUSSIS 5-2.5-18.5 LF-MCG/0.5 IM SUSY: 0.5000 mL | PREFILLED_SYRINGE | Freq: Once | INTRAMUSCULAR | Status: DC

## 2023-09-04 MED ORDER — HYDROMORPHONE HCL 1 MG/ML IJ SOLN: 0.5000 mg | INTRAMUSCULAR | Status: DC | PRN

## 2023-09-04 MED ORDER — ACETAMINOPHEN 650 MG RE SUPP: 650.0000 mg | Freq: Four times a day (QID) | RECTAL | Status: DC | PRN

## 2023-09-04 MED ORDER — ONDANSETRON HCL 4 MG PO TABS: 4.0000 mg | ORAL_TABLET | Freq: Four times a day (QID) | ORAL | Status: DC | PRN

## 2023-09-04 MED ORDER — LINEZOLID 600 MG/300ML IV SOLN
600.0000 mg | Freq: Two times a day (BID) | INTRAVENOUS | Status: DC
  Administered 2023-09-05: 600 mg via INTRAVENOUS
  Filled 2023-09-04 (×3): qty 300

## 2023-09-04 MED ORDER — HYDRALAZINE HCL 20 MG/ML IJ SOLN
10.0000 mg | INTRAMUSCULAR | Status: DC | PRN
  Administered 2023-09-04: 10 mg via INTRAVENOUS
  Filled 2023-09-04: qty 1

## 2023-09-04 MED ORDER — PIPERACILLIN-TAZOBACTAM 3.375 G IVPB 30 MIN
3.3750 g | Freq: Once | INTRAVENOUS | Status: AC
  Administered 2023-09-04: 3.375 g via INTRAVENOUS
  Filled 2023-09-04: qty 50

## 2023-09-04 MED ORDER — ACETAMINOPHEN 325 MG PO TABS
650.0000 mg | ORAL_TABLET | Freq: Four times a day (QID) | ORAL | Status: DC | PRN
  Administered 2023-09-04: 650 mg via ORAL
  Filled 2023-09-04: qty 2

## 2023-09-04 MED ORDER — ENOXAPARIN SODIUM 40 MG/0.4ML IJ SOSY: 40.0000 mg | PREFILLED_SYRINGE | INTRAMUSCULAR | Status: DC

## 2023-09-04 MED ORDER — SODIUM CHLORIDE 0.9% FLUSH
3.0000 mL | Freq: Once | INTRAVENOUS | Status: AC
  Administered 2023-09-04: 3 mL via INTRAVENOUS
  Filled 2023-09-04: qty 3

## 2023-09-04 NOTE — ED Triage Notes (Signed)
 Pt reports that yesterday he fell at home. Pt has a right BKA and wears a prosthetic. Pt has an open wound that is red and does have some pus-like liquid draining from it.

## 2023-09-04 NOTE — Assessment & Plan Note (Addendum)
 Med rec pending Plan to resume home meds for the moment given SBP 190 today. Hydralazine PRN IV

## 2023-09-04 NOTE — Assessment & Plan Note (Addendum)
 EDP concerned for early NSTI given h/o same in that leg in 2022. However, beyond the initial rapid progression of symptoms (prior to ED presentation), and h/o prior NSTI, he has absolutely no other red flags at time of my exam: No systemic SIRS at this time, ESR 19 and CRP nl, WBC nl. No sub Q air on exam nor on x ray No pain beyond site of redness on exam No pain out of proportion to exam findings Extent of erythema is reportedly improving / receding per patient compared to its extent earlier at time of presentation to ED.   Informed Dr. Harden of patients arrival to unit earlier this evening. Dr. Harden may just see him in AM. Im gonna let pt eat for next 1.5h and just make NPO after MN, as id be very surprised if Dr. Harden took him to OR before tomorrow AM during day hours Empiric zosyn  / LZD H/o MRSA Dilaudid  PRN pain Pt obviously at risk for NSTI in the coming days given h/o same in 2022 in that leg and open wound currently as well as cellulitis in that area. But I'm thinking NSTI is less likely at the time I'm writing this.

## 2023-09-04 NOTE — Progress Notes (Signed)
Patient has arrived to the floor  

## 2023-09-04 NOTE — Progress Notes (Addendum)
 Plan of Care Note for accepted transfer  Patient: Andres [name]    FMW:[id number]  DOA: 09/04/2023     Facility requesting transfer: [facility] Requesting Provider: Nicholaus Carrier, MD Reason for transfer: Cellulitis of right lower extremity Facility course:  68 year old male with past medical history of obesity, necrotizing fasciitis back in 2022 requiring below-knee amputation presenting to [facility] with purulent cellulitis from his right BKA after a fall on 09/03/2023.  Patient noted to have an open wound with erythema and some pus drainage.  Noted to be hemodynamically stable, BP uncontrolled.  Labs unremarkable.  DG right lower extremity showed no evidence of osteomyelitis. EDP discussed with Dr. Burnetta of Ortho care, who stated Dr. Harden will be informed of this patient for further management.  Dr. Burnetta recommended admission to [facility] of which they will consult.  Patient was started on IV antibiotics.  Hospitalist asked to admit patient.  Plan of care: The patient is accepted for admission to Med-surg  unit, at [facility].   Author: Lebron JINNY Cage, MD  09/04/2023  Check [email or web address] for on-call coverage.  Nursing staff, Please call [facility] Admits & Consults System-Wide number on Amion as soon as patient's arrival, so appropriate admitting provider can evaluate the pt.

## 2023-09-04 NOTE — H&P (Signed)
 History and Physical    Patient: Andres [name] FMW:[id number] DOB: 15-Apr-1956 DOA: 09/04/2023 DOS: the patient was seen and examined on 09/04/2023 PCP: Dorina Loving, PA-C  Patient coming from: Home  Chief Complaint:  Chief Complaint  Patient presents with   Fall   HPI: Andres [name] is a 68 y.o. male with medical history significant of HTN, RLE AKA in 2022 due to necrotizing fasciitis.  Pt in to ED with c/o fall, R stump wound, erythema.  He states that yesterday he was working outside and lost his balance and fell to the ground. He states that he was wearing his prosthesis when this happened and felt like his leg twisted in his prosthetic causing skin breakdown. He states that he did not have any wound on his stump prior to his fall. States that in the last 24 hours his wound had become more red, painful, and inflamed and is now draining purulence. He is concerned for recurrence of his necrotizing fascitis and presents for evaluation of same. He states that when he had necrotizing fascitis previously he went from having a less than 1 cm wound to requiring an AKA in the span of only a few days. Denies fevers or chills. He is not diabetic. States that his tdap was updated in 2022.     Review of Systems: As mentioned in the history of present illness. All other systems reviewed and are negative. Past Medical History:  Diagnosis Date   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HCC) 10/20/2020   Abscess of right lower leg    Accelerated hypertension 11/08/2018   Acute blood loss anemia    Allergic rhinitis 10/24/2011   Basal cell carcinoma    Basal cell carcinoma of back 07/10/2011   Cellulitis 10/05/2020   Cellulitis of right lower extremity 10/05/2020   Chest pain 11/08/2018   COVID    COVID-19 virus infection 10/05/2020   Drug induced constipation    Encounter to establish care 07/09/2017   Essential hypertension 08/16/2015   Chronic, not well controlled; I advised patient to  increase losartan  however he wants to work on diet and exercise more first - cont losartan  25mg  daily - check Bps and record - if Bp's continue to trend up patient to go up to 50mg  of losartan  - check BMP today - Signs and symptoms of hypertensive crisis reviewed with patient, included but not limited to, worsening sudden onset headache, blurry vi   History of nonmelanoma skin cancer 07/15/2012   Hypertension    Hyponatremia    Impaired fasting glucose 08/23/2014   Chronic, last A1c was 6.0 - will recheck today   Necrotizing fasciitis of lower leg (HCC)    Obesity (BMI 30-39.9) 08/23/2014   Post-operative pain    Prediabetes    Reactive depression    Sensorineural hearing loss (SNHL), bilateral 08/16/2015   Unilateral AKA, right [facility])    Past Surgical History:  Procedure Laterality Date   AMPUTATION Right 10/15/2020   Procedure: AMPUTATION ABOVE KNEE;  Surgeon: Harden Jerona GAILS, MD;  Location: [facility] OR;  Service: Orthopedics;  Laterality: Right;   FASCIOTOMY Right 10/11/2020   Procedure: RIGHT LEG FASCIOTOMIES AND DEBRIDE ULCER;  Surgeon: Harden Jerona GAILS, MD;  Location: [facility] OR;  Service: Orthopedics;  Laterality: Right;   I & D EXTREMITY Right 10/13/2020   Procedure: REPEAT DEBRIDEMENT RIGHT LEG;  Surgeon: Harden Jerona GAILS, MD;  Location: [facility] OR;  Service: Orthopedics;  Laterality: Right;   Social History:  reports that he  has quit smoking. He has never used smokeless tobacco. He reports that he does not currently use alcohol. He reports that he does not use drugs.  Allergies  Allergen Reactions   Niacin And Related Shortness Of Breath    Family History  Family history unknown: Yes    Prior to Admission medications   Medication Sig Start Date End Date Taking? Authorizing Provider  Ascorbic Acid  (VITAMIN C) 500 MG CHEW Chew 1 tablet by mouth daily.    [provider]  aspirin  81 MG chewable tablet Chew 1 tablet (81 mg total) by mouth daily. 11/10/18   Sebastian Toribio GAILS, MD  B Complex  Vitamins (B-COMPLEX/B-12) TABS Take 1 tablet by mouth daily.    [provider]  clindamycin  (CLEOCIN ) 300 MG capsule Take 1 capsule (300 mg total) by mouth 3 (three) times daily. 10/16/21   Saguier, [place], PA-C  ezetimibe  (ZETIA ) 10 MG tablet Take 1 tablet (10 mg total) by mouth daily. 10/10/22   Saguier, [place], PA-C  gabapentin  (NEURONTIN ) 400 MG capsule Take 400 mg by mouth 2 (two) times daily.    [provider]  lidocaine  (LIDODERM ) 5 % Place 1 patch onto the skin as needed for pain. 09/01/21   [provider]  losartan  (COZAAR ) 25 MG tablet Take 1 tablet (25 mg total) by mouth daily. Patient needs appointment for further refills. 1 st attempt 11/28/22   Edwyna Jennifer SAUNDERS, MD    Physical Exam: Vitals:   09/04/23 1132 09/04/23 1134 09/04/23 1530 09/04/23 1843  BP: (!) 156/99  (!) 175/92 (!) 190/96  Pulse: 77  65 70  Resp: 16  16 17   Temp: 98.3 F (36.8 C)  98.3 F (36.8 C) (!) 97.5 F (36.4 C)  TempSrc: Oral   Oral  SpO2: 97%  92% 99%  Weight:  113.4 kg    Height:  5' 11 (1.803 m)     Constitutional: NAD, calm, comfortable Respiratory: clear to auscultation bilaterally, no wheezing, no crackles. Normal respiratory effort. No accessory muscle use.  Cardiovascular: Regular rate and rhythm, no murmurs / rubs / gallops. No extremity edema. 2+ pedal pulses. No carotid bruits.  Abdomen: no tenderness, no masses palpated. No hepatosplenomegaly. Bowel sounds positive.  Skin:  Neurologic: CN 2-12 grossly intact. Sensation intact, DTR normal. Strength 5/5 in all 4.  Psychiatric: Normal judgment and insight. Alert and oriented x 3. Normal mood.   Data Reviewed:    Labs on Admission: I have personally reviewed following labs and imaging studies  CBC: Recent Labs  Lab 09/04/23 1240  WBC 7.3  NEUTROABS 3.8  HGB 14.5  HCT 43.2  MCV 87.1  PLT 266   Basic Metabolic Panel: Recent Labs  Lab 09/04/23 1240  NA 133*  K 3.7  CL 103  CO2 22  GLUCOSE 99   BUN 13  CREATININE 0.80  CALCIUM 8.8*   GFR: Estimated Creatinine Clearance: 117.8 mL/min (by C-G formula based on SCr of 0.8 mg/dL). Liver Function Tests: Recent Labs  Lab 09/04/23 1240  AST 20  ALT 27  ALKPHOS 52  BILITOT 0.4  PROT 7.2  ALBUMIN 3.6   No results for input(s): LIPASE, AMYLASE in the last 168 hours. No results for input(s): AMMONIA in the last 168 hours. Coagulation Profile: No results for input(s): INR, PROTIME in the last 168 hours. Cardiac Enzymes: No results for input(s): CKTOTAL, CKMB, CKMBINDEX, TROPONINI in the last 168 hours. BNP (last 3 results) No results for input(s): PROBNP in the last  8760 hours. HbA1C: No results for input(s): HGBA1C in the last 72 hours. CBG: No results for input(s): GLUCAP in the last 168 hours. Lipid Profile: No results for input(s): CHOL, HDL, LDLCALC, TRIG, CHOLHDL, LDLDIRECT in the last 72 hours. Thyroid  Function Tests: No results for input(s): TSH, T4TOTAL, FREET4, T3FREE, THYROIDAB in the last 72 hours. Anemia Panel: No results for input(s): VITAMINB12, FOLATE, FERRITIN, TIBC, IRON, RETICCTPCT in the last 72 hours. Urine analysis:    Component Value Date/Time   COLORURINE YELLOW 10/05/2020 1800   APPEARANCEUR CLEAR 10/05/2020 1800   LABSPEC 1.010 10/05/2020 1800   PHURINE 6.0 10/05/2020 1800   GLUCOSEU NEGATIVE 10/05/2020 1800   HGBUR NEGATIVE 10/05/2020 1800   BILIRUBINUR NEGATIVE 10/05/2020 1800   KETONESUR NEGATIVE 10/05/2020 1800   PROTEINUR 30 (A) 10/05/2020 1800   NITRITE NEGATIVE 10/05/2020 1800   LEUKOCYTESUR NEGATIVE 10/05/2020 1800    Radiological Exams on Admission: DG Knee 1-2 Views Right Result Date: 09/04/2023 CLINICAL DATA:  Above-the-knee amputation stump complication. Fell at home yesterday. Open wound that is red and has some pus like liquid draining. Evaluate for osteomyelitis. EXAM: RIGHT KNEE - 1-2 VIEW COMPARISON:  Right femur  radiographs 05/25/2021 FINDINGS: Frontal and lateral views of the distal right thigh. The right hip is excluded. Postsurgical changes are again seen of right above-the-knee amputation. There is some increased chronic cortication of the distal bony amputation stump compared to 05/25/2021. No cortical erosion is seen. Mild lucency and irregularity of the distal amputation stump is grossly similar to prior. There is some curvilinear lucency overlying the distal lateral amputation stump which may represent anormal skin fold, however recommend clinical correlation for the location of the reported clinical wound. Otherwise, no subcutaneous air is seen. IMPRESSION: 1. Postsurgical changes of right above-the-knee amputation. No radiographic evidence of osteomyelitis. 2. Curvilinear lucency overlying the distal lateral amputation stump which may represent a normal skin fold, however recommend clinical correlation for the location of the reported clinical wound. Electronically Signed   By: Tanda Lyons M.D.   On: 09/04/2023 15:38    EKG: Independently reviewed.   Assessment and Plan: * Cellulitis of right lower extremity EDP concerned for early NSTI given h/o same in that leg in 2022. However, beyond the initial rapid progression of symptoms (prior to ED presentation), and h/o prior NSTI, he has absolutely no other red flags at time of my exam: No systemic SIRS at this time, ESR 19 and CRP nl, WBC nl. No sub Q air on exam nor on x ray No pain beyond site of redness on exam No pain out of proportion to exam findings Extent of erythema is reportedly improving / receding per patient compared to its extent earlier at time of presentation to ED.   Informed Dr. Harden of patients arrival to unit earlier this evening. Dr. Harden may just see him in AM. Im gonna let pt eat for next 1.5h and just make NPO after MN, as id be very surprised if Dr. Harden took him to OR before tomorrow AM during day hours Empiric zosyn  /  LZD H/o MRSA Dilaudid  PRN pain Pt obviously at risk for NSTI in the coming days given h/o same in 2022 in that leg and open wound currently as well as cellulitis in that area. But I'm thinking NSTI is less likely at the time I'm writing this.  Accelerated hypertension Med rec pending Plan to resume home meds for the moment given SBP 190 today. Hydralazine  PRN IV  Advance Care Planning:   Code Status: Full Code  Consults: Dr. Harden  Family Communication: No family in room  Severity of Illness: The appropriate patient status for this patient is INPATIENT. Inpatient status is judged to be reasonable and necessary in order to provide the required intensity of service to ensure the patient's safety. The patient's presenting symptoms, physical exam findings, and initial radiographic and laboratory data in the context of their chronic comorbidities is felt to place them at high risk for further clinical deterioration. Furthermore, it is not anticipated that the patient will be medically stable for discharge from the hospital within 2 midnights of admission.   * I certify that at the point of admission it is my clinical judgment that the patient will require inpatient hospital care spanning beyond 2 midnights from the point of admission due to high intensity of service, high risk for further deterioration and high frequency of surveillance required.*  Author: Cayman Brogden M., DO 09/04/2023 7:46 PM  For on call review [email or web address].

## 2023-09-04 NOTE — Progress Notes (Signed)
 Pharmacy Antibiotic Note  Andres [name] is a 68 y.o. male admitted on 09/04/2023 with possible necrotizing fascitis.  Pharmacy has been consulted for Zosyn  dosing. Scr < 1.0. Cultures sent. Lactic acid 1.1, ESR 19, CRP 0.6, WBC 7.3, temp 97.5.   Plan: Start Zosyn  3.375g IV q8h (4 hour infusion).  Height: 5' 11 (180.3 cm) Weight: 113.4 kg (250 lb) IBW/kg (Calculated) : 75.3  Temp (24hrs), Avg:98 F (36.7 C), Min:97.5 F (36.4 C), Max:98.3 F (36.8 C)  Recent Labs  Lab 09/04/23 1240 09/04/23 1533  WBC 7.3  --   CREATININE 0.80  --   LATICACIDVEN  --  1.1    Estimated Creatinine Clearance: 117.8 mL/min (by C-G formula based on SCr of 0.8 mg/dL).    Allergies  Allergen Reactions   Niacin And Related Shortness Of Breath    Antimicrobials this admission: Linezold 1/2 >> c Zosyn  1/2 >> c  Dose adjustments this admission: N/a  Microbiology results: 1/2 BCx: SENT   Thank you for allowing pharmacy to be a part of this patient's care.  Rankin Sams 09/04/2023 7:46 PM

## 2023-09-04 NOTE — ED Provider Notes (Signed)
 [facility] Provider Note   CSN: [id number] Arrival date & time: 09/04/23  1108     History  Chief Complaint  Patient presents with   Andres [name]    Andres [name] is a 68 y.o. male.  Patient with history of hypertension, necrotizing fascitis status post right leg AKA with Dr. Harden in 2022 presents today with complaints of fall, right stump wound. He states that yesterday he was working outside and lost his balance and fell to the ground. He states that he was wearing his prosthesis when this happened and felt like his leg twisted in his prosthetic causing skin breakdown. He states that he did not have any wound on his stump prior to his fall. States that in the last 24 hours his wound had become more red, painful, and inflamed and is now draining purulence. He is concerned for recurrence of his necrotizing fascitis and presents for evaluation of same. He states that when he had necrotizing fascitis previously he went from having a less than 1 cm wound to requiring an AKA in the span of only a few days. Denies fevers or chills. He is not diabetic. States that his tdap was updated in 2022.  The history is provided by the patient. No language interpreter was used.  Fall       Home Medications Prior to Admission medications   Medication Sig Start Date End Date Taking? Authorizing Provider  Ascorbic Acid  (VITAMIN C) 500 MG CHEW Chew 1 tablet by mouth daily.    [provider]  aspirin  81 MG chewable tablet Chew 1 tablet (81 mg total) by mouth daily. 11/10/18   Sebastian Toribio GAILS, MD  B Complex Vitamins (B-COMPLEX/B-12) TABS Take 1 tablet by mouth daily.    [provider]  clindamycin  (CLEOCIN ) 300 MG capsule Take 1 capsule (300 mg total) by mouth 3 (three) times daily. 10/16/21   Saguier, [place], PA-C  ezetimibe  (ZETIA ) 10 MG tablet Take 1 tablet (10 mg total) by mouth daily. 10/10/22   Saguier, [place], PA-C  gabapentin  (NEURONTIN )  400 MG capsule Take 400 mg by mouth 2 (two) times daily.    [provider]  lidocaine  (LIDODERM ) 5 % Place 1 patch onto the skin as needed for pain. 09/01/21   [provider]  losartan  (COZAAR ) 25 MG tablet Take 1 tablet (25 mg total) by mouth daily. Patient needs appointment for further refills. 1 st attempt 11/28/22   Revankar, Jennifer SAUNDERS, MD      Allergies    Niacin and related    Review of Systems   Review of Systems  Skin:  Positive for wound.  All other systems reviewed and are negative.   Physical Exam Updated Vital Signs BP (!) 156/99 (BP Location: Right Arm)   Pulse 77   Temp 98.3 F (36.8 C) (Oral)   Resp 16   Ht 5' 11 (1.803 m)   Wt 113.4 kg   SpO2 97%   BMI 34.87 kg/m  Physical Exam Vitals and nursing note reviewed.  Constitutional:      General: He is not in acute distress.    Appearance: Normal appearance. He is normal weight. He is not ill-appearing, toxic-appearing or diaphoretic.  HENT:     Head: Normocephalic and atraumatic.  Cardiovascular:     Rate and Rhythm: Normal rate.  Pulmonary:     Effort: Pulmonary effort is normal. No respiratory distress.  Musculoskeletal:  General: Normal range of motion.     Cervical back: Normal range of motion.     Comments: Patients right AKA wound at the base of his stump with skin abrasion with purulence and surrounding erythema with trace drainage. No crepitus. Tenderness to palpation throughout. See images below for further  Skin:    General: Skin is warm and dry.  Neurological:     General: No focal deficit present.     Mental Status: He is alert.  Psychiatric:        Mood and Affect: Mood normal.        Behavior: Behavior normal.     ED Results / Procedures / Treatments   Labs (all labs ordered are listed, but only abnormal results are displayed) Labs Reviewed  COMPREHENSIVE METABOLIC PANEL - Abnormal; Notable for the following components:      Result Value   Sodium 133 (*)     Calcium 8.8 (*)    All other components within normal limits  CBC WITH DIFFERENTIAL/PLATELET    EKG None  Radiology No results found.  Procedures .Critical Care  Performed by: Nora Lauraine LABOR, PA-C Authorized by: Dashia Caldeira A, PA-C   Critical care provider statement:    Critical care time (minutes):  35   Critical care was necessary to treat or prevent imminent or life-threatening deterioration of the following conditions: concern for necrotizing fascitis requiring IV antibiotics, surgery consult, and admission.   Critical care was time spent personally by me on the following activities:  Development of treatment plan with patient or surrogate, discussions with consultants, discussions with primary provider, obtaining history from patient or surrogate, evaluation of patient's response to treatment, examination of patient, ordering and review of laboratory studies, ordering and review of radiographic studies, pulse oximetry, re-evaluation of patient's condition and review of old charts   Care discussed with: admitting provider       Medications Ordered in ED Medications  linezolid  (ZYVOX ) IVPB 600 mg (600 mg Intravenous New Bag/Given 09/04/23 1615)  sodium chloride  flush (NS) 0.9 % injection 3 mL (3 mLs Intravenous Given 09/04/23 1252)  piperacillin -tazobactam (ZOSYN ) IVPB 3.375 g (0 g Intravenous Stopped 09/04/23 1616)  acetaminophen  (TYLENOL ) tablet 650 mg (650 mg Oral Given 09/04/23 1611)    ED Course/ Medical Decision Making/ A&P                                 Medical Decision Making Amount and/or Complexity of Data Reviewed Labs: ordered. Radiology: ordered.   This patient is a 68 y.o. male who presents to the ED for concern of right AKA stump wound, this involves an extensive number of treatment options, and is a complaint that carries with it a high risk of complications and morbidity. The emergent differential diagnosis prior to evaluation includes, but is not limited to,   cellulitis, trauma, sepsis, necrotizing fascitis . This is not an exhaustive differential.   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), Abscess of right lower leg, Accelerated hypertension (11/08/2018), Acute blood loss anemia, Allergic rhinitis (10/24/2011), Basal cell carcinoma, Basal cell carcinoma of back (07/10/2011), Cellulitis (10/05/2020), Cellulitis of right lower extremity (10/05/2020), Chest pain (11/08/2018), COVID, COVID-19 virus infection (10/05/2020), Drug induced constipation, Encounter to establish care (07/09/2017), Essential hypertension (08/16/2015), History of nonmelanoma skin cancer (07/15/2012), Hypertension, Hyponatremia, Impaired fasting glucose (08/23/2014),  Necrotizing fasciitis of lower leg (HCC), Obesity (BMI 30-39.9) (08/23/2014), Post-operative pain, Prediabetes, Reactive depression, Sensorineural hearing loss (SNHL), bilateral (08/16/2015), and Unilateral AKA, right (HCC).  Additional history: Chart reviewed. Pertinent results include: patient seen in 2022 for RLE wound, diagnosed with necrotizing fascitis, had AKA with Dr. Harden  Physical Exam: Physical exam performed. The pertinent findings include: per images above, erythema, warmth and tenderness noted to the right stump with open wound with trace amount of purulent drainage.   Lab Tests: I ordered, and personally interpreted labs.  The pertinent results include:  no leukocytosis, Na 133, ESR 19. CRP pending, lactic WNL. Cultures pending   Imaging Studies: I ordered imaging studies including DG right knee. I independently visualized and interpreted imaging which showed   1. Postsurgical changes of right above-the-knee amputation. No radiographic evidence of osteomyelitis. 2. Curvilinear lucency overlying the distal lateral amputation stump which may represent a normal skin fold, however recommend  clinical correlation for the location of the reported clinical wound.  I agree with the radiologist interpretation.   Medications: I ordered medication including tylenol  for pain, linezolid , and zosyn   for concern for developing nec fasc. Reevaluation of the patient after these medicines showed that the patient improved. I have reviewed the patients home medicines and have made adjustments as needed.  Consultations Obtained: I requested consultation with the orthopedics on call Dr. Burnetta,  and discussed lab and imaging findings as well as pertinent plan - they recommend: they discussed patient with Dr. Harden who performed his surgeries previously. They recommend admission to hospitalist at [facility], NPO for now and Dr. Harden will see the patient when he arrives   Disposition: After consideration of the diagnostic results and the patients response to treatment, I feel that patient will require admission with concern for rapid progression of right stump infection with history of necrotizing fascitis, concerning for recurrence of same. Discussed with patient who is understanding and in agreement with this plan.   Discussed patient with hospitalist Dr. Ezenduka who accepts patient for admission.   I discussed this case with my attending physician Dr. Nicholaus who cosigned this note including patient's presenting symptoms, physical exam, and planned diagnostics and interventions. Attending physician stated agreement with plan or made changes to plan which were implemented.    Final Clinical Impression(s) / ED Diagnoses Final diagnoses:  Amputation stump infection [facility])    Rx / DC Orders ED Discharge Orders     None         Nora Lauraine DELENA DEVONNA 09/04/23 1637    Nicholaus Cassondra DEL, MD 09/11/23 1043

## 2023-09-05 ENCOUNTER — Other Ambulatory Visit: Payer: Self-pay

## 2023-09-05 LAB — BASIC METABOLIC PANEL
Anion gap: 10 (ref 5–15)
BUN: 10 mg/dL (ref 8–23)
CO2: 22 mmol/L (ref 22–32)
Calcium: 8.8 mg/dL — ABNORMAL LOW (ref 8.9–10.3)
Chloride: 106 mmol/L (ref 98–111)
Creatinine, Ser: 0.87 mg/dL (ref 0.61–1.24)
GFR, Estimated: >60 mL/min (ref >60)
Glucose, Bld: 124 mg/dL — ABNORMAL HIGH (ref 70–99)
Potassium: 3.8 mmol/L (ref 3.5–5.1)
Sodium: 138 mmol/L (ref 135–145)

## 2023-09-05 LAB — CBC
HCT: 43.3 % (ref 39.0–52.0)
Hemoglobin: 14.5 g/dL (ref 13.0–17.0)
MCH: 29.5 pg (ref 26.0–34.0)
MCHC: 33.5 g/dL (ref 30.0–36.0)
MCV: 88 fL (ref 80.0–100.0)
Platelets: 251 10*3/uL (ref 150–400)
RBC: 4.92 MIL/uL (ref 4.22–5.81)
RDW: 13.1 % (ref 11.5–15.5)
WBC: 7.3 10*3/uL (ref 4.0–10.5)
nRBC: 0 % (ref 0.0–0.2)

## 2023-09-05 LAB — MRSA NEXT GEN BY PCR, NASAL: MRSA by PCR Next Gen: NOT DETECTED

## 2023-09-05 LAB — HIV ANTIBODY (ROUTINE TESTING W REFLEX): HIV Screen 4th Generation wRfx: NONREACTIVE

## 2023-09-05 MED ORDER — MUPIROCIN 2 % EX OINT: 1.0000 | TOPICAL_OINTMENT | Freq: Two times a day (BID) | CUTANEOUS | 0 refills | Status: AC

## 2023-09-05 MED ORDER — DOXYCYCLINE HYCLATE 100 MG PO TABS
100.0000 mg | ORAL_TABLET | Freq: Two times a day (BID) | ORAL | 0 refills | Status: AC
Start: 2023-09-05 — End: 2023-09-10

## 2023-09-05 NOTE — Plan of Care (Signed)

## 2023-09-05 NOTE — Discharge Summary (Signed)
 Physician Discharge Summary   Andres [name] FMW:[id number] DOB: April 11, 1956 DOA: 09/04/2023  PCP: Dorina Loving, PA-C  Admit date: 09/04/2023 Discharge date:  09/05/2023  Admitted From: Home Disposition:  Home Discharging physician: Alm Apo, MD Barriers to discharge: none   Discharge Condition: stable CODE STATUS: Full Diet recommendation:  Diet Orders (From admission, onward)     Start     Ordered   09/05/23 1324  Diet regular Room service appropriate? Yes; Fluid consistency: Thin  Diet effective now       Question Answer Comment  Room service appropriate? Yes   Fluid consistency: Thin      09/05/23 1323   09/05/23 0000  Diet general        09/05/23 1232            Hospital Course: Andres [name] is a 68 yo male with PMH RLE nec fas s/p R AKA 10/15/20, HTN, anemia, BCC, HTN, depression who presented with a wound to his right stump.  He was ambulating with his prosthesis outside and it twisted on his stump resulting in a wound on the distal end.  It was noted to be red, painful, with mild drainage.  Due to this, he presented for further evaluation. Right stump x-ray showed postsurgical changes with no obvious acute abnormalities.  The wound appeared superficial with very minimal erythema and no edema.  He was initially started on Zosyn  and Zyvox  and admitted for further monitoring and evaluation with orthopedic surgery. He had clinical improvement and was transitioned to doxycycline  to complete a 5-day course at discharge and continue wound care to his stump with Bactroban  ointment.   The patient's acute and chronic medical conditions were treated accordingly. On day of discharge, patient was felt deemed stable for discharge. Patient/family member advised to call PCP or come back to ER if needed.   Principal Diagnosis: Cellulitis of right lower extremity  Discharge Diagnoses: Active Hospital Problems   Diagnosis Date Noted   Cellulitis of right lower extremity  10/05/2020    Priority: High   Unilateral AKA, right [facility])     Priority: Medium    Essential hypertension 08/16/2015    Resolved Hospital Problems   Diagnosis Date Noted Date Resolved   Accelerated hypertension 11/08/2018 09/05/2023    Priority: Low     Discharge Instructions     Diet general   Complete by: As directed    Discharge wound care:   Complete by: As directed    Avoid prosthesis until wound heals. Apply bactroban  per instructions for up to 5 days then can use neosporin if still healing. Keep wound covered with gauze or bandage after applying ointments.   Increase activity slowly   Complete by: As directed       Allergies as of 09/05/2023       Reactions   Niacin And Related Shortness Of Breath        Medication List     TAKE these medications    doxycycline  100 MG tablet Commonly known as: VIBRA -TABS Take 1 tablet (100 mg total) by mouth 2 (two) times daily for 5 days.   FISH OIL PO Take 1 capsule by mouth every evening.   mupirocin  ointment 2 % Commonly known as: BACTROBAN  Apply 1 Application topically 2 (two) times daily for 5 days.   VITAMIN B-1 PO Take 1 tablet by mouth in the morning.   VITAMIN C PO Take 1 tablet by mouth in the morning.   VITAMIN D-3 PO Take 1  tablet by mouth every evening.               Discharge Care Instructions  (From admission, onward)           Start     Ordered   09/05/23 0000  Discharge wound care:       Comments: Avoid prosthesis until wound heals. Apply bactroban  per instructions for up to 5 days then can use neosporin if still healing. Keep wound covered with gauze or bandage after applying ointments.   09/05/23 1232            Allergies  Allergen Reactions   Niacin And Related Shortness Of Breath    Consultations: Orthopedic surgery  Procedures:   Discharge Exam: BP (!) 167/87 (BP Location: Left Arm)   Pulse 75   Temp 97.8 F (36.6 C) (Oral)   Resp 20   Ht 5' 11 (1.803 m)    Wt 113.4 kg   SpO2 97%   BMI 34.87 kg/m  Physical Exam Constitutional:      General: He is not in acute distress.    Appearance: Normal appearance.  HENT:     Head: Normocephalic and atraumatic.     Mouth/Throat:     Mouth: Mucous membranes are moist.  Eyes:     Extraocular Movements: Extraocular movements intact.  Cardiovascular:     Rate and Rhythm: Normal rate and regular rhythm.  Pulmonary:     Effort: Pulmonary effort is normal. No respiratory distress.     Breath sounds: Normal breath sounds. No wheezing.  Abdominal:     General: Bowel sounds are normal. There is no distension.     Palpations: Abdomen is soft.     Tenderness: There is no abdominal tenderness.  Musculoskeletal:     Cervical back: Normal range of motion and neck supple.     Comments: Superficial ~3cm wound/abrasion noted to RLE stump with no erythema nor any edema and no drainage apreciated. Also non-tender  Skin:    General: Skin is warm and dry.  Neurological:     General: No focal deficit present.     Mental Status: He is alert.  Psychiatric:        Mood and Affect: Mood normal.        Behavior: Behavior normal.      The results of significant diagnostics from this hospitalization (including imaging, microbiology, ancillary and laboratory) are listed below for reference.   Microbiology: Recent Results (from the past 240 hours)  Blood culture (routine x 2)     Status: None (Preliminary result)   Collection Time: 09/04/23  3:02 PM   Specimen: BLOOD RIGHT HAND  Result Value Ref Range Status   Specimen Description   Final    BLOOD RIGHT HAND Performed at [facility], [address], [place], [place] [address]    Special Requests   Final    BOTTLES DRAWN AEROBIC ONLY Blood Culture results may not be optimal due to an inadequate volume of blood received in culture bottles Performed at [facility], [address] [place], [place] [address]    Culture   Final    NO GROWTH < 24  HOURS Performed at [facility], [address], [place], [place] [address]    Report Status PENDING  Incomplete  Blood culture (routine x 2)     Status: None (Preliminary result)   Collection Time: 09/04/23  3:07 PM   Specimen: BLOOD RIGHT  FOREARM  Result Value Ref Range Status   Specimen Description   Final    BLOOD RIGHT FOREARM Performed at [facility], [address], [place], [place] [address]    Special Requests   Final    BOTTLES DRAWN AEROBIC AND ANAEROBIC Blood Culture adequate volume Performed at [facility], [address] [place], [place] [address]    Culture   Final    NO GROWTH < 24 HOURS Performed at [facility], [address], [place], [place] [address]    Report Status PENDING  Incomplete  MRSA Next Gen by PCR, Nasal     Status: None   Collection Time: 09/05/23 10:00 AM   Specimen: Nasal Mucosa; Nasal Swab  Result Value Ref Range Status   MRSA by PCR Next Gen NOT DETECTED NOT DETECTED Final    Comment: (NOTE) The GeneXpert MRSA Assay (FDA approved for NASAL specimens only), is one component of a comprehensive MRSA colonization surveillance program. It is not intended to diagnose MRSA infection nor to guide or monitor treatment for MRSA infections. Test performance is not FDA approved in patients less than 38 years old. Performed at [facility], [address] [place], [place] [address]      Labs: BNP (last 3 results) No results for input(s): BNP in the last 8760 hours. Basic Metabolic Panel: Recent Labs  Lab 09/04/23 1240 09/05/23 0803  NA 133* 138  K 3.7 3.8  CL 103 106  CO2 22 22  GLUCOSE 99 124*  BUN 13 10  CREATININE 0.80 0.87  CALCIUM 8.8* 8.8*   Liver Function Tests: Recent Labs  Lab 09/04/23 1240  AST 20  ALT 27  ALKPHOS 52  BILITOT 0.4  PROT 7.2  ALBUMIN 3.6   No results for input(s): LIPASE, AMYLASE in the last 168 hours. No results for input(s): AMMONIA in the last 168  hours. CBC: Recent Labs  Lab 09/04/23 1240 09/05/23 0803  WBC 7.3 7.3  NEUTROABS 3.8  --   HGB 14.5 14.5  HCT 43.2 43.3  MCV 87.1 88.0  PLT 266 251   Cardiac Enzymes: No results for input(s): CKTOTAL, CKMB, CKMBINDEX, TROPONINI in the last 168 hours. BNP: Invalid input(s): POCBNP CBG: No results for input(s): GLUCAP in the last 168 hours. D-Dimer No results for input(s): DDIMER in the last 72 hours. Hgb A1c No results for input(s): HGBA1C in the last 72 hours. Lipid Profile No results for input(s): CHOL, HDL, LDLCALC, TRIG, CHOLHDL, LDLDIRECT in the last 72 hours. Thyroid  function studies No results for input(s): TSH, T4TOTAL, T3FREE, THYROIDAB in the last 72 hours.  Invalid input(s): FREET3 Anemia work up No results for input(s): VITAMINB12, FOLATE, FERRITIN, TIBC, IRON, RETICCTPCT in the last 72 hours. Urinalysis    Component Value Date/Time   COLORURINE YELLOW 10/05/2020 1800   APPEARANCEUR CLEAR 10/05/2020 1800   LABSPEC 1.010 10/05/2020 1800   PHURINE 6.0 10/05/2020 1800   GLUCOSEU NEGATIVE 10/05/2020 1800   HGBUR NEGATIVE 10/05/2020 1800   BILIRUBINUR NEGATIVE 10/05/2020 1800   KETONESUR NEGATIVE 10/05/2020 1800   PROTEINUR 30 (A) 10/05/2020 1800   NITRITE NEGATIVE 10/05/2020 1800   LEUKOCYTESUR NEGATIVE 10/05/2020 1800   Sepsis Labs Recent Labs  Lab 09/04/23 1240 09/05/23 0803  WBC 7.3 7.3   Microbiology Recent Results (from the past 240 hours)  Blood culture (routine x 2)     Status: None (Preliminary result)   Collection Time: 09/04/23  3:02 PM  Specimen: BLOOD RIGHT HAND  Result Value Ref Range Status   Specimen Description   Final    BLOOD RIGHT HAND Performed at [facility], [address] [place], [place] [address]    Special Requests   Final    BOTTLES DRAWN AEROBIC ONLY Blood Culture results may not be optimal due to an inadequate volume of blood received in culture  bottles Performed at [facility], [address] [place], [place] [address]    Culture   Final    NO GROWTH < 24 HOURS Performed at [facility], [address] [place], [place] [address]    Report Status PENDING  Incomplete  Blood culture (routine x 2)     Status: None (Preliminary result)   Collection Time: 09/04/23  3:07 PM   Specimen: BLOOD RIGHT FOREARM  Result Value Ref Range Status   Specimen Description   Final    BLOOD RIGHT FOREARM Performed at [facility], [address], [place], [place] [address]    Special Requests   Final    BOTTLES DRAWN AEROBIC AND ANAEROBIC Blood Culture adequate volume Performed at [facility], [address] [place], [place] [address]    Culture   Final    NO GROWTH < 24 HOURS Performed at [facility], [address], [place], [place] [address]    Report Status PENDING  Incomplete  MRSA Next Gen by PCR, Nasal     Status: None   Collection Time: 09/05/23 10:00 AM   Specimen: Nasal Mucosa; Nasal Swab  Result Value Ref Range Status   MRSA by PCR Next Gen NOT DETECTED NOT DETECTED Final    Comment: (NOTE) The GeneXpert MRSA Assay (FDA approved for NASAL specimens only), is one component of a comprehensive MRSA colonization surveillance program. It is not intended to diagnose MRSA infection nor to guide or monitor treatment for MRSA infections. Test performance is not FDA approved in patients less than 11 years old. Performed at [facility], [address] [place], [place] [address]     Procedures/Studies: DG Knee 1-2 Views Right Result Date: 09/04/2023 CLINICAL DATA:  Above-the-knee amputation stump complication. Fell at home yesterday. Open wound that is red and has some pus like liquid draining. Evaluate for osteomyelitis. EXAM: RIGHT KNEE - 1-2 VIEW COMPARISON:  Right femur radiographs 05/25/2021 FINDINGS: Frontal and lateral views of the distal right thigh. The right hip is excluded.  Postsurgical changes are again seen of right above-the-knee amputation. There is some increased chronic cortication of the distal bony amputation stump compared to 05/25/2021. No cortical erosion is seen. Mild lucency and irregularity of the distal amputation stump is grossly similar to prior. There is some curvilinear lucency overlying the distal lateral amputation stump which may represent anormal skin fold, however recommend clinical correlation for the location of the reported clinical wound. Otherwise, no subcutaneous air is seen. IMPRESSION: 1. Postsurgical changes of right above-the-knee amputation. No radiographic evidence of osteomyelitis. 2. Curvilinear lucency overlying the distal lateral amputation stump which may represent a normal skin fold, however recommend clinical correlation for the location of the reported clinical wound. Electronically Signed   By: Tanda Lyons M.D.   On: 09/04/2023 15:38     Time coordinating discharge: Over 30 minutes    Alm Apo, MD  [facility] 09/05/2023, 1:46 PM

## 2023-09-05 NOTE — Hospital Course (Signed)
 Andres [name] is a 68 yo male with PMH RLE nec fas s/p R AKA 10/15/20, HTN, anemia, BCC, HTN, depression who presented with a wound to his right stump.  He was ambulating with his prosthesis outside and it twisted on his stump resulting in a wound on the distal end.  It was noted to be red, painful, with mild drainage.  Due to this, he presented for further evaluation. Right stump x-ray showed postsurgical changes with no obvious acute abnormalities.  The wound appeared superficial with very minimal erythema and no edema.  He was initially started on Zosyn  and Zyvox  and admitted for further monitoring and evaluation with orthopedic surgery. He had clinical improvement and was transitioned to doxycycline  to complete a 5-day course at discharge and continue wound care to his stump with Bactroban  ointment.

## 2023-09-09 LAB — CULTURE, BLOOD (ROUTINE X 2)
Culture: NO GROWTH
Culture: NO GROWTH
Special Requests: ADEQUATE

## 2023-09-23 ENCOUNTER — Ambulatory Visit (INDEPENDENT_AMBULATORY_CARE_PROVIDER_SITE_OTHER): Payer: Worker's Compensation | Admitting: Orthopedic Surgery

## 2023-09-23 ENCOUNTER — Encounter: Payer: Self-pay | Admitting: Orthopedic Surgery

## 2023-09-23 DIAGNOSIS — T8781 Dehiscence of amputation stump: Secondary | ICD-10-CM | POA: Diagnosis not present

## 2023-09-23 DIAGNOSIS — T879 Unspecified complications of amputation stump: Secondary | ICD-10-CM

## 2023-09-23 MED ORDER — DOXYCYCLINE HYCLATE 100 MG PO TABS: 100.0000 mg | ORAL_TABLET | Freq: Two times a day (BID) | ORAL | 0 refills | Status: DC

## 2023-09-23 NOTE — Progress Notes (Signed)
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  Patient recently went to the emergency room on January 2.  Patient reports an injury that he was knocked down by a dog and had a twisting injury with his residual limb within the socket causing a ulcer.  Assessment & Plan: Visit Diagnoses:  1. AKA stump complication (HCC)   2. Dehiscence of amputation stump of right lower extremity (HCC)     Plan: Plan: Will start wound cleansing with Dial soap and water.  Apply Vashe and gauze to the wound to help with debridement.  Plan to proceed with application of Kerecis micro graft once the wound bed has healthy granulation tissue.  Patient will call if there is any change in his residual limb such as cellulitis pain or drainage.  Follow-Up Instructions: Return in about 1 week (around 09/30/2023).   Ortho Exam  Patient is alert, oriented, no adenopathy, well-dressed, normal affect, normal respiratory effort. Examination patient has a strong palpable femoral pulse.  He has good hair growth in the residual limb.  There is no fluctuance no tenderness to palpation in the thigh.  There is no cellulitis.  Over the residual limb medially patient has an ulcer that is 3 x 4 cm and 0.1 mm deep covered in fibrinous tissue.  There is no tunneling no drainage.  There is no ulcer directly over the distal femur.  Imaging: No results found. No images are attached to the encounter.  Labs: Lab Results  Component Value Date   HGBA1C 6.5 10/09/2022   HGBA1C 6.0 10/01/2021   HGBA1C 5.9 (H) 10/18/2020   ESRSEDRATE  19 (H) 09/04/2023   ESRSEDRATE 61 (H) 12/21/2020   ESRSEDRATE 101 (H) 10/14/2020   CRP 0.6 09/04/2023   CRP <1.0 12/21/2020   CRP 8.6 (H) 10/14/2020   REPTSTATUS 09/09/2023 FINAL 09/04/2023   GRAMSTAIN  10/13/2020    FEW WBC PRESENT, PREDOMINANTLY PMN NO ORGANISMS SEEN    CULT  09/04/2023    NO GROWTH 5 DAYS Performed at [facility], [address] [place], [place] [address]    LABORGA METHICILLIN RESISTANT STAPHYLOCOCCUS AUREUS 10/11/2020     Lab Results  Component Value Date   ALBUMIN 3.6 09/04/2023   ALBUMIN 3.9 10/09/2022   ALBUMIN 3.9 10/01/2021    No results found for: "MG" No results found for: "VD25OH"  No results found for: "PREALBUMIN"    Latest Ref Rng & Units 09/05/2023    8:03 AM 09/04/2023   12:40 PM 10/09/2022   10:39 AM  CBC EXTENDED  WBC 4.0 - 10.5 K/uL 7.3  7.3  6.4   RBC 4.22 - 5.81 MIL/uL 4.92  4.96  4.58   Hemoglobin 13.0 - 17.0 g/dL 82.9  56.2  13.0   HCT 39.0 - 52.0 % 43.3  43.2  40.4   Platelets 150 - 400 K/uL 251  266  292.0  NEUT# 1.7 - 7.7 K/uL  3.8  3.2   Lymph# 0.7 - 4.0 K/uL  2.7  2.4      There is no height or weight on file to calculate BMI.  Orders:  No orders of the defined types were placed in this encounter.  Meds ordered this encounter  Medications   doxycycline (VIBRA-TABS) 100 MG tablet    Sig: Take 1 tablet (100 mg total) by mouth 2 (two) times daily.    Dispense:  30 tablet    Refill:  0     Procedures: No procedures performed  Clinical Data: No additional findings.  ROS:  All other systems negative, except as noted in the HPI. Review of Systems  Objective: Vital Signs: There were no vitals taken for this visit.  Specialty Comments:  No specialty comments available.  PMFS History: Patient Active Problem List   Diagnosis Date Noted   Basal cell carcinoma 02/26/2021   Hypertension 02/26/2021   Reactive depression    Drug induced constipation    Post-operative pain    Above knee amputation of  right lower extremity (HCC) 10/20/2020   Unilateral AKA, right (HCC)    Acute blood loss anemia    Prediabetes    Necrotizing fasciitis of lower leg (HCC)    Abscess of right lower leg    Cellulitis 10/05/2020   Cellulitis of right lower extremity 10/05/2020   COVID-19 virus infection 10/05/2020   COVID    Hyponatremia    Chest pain 11/08/2018   Encounter to establish care 07/09/2017   Essential hypertension 08/16/2015   Sensorineural hearing loss (SNHL), bilateral 08/16/2015   Impaired fasting glucose 08/23/2014   Obesity (BMI 30-39.9) 08/23/2014   History of nonmelanoma skin cancer 07/15/2012   Allergic rhinitis 10/24/2011   Basal cell carcinoma of back 07/10/2011   Past Medical History:  Diagnosis Date   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HCC) 10/20/2020   Abscess of right lower leg    Accelerated hypertension 11/08/2018   Acute blood loss anemia    Allergic rhinitis 10/24/2011   Basal cell carcinoma    Basal cell carcinoma of back 07/10/2011   Cellulitis 10/05/2020   Cellulitis of right lower extremity 10/05/2020   Chest pain 11/08/2018   COVID    COVID-19 virus infection 10/05/2020   Drug induced constipation    Encounter to establish care 07/09/2017   Essential hypertension 08/16/2015   Chronic, not well controlled; I advised patient to increase losartan however he wants to work on diet and exercise more first - cont losartan 25mg  daily - check Bps and record - if Bp's continue to trend up patient to go up to 50mg  of losartan - check BMP today - Signs and symptoms of hypertensive crisis reviewed with patient, included but not limited to, worsening sudden onset headache, blurry vi   History of nonmelanoma skin cancer 07/15/2012   Hypertension    Hyponatremia    Impaired fasting glucose 08/23/2014   Chronic, last A1c was 6.0 - will recheck today   Necrotizing fasciitis of lower leg (HCC)    Obesity (BMI 30-39.9) 08/23/2014   Post-operative pain    Prediabetes     Reactive depression    Sensorineural hearing loss (SNHL), bilateral 08/16/2015   Unilateral AKA, right (HCC)     Family History  Family history unknown: Yes    Past Surgical History:  Procedure Laterality Date   AMPUTATION Right 10/15/2020   Procedure: AMPUTATION ABOVE KNEE;  Surgeon: Lajoyce Corners,  Randa Evens, MD;  Location: [facility] OR;  Service: Orthopedics;  Laterality: Right;   FASCIOTOMY Right 10/11/2020   Procedure: RIGHT LEG FASCIOTOMIES AND DEBRIDE ULCER;  Surgeon: Nadara Mustard, MD;  Location: [facility] OR;  Service: Orthopedics;  Laterality: Right;   I & D EXTREMITY Right 10/13/2020   Procedure: REPEAT DEBRIDEMENT RIGHT LEG;  Surgeon: Nadara Mustard, MD;  Location: [facility] OR;  Service: Orthopedics;  Laterality: Right;   Social History   Occupational History   Not on file  Tobacco Use   Smoking status: Former   Smokeless tobacco: Never  Vaping Use   Vaping status: Never Used  Substance and Sexual Activity   Alcohol use: Not Currently   Drug use: Never   Sexual activity: Yes

## 2023-09-29 ENCOUNTER — Ambulatory Visit: Payer: Medicare Other | Admitting: Orthopedic Surgery

## 2023-10-13 ENCOUNTER — Encounter: Payer: BLUE CROSS/BLUE SHIELD | Admitting: Medical

## 2023-11-06 ENCOUNTER — Encounter: Payer: Medicare Other | Admitting: Medical

## 2023-11-18 ENCOUNTER — Encounter: Payer: Medicare Other | Admitting: Medical

## 2023-11-26 ENCOUNTER — Encounter: Admitting: Medical

## 2023-11-27 ENCOUNTER — Encounter: Payer: Self-pay | Admitting: Medical

## 2023-11-27 ENCOUNTER — Ambulatory Visit: Admitting: Medical

## 2023-11-27 VITALS — BP 145/80 | HR 70 | Resp 18 | Ht 71.0 in | Wt 236.0 lb

## 2023-11-27 DIAGNOSIS — R972 Elevated prostate specific antigen [PSA]: Secondary | ICD-10-CM

## 2023-11-27 DIAGNOSIS — I10 Essential (primary) hypertension: Secondary | ICD-10-CM

## 2023-11-27 DIAGNOSIS — Z0000 Encounter for general adult medical examination without abnormal findings: Secondary | ICD-10-CM

## 2023-11-27 DIAGNOSIS — Z89611 Acquired absence of right leg above knee: Secondary | ICD-10-CM | POA: Diagnosis not present

## 2023-11-27 DIAGNOSIS — R5383 Other fatigue: Secondary | ICD-10-CM | POA: Diagnosis not present

## 2023-11-27 LAB — CBC WITH DIFFERENTIAL/PLATELET
Basophils Absolute: 0.1 10*3/uL (ref 0.0–0.1)
Basophils Relative: 1.2 % (ref 0.0–3.0)
Eosinophils Absolute: 0.3 10*3/uL (ref 0.0–0.7)
Eosinophils Relative: 5.2 % — ABNORMAL HIGH (ref 0.0–5.0)
HCT: 44.6 % (ref 39.0–52.0)
Hemoglobin: 14.9 g/dL (ref 13.0–17.0)
Lymphocytes Relative: 37.5 % (ref 12.0–46.0)
Lymphs Abs: 2.4 10*3/uL (ref 0.7–4.0)
MCHC: 33.5 g/dL (ref 30.0–36.0)
MCV: 89 fl (ref 78.0–100.0)
Monocytes Absolute: 0.4 10*3/uL (ref 0.1–1.0)
Monocytes Relative: 5.5 % (ref 3.0–12.0)
Neutro Abs: 3.3 10*3/uL (ref 1.4–7.7)
Neutrophils Relative %: 50.6 % (ref 43.0–77.0)
Platelets: 244 10*3/uL (ref 150.0–400.0)
RBC: 5.02 Mil/uL (ref 4.22–5.81)
RDW: 13.9 % (ref 11.5–15.5)
WBC: 6.4 10*3/uL (ref 4.0–10.5)

## 2023-11-27 LAB — COMPREHENSIVE METABOLIC PANEL WITH GFR
ALT: 17 U/L (ref 0–53)
AST: 12 U/L (ref 0–37)
Albumin: 3.8 g/dL (ref 3.5–5.2)
Alkaline Phosphatase: 60 U/L (ref 39–117)
BUN: 12 mg/dL (ref 6–23)
CO2: 30 meq/L (ref 19–32)
Calcium: 9.2 mg/dL (ref 8.4–10.5)
Chloride: 103 meq/L (ref 96–112)
Creatinine, Ser: 0.98 mg/dL (ref 0.40–1.50)
GFR: 80.9 mL/min (ref >60.00)
Glucose, Bld: 99 mg/dL (ref 70–99)
Potassium: 4.3 meq/L (ref 3.5–5.1)
Sodium: 140 meq/L (ref 135–145)
Total Bilirubin: 0.6 mg/dL (ref 0.2–1.2)
Total Protein: 6.8 g/dL (ref 6.0–8.3)

## 2023-11-27 MED ORDER — LOSARTAN POTASSIUM 25 MG PO TABS: 25.0000 mg | ORAL_TABLET | Freq: Every day | ORAL | 3 refills | Status: AC

## 2023-11-27 NOTE — Patient Instructions (Addendum)
 Trey Paula,  Thank you for taking time for your medical wellness visit I appreciate your ongoing commitment to your health goals please review the following plan we discussed and let me know if you need assistance.  These of the goals as we discussed  General Health Maintenance Pneumonia vaccine status unclear. Declined hepatitis C screening. COVID-19 vaccine discussed. - Confirm pneumonia vaccine type and location with [employer] or [employer] and update records. - Discuss COVID-19 vaccine timing and leave decision to him.  Goals of Care Advanced directive paperwork filled but not notarized. Plan to ask if our office can notarize Advance directive - Check availability of in-office notary for advanced directive paperwork.   -If recurrent falls occur let me know.    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 Under care of new surgeon for non-surgical wound closure. Expected healing without surgery and fitting for new prosthesis.  Hypertension Blood pressure at 145/80 mmHg. Restarting losartan to achieve target range of 130-135 mmHg. - Prescribe losartan 25 mg, 90 tablets with three refills. - Instruct to start checking blood pressure daily in a week and send an iTrap message with readings.  Thyroid Nodule Large category four thyroid nodule, benign on biopsy. Follow-up with ENT scheduled.  Fatigue Mild fatigue, potential thyroid dysfunction considered. - Order TSH and T4. - Order CBC. - Repeat metabolic panel.  Follow up date to be determined after lab review and bp update

## 2023-11-27 NOTE — Progress Notes (Signed)
 Subjective:    Andres [name] is a 68 y.o. male who presents for a Welcome to Medicare exam.    Andres [name] "Andres [name]" is a 68 year old male who presents for a preventative exam and follow-up on chronic issues.   He was previously on losartan 25 mg until March 2024 but had to stop due to needing an appointment for refills. No side effects from the medication were noted. He has not been on any blood pressure medication since stopping losartan. BP has been 145-150 systolic since ran out of med.  He experienced a fall on January 1st while walking his dog, resulting in a leg injury. He was knocked over by another dog, leading to a torn knee and a wound at the base of his prosthetic limb. He was hospitalized for a day due to an infection and is currently under the care of a new surgeon in [place] for wound management. The injury is being treated non-surgically, and he anticipates needing a new prosthesis soon. The fall was related to a worker's compensation injury.  He experiences mild fatigue and has not had recent thyroid studies. He recalls a previous ultrasound showing a large category four thyroid nodule, which was biopsied and found to be benign. He has an upcoming appointment with an ENT for follow-up.  He mentions receiving a pneumonia vaccine, possibly at [employer], but is unsure of the details. He plans to confirm the information with his wife, who may have a record of it. He reports walking his dog daily for one to two hours as his primary form of exercise. No recent falls aside from the incident in January.      Objective:    See vitals  Medications Outpatient Encounter Medications as of 11/27/2023  Medication Sig   Ascorbic Acid (VITAMIN C PO) Take 1 tablet by mouth in the morning.   Cholecalciferol (VITAMIN D-3 PO) Take 1 tablet by mouth every evening.   doxycycline (VIBRA-TABS) 100 MG tablet Take 1 tablet (100 mg total) by mouth 2 (two) times daily.   Omega-3  Fatty Acids (FISH OIL PO) Take 1 capsule by mouth every evening.   Thiamine HCl (VITAMIN B-1 PO) Take 1 tablet by mouth in the morning.   No facility-administered encounter medications on file as of 11/27/2023.     History: Past Medical History:  Diagnosis Date   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HCC) 10/20/2020   Abscess of right lower leg    Accelerated hypertension 11/08/2018   Acute blood loss anemia    Allergic rhinitis 10/24/2011   Basal cell carcinoma    Basal cell carcinoma of back 07/10/2011   Cellulitis 10/05/2020   Cellulitis of right lower extremity 10/05/2020   Chest pain 11/08/2018   COVID    COVID-19 virus infection 10/05/2020   Drug induced constipation    Encounter to establish care 07/09/2017   Essential hypertension 08/16/2015   Chronic, not well controlled; I advised patient to increase losartan however he wants to work on diet and exercise more first - cont losartan 25mg  daily - check Bps and record - if Bp's continue to trend up patient to go up to 50mg  of losartan - check BMP today - Signs and symptoms of hypertensive crisis reviewed with patient, included but not limited to, worsening sudden onset headache, blurry vi   History of nonmelanoma skin cancer 07/15/2012   Hypertension    Hyponatremia    Impaired fasting glucose 08/23/2014  Chronic, last A1c was 6.0 - will recheck today   Necrotizing fasciitis of lower leg (HCC)    Obesity (BMI 30-39.9) 08/23/2014   Post-operative pain    Prediabetes    Reactive depression    Sensorineural hearing loss (SNHL), bilateral 08/16/2015   Unilateral AKA, right [facility])    Past Surgical History:  Procedure Laterality Date   AMPUTATION Right 10/15/2020   Procedure: AMPUTATION ABOVE KNEE;  Surgeon: Nadara Mustard, MD;  Location: [facility] OR;  Service: Orthopedics;  Laterality: Right;   FASCIOTOMY Right 10/11/2020   Procedure: RIGHT LEG FASCIOTOMIES AND DEBRIDE ULCER;  Surgeon: Nadara Mustard, MD;  Location: [facility] OR;  Service:  Orthopedics;  Laterality: Right;   I & D EXTREMITY Right 10/13/2020   Procedure: REPEAT DEBRIDEMENT RIGHT LEG;  Surgeon: Nadara Mustard, MD;  Location: [facility] OR;  Service: Orthopedics;  Laterality: Right;    Family History  Family history unknown: Yes   Social History   Occupational History   Not on file  Tobacco Use   Smoking status: Former   Smokeless tobacco: Never  Vaping Use   Vaping status: Never Used  Substance and Sexual Activity   Alcohol use: Not Currently   Drug use: Never   Sexual activity: Yes    Tobacco Counseling Counseling given: Not Answered   Immunizations and Health Maintenance Immunization History  Administered Date(s) Administered   Influenza-Unspecified 06/21/2021, 06/06/2022   Tdap 09/02/2009, 10/05/2020   Zoster Recombinant(Shingrix) 10/10/2021, 02/07/2022   Health Maintenance Due  Topic Date Due   COVID-19 Vaccine (1) Never done   Hepatitis C Screening  Never done   Pneumonia Vaccine 47+ Years old (1 of 1 - PCV) Never done    Activities of Daily Living    11/26/2023    5:14 PM 11/14/2023   11:50 AM  In your present state of health, do you have any difficulty performing the following activities:  Hearing? 0 0  Vision? 0 0  Difficulty concentrating or making decisions? 0 0  Walking or climbing stairs? 0 0  Dressing or bathing? 0 0  Doing errands, shopping? 0 0  Preparing Food and eating ? N N  Using the Toilet? N N  In the past six months, have you accidently leaked urine? N N  Do you have problems with loss of bowel control? N N  Managing your Medications? N N  Managing your Finances? N N  Housekeeping or managing your Housekeeping? N N    Physical Exam   Physical Exam (optional), or other factors deemed appropriate based on the beneficiary's medical and social history and current clinical standards.  General Mental Status- Alert. General Appearance- Not in acute distress.   Skin Scattered sk lesion on back. Pt followed by  der  Neck Carotid Arteries- Normal color. Moisture- Normal Moisture. No carotid bruits. No JVD.  Chest and Lung Exam Auscultation: Breath Sounds:-Normal.  Cardiovascular Auscultation:Rythm- Regular. Murmurs & Other Heart Sounds:Auscultation of the heart reveals- No Murmurs.  Abdomen Inspection:-Inspeection Normal. Palpation/Percussion:Note:No mass. Palpation and Percussion of the abdomen reveal- Non Tender, Non Distended + BS, no rebound or guarding.    Neurologic Cranial Nerve exam:- CN III-XII intact(No nystagmus), symmetric smile. Strength:- 5/5 equal and symmetric strength both upper and lower extremities.   Rt lower ext- amputation.   Advanced Directives: pt has filled out and needs notarized.     EKG:  normal EKG, normal sinus rhythm, unchanged from previous tracings, normal sinus rhythm     Assessment:  This is a routine wellness  examination for this patient .    Patient Instructions  Andres [name],  Thank you for taking time for your medical wellness visit I appreciate your ongoing commitment to your health goals please review the following plan we discussed and let me know if you need assistance.  These of the goals as we discussed  General Health Maintenance Pneumonia vaccine status unclear. Declined hepatitis C screening. COVID-19 vaccine discussed. - Confirm pneumonia vaccine type and location with [employer] or [employer] and update records. - Discuss COVID-19 vaccine timing and leave decision to him.  Goals of Care Advanced directive paperwork filled but not notarized. Plan to ask if our office can notarize Advance directive - Check availability of in-office notary for advanced directive paperwork.   -If recurrent falls occur let me know.    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 Under care of new surgeon for non-surgical wound closure. Expected healing without surgery and fitting for new  prosthesis.  Hypertension Blood pressure at 145/80 mmHg. Restarting losartan to achieve target range of 130-135 mmHg. - Prescribe losartan 25 mg, 90 tablets with three refills. - Instruct to start checking blood pressure daily in a week and send an iTrap message with readings.  Thyroid Nodule Large category four thyroid nodule, benign on biopsy. Follow-up with ENT scheduled.  Fatigue Mild fatigue, potential thyroid dysfunction considered. - Order TSH and T4. - Order CBC. - Repeat metabolic panel.  Follow up date to be determined after lab review and bp update     Vision/Hearing screen Pt just had visual acuity 2 weeks ago and got new rx thru optometrist   Goals      patient is stable at present. He has case manager who is monitoring his medical needs. He has Worker's Sport and exercise psychologist.     Care Coordination Interventions:  Solution-Focused Strategies employed:  Active listening / Reflection utilized  Described Care Coordination program support to Genice Rouge, spouse of client Reviewed current client needs. He has AKA. He has case manager monitoring his care needs. He has Financial risk analyst. He has support of his wife. Massie Bougie wrote down name and phone number of LCSW and wrote down name of program support. She appreciated call and will keep information to use if needed in the future for client          Depression Screen    10/09/2022    9:34 AM 10/16/2021    8:25 AM  PHQ 2/9 Scores  PHQ - 2 Score 0 0     Fall Risk    11/26/2023    5:14 PM  Fall Risk   Falls in the past year? 1  Number falls in past yr: 0  Injury with Fall? 1    Cognitive Function    MM 30 and C6 score 0.    Patient Care Team: Alyssah Algeo, Kateri Mc as PCP - General (Internal Medicine)     Plan:     I have personally reviewed and noted the following in the patient's chart:   Medical and social history Use of alcohol, tobacco or illicit drugs  Current medications and  supplements Functional ability and status Nutritional status Physical activity Advanced directives List of other physicians Hospitalizations, surgeries, and ER visits in previous 12 months Vitals Screenings to include cognitive, depression, and falls Referrals and appointments  In addition, I have reviewed and discussed with patient certain preventive protocols, quality metrics, and best practice recommendations. A written personalized care  plan for preventive services as well as general preventive health recommendations were provided to patient.     Esperanza Richters PA-C  [phone] charge as did address htn and fatigue

## 2023-11-28 ENCOUNTER — Encounter: Payer: Self-pay | Admitting: Medical

## 2023-11-28 LAB — PSA: PSA: 5.98 ng/mL — ABNORMAL HIGH (ref 0.10–4.00)

## 2023-11-28 LAB — T4, FREE: Free T4: 0.67 ng/dL (ref 0.60–1.60)

## 2023-11-28 LAB — TSH: TSH: 1.62 u[IU]/mL (ref 0.35–5.50)

## 2023-11-28 NOTE — Telephone Encounter (Signed)
 Immunizations updated.

## 2023-12-16 DIAGNOSIS — R221 Localized swelling, mass and lump, neck: Secondary | ICD-10-CM | POA: Diagnosis not present

## 2023-12-16 DIAGNOSIS — E041 Nontoxic single thyroid nodule: Secondary | ICD-10-CM | POA: Diagnosis not present

## 2023-12-23 DIAGNOSIS — E041 Nontoxic single thyroid nodule: Secondary | ICD-10-CM | POA: Diagnosis not present

## 2023-12-23 DIAGNOSIS — R221 Localized swelling, mass and lump, neck: Secondary | ICD-10-CM | POA: Diagnosis not present

## 2024-01-07 ENCOUNTER — Other Ambulatory Visit: Payer: Self-pay | Admitting: Urology

## 2024-01-07 DIAGNOSIS — R972 Elevated prostate specific antigen [PSA]: Secondary | ICD-10-CM

## 2024-02-16 ENCOUNTER — Ambulatory Visit
Admission: RE | Admit: 2024-02-16 | Discharge: 2024-02-16 | Disposition: A | Discharge status: Home / Self Care | Source: Ambulatory Visit | Attending: Urology | Admitting: Urology

## 2024-02-16 DIAGNOSIS — R972 Elevated prostate specific antigen [PSA]: Secondary | ICD-10-CM

## 2024-02-16 DIAGNOSIS — K5730 Diverticulosis of large intestine without perforation or abscess without bleeding: Secondary | ICD-10-CM | POA: Diagnosis not present

## 2024-02-16 MED ORDER — GADOPICLENOL 0.5 MMOL/ML IV SOLN: 10.0000 mL | Freq: Once | INTRAVENOUS | Status: AC | PRN

## 2024-02-17 DIAGNOSIS — E049 Nontoxic goiter, unspecified: Secondary | ICD-10-CM | POA: Diagnosis not present

## 2024-03-29 DIAGNOSIS — E041 Nontoxic single thyroid nodule: Secondary | ICD-10-CM | POA: Diagnosis not present

## 2024-05-07 DIAGNOSIS — E041 Nontoxic single thyroid nodule: Secondary | ICD-10-CM | POA: Diagnosis not present

## 2024-05-07 DIAGNOSIS — Z79899 Other long term (current) drug therapy: Secondary | ICD-10-CM | POA: Diagnosis not present

## 2024-05-07 DIAGNOSIS — I10 Essential (primary) hypertension: Secondary | ICD-10-CM | POA: Diagnosis not present

## 2024-05-07 DIAGNOSIS — E0789 Other specified disorders of thyroid: Secondary | ICD-10-CM | POA: Diagnosis not present

## 2024-05-08 DIAGNOSIS — I10 Essential (primary) hypertension: Secondary | ICD-10-CM | POA: Diagnosis not present

## 2024-05-08 DIAGNOSIS — E041 Nontoxic single thyroid nodule: Secondary | ICD-10-CM | POA: Diagnosis not present

## 2024-05-08 DIAGNOSIS — Z79899 Other long term (current) drug therapy: Secondary | ICD-10-CM | POA: Diagnosis not present

## 2024-06-02 DIAGNOSIS — E041 Nontoxic single thyroid nodule: Secondary | ICD-10-CM | POA: Diagnosis not present

## 2024-07-05 ENCOUNTER — Encounter: Payer: Self-pay | Admitting: Radiology

## 2024-11-29 ENCOUNTER — Encounter: Admitting: Medical
